# Patient Record
Sex: Female | Born: 1970 | Race: White | Hispanic: No | Marital: Married
Health system: Midwestern US, Academic
[De-identification: ages and names within clinical notes are randomized; demographics above are authoritative.]

---

## 2017-01-30 ENCOUNTER — Encounter: Admit: 2017-01-30 | Discharge: 2017-01-30 | Payer: Commercial Managed Care - HMO

## 2017-01-30 DIAGNOSIS — R0600 Dyspnea, unspecified: ICD-10-CM

## 2017-01-30 DIAGNOSIS — N3010 Interstitial cystitis (chronic) without hematuria: ICD-10-CM

## 2017-01-30 DIAGNOSIS — R51 Headache: ICD-10-CM

## 2017-01-30 DIAGNOSIS — I10 Essential (primary) hypertension: Principal | ICD-10-CM

## 2017-01-30 DIAGNOSIS — K859 Acute pancreatitis without necrosis or infection, unspecified: ICD-10-CM

## 2017-01-30 DIAGNOSIS — J302 Other seasonal allergic rhinitis: ICD-10-CM

## 2017-01-30 DIAGNOSIS — E7800 Pure hypercholesterolemia, unspecified: ICD-10-CM

## 2017-01-30 DIAGNOSIS — Z720 Tobacco use: ICD-10-CM

## 2017-01-30 MED ORDER — CLONAZEPAM 1 MG PO TAB
1 mg | ORAL_TABLET | ORAL | 5 refills | Status: AC | PRN
Start: 2017-01-30 — End: 2017-07-31

## 2017-01-30 MED ORDER — CLONAZEPAM 0.5 MG PO TAB
.75 mg | ORAL_TABLET | Freq: Two times a day (BID) | ORAL | 5 refills | Status: AC
Start: 2017-01-30 — End: 2017-07-31

## 2017-01-30 NOTE — Progress Notes
Date of Service: 01/30/2017    Subjective:             Christina Garrison is a 46 y.o. female who returns for vertigo and headache    Had occipital blocks in February, still has pain in the left GOC still from the injection.    Doing acupuncture every Saturday, this has helped.    Still having episodes of vertigo, triggered by heat    Taking clonazepam 1mg  in the AM, 1 PM, and sometimes 0.5mg  at night.  This is helping.    Hasn't had vestibular therapy    Allergies have also been bad this year for her, working on finding on decongestant therapies    Scheduled for lumbar radiofrequency ablation     History of Present Illness     Review of Systems   Constitutional: Positive for appetite change.   HENT: Positive for congestion, rhinorrhea, sinus pressure and sore throat.    Eyes: Positive for photophobia.   Endocrine: Positive for heat intolerance.   Genitourinary: Positive for flank pain.   Musculoskeletal: Positive for back pain, neck pain and neck stiffness.   Allergic/Immunologic: Positive for environmental allergies.   Neurological: Positive for light-headedness and headaches.   Psychiatric/Behavioral: Positive for agitation. The patient is nervous/anxious.    All other systems reviewed and are negative.        Objective:         ??? Biotin 10,000 mcg cap Take  by mouth twice daily.   ??? brexpiprazole 1 mg tab Take 1 Tab by mouth daily.   ??? buPROPion (WELLBUTRIN) 75 mg tablet Take 75 mg by mouth twice daily.   ??? BYSTOLIC 5 mg tablet TAKE 1 TAB BY MOUTH DAILY.   ??? clonazePAM (KLONOPIN) 0.5 mg tablet Take 1.5 tablets by mouth twice daily.   ??? clonazePAM (KLONOPIN) 1 mg tablet Take 1 tablet by mouth as Needed (For driving).   ??? divalproex (DEPAKOTE SPRINKLE) 125 mg capsule Take 250 mg by mouth twice daily.   ??? ezetimibe (ZETIA) 10 mg tablet TAKE 1 TAB BY MOUTH DAILY.   ??? mirtazapine (REMERON) 15 mg tablet Take 15 mg by mouth at bedtime daily.   ??? MULTIVITAMINS WITH IRON (MULTIVITAMIN WITH IRON PO) Take 1 Tab by mouth twice daily.   ??? omeprazole DR(+) (PRILOSEC) 20 mg capsule Take 20 mg by mouth as Needed.   ??? prochlorperazine maleate (COMPAZINE) 10 mg tablet Take 5-10mg  compazine + benadryl 25mg  + tylenol every 6 hours for acute headache or dizziness   ??? rosuvastatin (CRESTOR) 20 mg tablet TAKE 1 TAB BY MOUTH DAILY.   ??? rosuvastatin (CRESTOR) 40 mg tablet Take 40 mg by mouth daily.   ??? tiZANidine (ZANAFLEX) 2 mg tablet TAKE 2 TABLETS BY MOUTH AT BEDTIME AS NEEDED     Vitals:    01/30/17 1110   BP: 129/85   Pulse: 69   Weight: 106.6 kg (235 lb)   Height: 162.6 cm (64)     Body mass index is 40.34 kg/m???.     Physical Exam    Constitutional: She appears well-developed and well-nourished.   HENT:   Head: Normocephalic and atraumatic.   Eyes: Conjunctivae are normal.   Cardiovascular: Normal rate, regular rhythm and normal heart sounds.    Pulmonary/Chest: Effort normal.   Musculoskeletal:   Cervical paraspinal tenderness, GOC tenderness   Skin: Skin is warm and dry.   Psychiatric: She has a normal mood and affect. Her behavior is normal.  Vitals reviewed.  ???  Neurological:  Mental status: Patient is alert and oriented to time, place, person and situation.  Speech: Fluent, with normal naming, comprehension, articulation and repetition  CN II-XII: Visual fields intact to confrontation, PERRL (4->2), EOMI with multiple trials with some left sided nystagmus that fatigues but symptomatically worsens , facial sensation intact. Symmetrical facial movement. Hearing grossly intact to finger rub. Strong cough, elevates palate, uvula midline. Strong shoulder shrug. Tongue midline.  Motor: (R/L) b/l UE/LE 5/5. Normal tone and bulk. No abnormal movement, fasciculation or pronator drift.  ???  ??? SA  EF  EE  WE  WF  FF  FA  TA  HF  KF  KE  DF  PF  TF  TE    R  5  5  5  5  5  5  5  5  5  5  5  5  5  5  5     L  5  5  5  5  5  5  5  5  5  5  5  5  5  5  5     ???  Sensory: intact light touch.  Without sensory level. Romberg sign absent. Reflexes: (R/L) Bj, Trj, Brj, Knj, Aj. No clonus, hofmann, cross adductor. Babinski negative.  ???  ??? Right Left   Triceps 1 1   Biceps 1 1   Brachioradialis 1 1   Patella 1 1   Ankle 1 1   Plantar flexor flexor   ???  Coordination/ fine movement: normal finger to nose with eyes closed.  Gait: normal. Patient able to walk on heels, toes and in tandem (no significant difficulty with tandem gait)       Assessment and Plan:      Problem   Vertigo    Spells for years of intermittent vertigo, initially positionally provoked, now lasting for only seconds.  Occurs daily currently.  Initially with feeling of L ear fullness and pain.  Has been associated with headaches as well  Does have a h/o falling down stairs hitting her head and neck 20 years ago  MRI wo/w + IAC 10/2012 without significant abnormalities  Neuro-otology workup with likely central cause of dizziness (normal audiogram and mostly normal VEMPs, normal electronystagmography, no canal dehiscence on imaging)  Has undergone PT for vestibular and cervical causes    Current treatment: Clonazepam 1mg  qAM, qPM and 0.5mg  qHS- this has helped, Depakote (for mood), Verapamil (has had some response), Acupuncture (has had some response)    Prior trials: Prozac (SE), Effexor (suicidal thoughts), Topamax (speech difficulties), nortriptyline, Indomethacin (helped headache but GI SE), Magnesium/Riboflavin/coQ10, Propranolol (changed to Bystolic).  Occipital block with poor tolerance due to lasting pain at injection site     Tension Headache    frontal/L eye pressure since her 30s with conjunctival injection/photo/phono.  Able to work through this without any medication.  Sleeps in positions that provoke neck/back pain.  Significant greater occipital tenderness on exam  Improved with treatment of OSA, and after occipital nerve blocks (though poorly tolerated as above)  History of frontal meningocele (resected), head trauma (fell down stairs)    Meds described as above Vertigo  Will continue Clonazepam at current dose, and will refer to vestibular therapy.  Has RFA for low back pain coming up, hopefully this will help her back pain, which will help her sleep, which will help multiple somatic complaints.      RTC in 6 months  Pt seen by and discussed with Dr Genevie Cheshire

## 2017-01-31 ENCOUNTER — Ambulatory Visit: Admit: 2017-01-30 | Discharge: 2017-01-31 | Payer: Commercial Managed Care - HMO

## 2017-01-31 DIAGNOSIS — G44209 Tension-type headache, unspecified, not intractable: Secondary | ICD-10-CM

## 2017-01-31 DIAGNOSIS — R42 Dizziness and giddiness: Principal | ICD-10-CM

## 2017-01-31 NOTE — Assessment & Plan Note
Will continue Clonazepam at current dose, and will refer to vestibular therapy.  Has RFA for low back pain coming up, hopefully this will help her back pain, which will help her sleep, which will help multiple somatic complaints.

## 2017-02-02 ENCOUNTER — Encounter: Admit: 2017-02-02 | Discharge: 2017-02-02 | Payer: Commercial Managed Care - HMO

## 2017-02-05 NOTE — Progress Notes
I have personally interviewed and examined Christina Garrison and reviewed the history, examination, impression, and plan of care as outlined by Dr.Trevor Criselda Peaches, MD. I personally  participated in patient counseling and coordination of care.  I agree with the assessment and plan as documented by Dr. Siri Cole, MD.

## 2017-03-09 ENCOUNTER — Encounter: Admit: 2017-03-09 | Discharge: 2017-03-09 | Payer: Commercial Managed Care - HMO

## 2017-03-09 ENCOUNTER — Ambulatory Visit: Admit: 2017-03-09 | Discharge: 2017-03-10 | Payer: Commercial Managed Care - HMO

## 2017-03-09 DIAGNOSIS — K859 Acute pancreatitis without necrosis or infection, unspecified: ICD-10-CM

## 2017-03-09 DIAGNOSIS — I10 Essential (primary) hypertension: ICD-10-CM

## 2017-03-09 DIAGNOSIS — J302 Other seasonal allergic rhinitis: ICD-10-CM

## 2017-03-09 DIAGNOSIS — N3010 Interstitial cystitis (chronic) without hematuria: ICD-10-CM

## 2017-03-09 DIAGNOSIS — E7800 Pure hypercholesterolemia, unspecified: ICD-10-CM

## 2017-03-09 DIAGNOSIS — R0600 Dyspnea, unspecified: ICD-10-CM

## 2017-03-09 DIAGNOSIS — Z720 Tobacco use: ICD-10-CM

## 2017-03-09 DIAGNOSIS — R51 Headache: ICD-10-CM

## 2017-03-09 DIAGNOSIS — M5137 Other intervertebral disc degeneration, lumbosacral region: Secondary | ICD-10-CM

## 2017-03-09 DIAGNOSIS — M5416 Radiculopathy, lumbar region: Principal | ICD-10-CM

## 2017-03-09 MED ORDER — BUPIVACAINE (PF) 0.5 % (5 MG/ML) IJ SOLN
6 mL | Freq: Once | INTRAMUSCULAR | 0 refills | Status: CP
Start: 2017-03-09 — End: ?
  Administered 2017-03-09: 19:00:00 6 mL via INTRAMUSCULAR

## 2017-03-09 MED ORDER — BUPIVACAINE (PF) 0.25 % (2.5 MG/ML) IJ SOLN
6 mL | Freq: Once | INTRAMUSCULAR | 0 refills | Status: DC
Start: 2017-03-09 — End: 2017-03-09

## 2017-03-09 NOTE — Progress Notes
[facility]  INTERVENTIONAL PAIN PROCEDURE HISTORY AND PHYSICAL    Chief Complaint   Patient presents with   ??? Lower Back - Pain   ??? Procedure     MBB#1       HISTORY OF PRESENT ILLNESS:  LBP, axial. No radicular pain.    Patient denies fevers, chills, infection, bleeding issues, anticoagulants, new muscle weakness, numbness, tingling, bowel/bladder incontinence, or saddle anesthesia.      Past Medical History:   Diagnosis Date   ??? Dyspnea    ??? Generalized headaches    ??? High cholesterol    ??? HTN (hypertension)    ??? Hypertension 11/21/2010   ??? Interstitial cystitis    ??? Pancreatitis 11/21/2010   ??? Seasonal allergic reaction    ??? Tobacco abuse        Past Surgical History:   Procedure Laterality Date   ??? SINUS SURGERY  2004   ??? PR LAPS GSTRC RSTRICTIV PX LONGITUDINAL GASTRECTOMY  03/2014   ??? ELECTROCARDIOGRAM     ??? HX APPENDECTOMY     ??? HX CHOLECYSTECTOMY     ??? HX PARTIAL HYSTERECTOMY         family history includes Allergy-severe in her mother; Asthma in her mother; Cancer (age of onset: 51) in her sister; Coronary Artery Disease in her father and mother; Diabetes in her mother and sister; Heart Attack in her father and mother; Heart Disease (age of onset: 42) in her sister; High Cholesterol in her father, mother, and sister; Hypertension in her brother, father, mother, and sister; Tumor in her sister; Tumor (age of onset: 51) in her sister.    Social History     Social History   ??? Marital status: Married     Spouse name: N/A   ??? Number of children: N/A   ??? Years of education: N/A     Occupational History   ??? Not on file.     Social History Main Topics   ??? Smoking status: Former Smoker     Packs/day: 0.50     Years: 25.00     Types: Cigarettes     Quit date: 05/02/2012   ??? Smokeless tobacco: Never Used      Comment: Cessation Counseling given 11/29/10   ??? Alcohol use No   ??? Drug use: No   ??? Sexual activity: Not Currently     Birth control/ protection: Surgical     Other Topics Concern   ??? Not on file Social History Narrative   ??? No narrative on file       Allergies   Allergen Reactions   ??? Diovan [Valsartan] HIVES   ??? Effexor [Venlafaxine] SEE COMMENTS     Suicidal thoughts   ??? Prozac [Fluoxetine] NAUSEA AND VOMITING and ANXIETY   ??? Topamax [Topiramate] SEE COMMENTS     Feels like she is going to pass out and speech messed up       Vitals:    03/09/17 1301   BP: 135/84   Pulse: 95   Resp: 16   Temp: 36.5 ???C (97.7 ???F)   TempSrc: Oral   SpO2: 97%   Weight: 81.6 kg (180 lb)   Height: 162.6 cm (64)       REVIEW OF SYSTEMS: 10 point ROS obtained and negative       PHYSICAL EXAM:    General: The patient is a well-developed, overweight 46 y.o. female in no acute distress.   HEENT: Head is normocephalic and atraumatic.  Pupils are equal and reactive to light bilaterally.   Cardiac: Based on palpation, pulse appears to be regular rate and rhythm.   Pulmonary: The patient has unlabored respirations and bilateral symmetric chest excursion.   Abdomen: Soft, nontender, and nondistended. There is no rebound or guarding.   Extremities: No clubbing, cyanosis, or edema.     Neurologic:   The patient is alert and oriented times 3.     Musculoskeletal:     L-Spine   There is mod low lumbar paraspinal tenderness. Paraspinal muscle tone is increased.  Facet loading is positive.  ROM with flexion, extension, rotation, and lateral bending is intact.  SLR is negative bilaterally.        IMPRESSION:    1. Spondylosis of lumbosacral region without myelopathy or radiculopathy    2. DDD (degenerative disc disease), lumbosacral    3. Facet arthropathy (HCC)         PLAN: Other Bilateral L3-5 MBB

## 2017-03-09 NOTE — Procedures
Attending Surgeon: Evelina Bucy, MD    Anesthesia: Local    Pre-Procedure Diagnosis:   1. Spondylosis of lumbosacral region without myelopathy or radiculopathy    2. DDD (degenerative disc disease), lumbosacral    3. Facet arthropathy (HCC)        Post-Procedure Diagnosis:   1. Spondylosis of lumbosacral region without myelopathy or radiculopathy    2. DDD (degenerative disc disease), lumbosacral    3. Facet arthropathy (HCC)        MBB/Facet LMBR/SAC  Procedure: medial branch block    Laterality: bilateral  Location: lumbar - L3-4, L4-5 and L5-S1      Consent:   Consent obtained: written  Consent given by: patient  Risks discussed: allergic reaction, bleeding, bruising, infection, never damage, no change or worsening in pain, pneumo thorax, reaction to medication, seizure, swelling and weakness  Alternatives discussed: alternative treatment, delayed treatment and no treatment  Discussed with patient the purpose of the treatment/procedure, other ways of treating my condition, including no treatment/ procedure and the risks and benefits of the alternatives. Patient has decided to proceed with treatment/procedure.        Universal Protocol:  Relevant documents: relevant documents present and verified  Site marked: the operative site was marked  Patient identity confirmed: Patient identify confirmed verbally with patient.      Time out: Immediately prior to procedure a time out was called to verify the correct patient, procedure, equipment, support staff and site/side marked as required      Procedures Details:   Indications: pain   Prep: chlorhexidine  Patient position: prone  Estimated Blood Loss: minimal  Specimens: none  Number of Levels: 3  Guidance: fluoroscopy  Needle and Epidural Catheter: quincke  Needle size: 25 G  Injection procedure: Incremental injection and Negative aspiration for blood  Patient tolerance: Patient tolerated the procedure well with no immediate complications. Pressure was applied, and hemostasis was accomplished.  Outcome: Pain unchanged  Comments: LUMBAR MEDIAL BRANCH BLOCKS UNDER FLUOROSCOPY    PROCEDURE:  1) bilateral L3-L5 medial branch blocks   2) Fluoroscopic needle guidance    REASON FOR PROCEDURE:     Spondylosis without myelopathy or radiculopathy, DDD (lumbosacral), Lumbar arthropathy, Facet arthropathy, and axial back pain    PHYSICIAN: Evelina Bucy, MD    MEDICATIONS INJECTED: Bupivacaine 0.5% (0.7 ml each level)    LOCAL ANESTHETIC INJECTED: 0.5 mL of 1% lidocaine per site    SEDATION MEDICATIONS: None    ESTIMATED BLOOD LOSS: None    SPECIMENS REMOVED: None    COMPLICATIONS: None    TECHNIQUE: Time-out was taken to identify the correct patient, procedure and side prior to starting the procedure. Lying in a prone position, the patient was prepped and draped in the usual sterile fashion using DuraPrep and a fenestrated drape. Each site was identified under fluoroscopy. Local anesthetic was given by raising a wheal and going down to the hub of a 27-gauge 1.25-inch needle. The 25-gauge 3.5-inch Quincke needle was advanced to the anatomic location of each medial branch at the junction of the superior articular process and transverse process utilizing intermittent fluoroscopy. Medication was then injected slowly.    The procedure was completed without complications and was tolerated well. The patient was monitored after the procedure. The patient (or responsible party) was given post-procedure and  discharge instructions to follow at home. The patient was discharged in stable condition.     Note: The patient has been instructed to call to inform us what percentage  of pain relief was obtained after the facet nerve blocks from today. A pain diary was provided. The patient was also instructed to do the activities that would normally worsen the pain.           Estimated blood loss: none or minimal  Specimens: none Patient tolerated the procedure well with no immediate complications. Pressure was applied, and hemostasis was accomplished.

## 2017-03-10 ENCOUNTER — Encounter: Admit: 2017-03-10 | Discharge: 2017-03-10 | Payer: Commercial Managed Care - HMO

## 2017-03-10 DIAGNOSIS — I10 Essential (primary) hypertension: ICD-10-CM

## 2017-03-10 DIAGNOSIS — E7800 Pure hypercholesterolemia, unspecified: ICD-10-CM

## 2017-03-10 DIAGNOSIS — M4690 Unspecified inflammatory spondylopathy, site unspecified: ICD-10-CM

## 2017-03-10 DIAGNOSIS — M47817 Spondylosis without myelopathy or radiculopathy, lumbosacral region: Principal | ICD-10-CM

## 2017-03-10 DIAGNOSIS — Z87891 Personal history of nicotine dependence: ICD-10-CM

## 2017-03-10 DIAGNOSIS — M5137 Other intervertebral disc degeneration, lumbosacral region: ICD-10-CM

## 2017-03-11 NOTE — Telephone Encounter
Patient called to report pain diary status post Facet Joint Injection  MBB L3-5 #1    Prior to procedure 5/10  Location: lower back  Procedure completed approximately 1500 (per patient report)    Pre-procedure pain level returned at next day    Experienced 80% of relief for 4 hours in lower back (location). Pain above injection site did not improve--reviewed use of MBBs. May need to focus on this area of pain after lower back pain.     Also, the patient reports that the numbing medicine doesn't seem to work on her left side which was similar to how she felt when having trigger point injections. The right side was numb both times, but she had trouble with the injection sites on the left side only.   She enjoyed how the "The nurses and doctor were very nice, professional and helpful."

## 2017-03-13 ENCOUNTER — Encounter: Admit: 2017-03-13 | Discharge: 2017-03-13 | Payer: Commercial Managed Care - HMO

## 2017-03-13 NOTE — Telephone Encounter
From: Janann Colonel [[email or web address]]   Sent: Friday, March 13, 2017 10:00 AM  To: Rexene Agent  Subject: [External] Lumbar Shots    Good morning Wells Guiles,    Not to be a nuisance,  but I am a little apprehensive about getting the 2nd round of shots on Monday.  My lower back actually feels more pronounced pressure more consistently whereas it would come and go and just be a dull ache most of the time.    I do not want to get another round of shots and it hurt worse for weeks or longer. (Like my head did)  I'm wondering if these past shots are causing discomfort, won't the 2nd round be the same or what am I to expect out of them?     All I want is some relief for my back.  I do not mind going through the steps to get it,  but I'd prefer to feel comfortable with the procedure and get some kind of relief as well.    Would it be possible to push the shots back to the week after next and let me see if my back starts easing up?  Also, if you have time to call after [phone] to discuss it would be appreciated.   I know its Friday and you may take off early on fridays.   If so, no worries and have a good weekend.      Thank you very much for hearing me out and for ALL your assistance.     Christina Garrison   1971-01-04  [phone] Phone

## 2017-03-16 ENCOUNTER — Encounter: Admit: 2017-03-16 | Discharge: 2017-03-16 | Payer: Commercial Managed Care - HMO

## 2017-03-16 DIAGNOSIS — M5416 Radiculopathy, lumbar region: Principal | ICD-10-CM

## 2017-04-06 ENCOUNTER — Encounter: Admit: 2017-04-06 | Discharge: 2017-04-06 | Payer: Commercial Managed Care - HMO

## 2017-04-06 ENCOUNTER — Ambulatory Visit: Admit: 2017-04-06 | Discharge: 2017-04-06 | Payer: Commercial Managed Care - HMO

## 2017-04-06 DIAGNOSIS — R51 Headache: ICD-10-CM

## 2017-04-06 DIAGNOSIS — Z720 Tobacco use: ICD-10-CM

## 2017-04-06 DIAGNOSIS — N3010 Interstitial cystitis (chronic) without hematuria: ICD-10-CM

## 2017-04-06 DIAGNOSIS — D485 Neoplasm of uncertain behavior of skin: Principal | ICD-10-CM

## 2017-04-06 DIAGNOSIS — E7800 Pure hypercholesterolemia, unspecified: ICD-10-CM

## 2017-04-06 DIAGNOSIS — I10 Essential (primary) hypertension: Principal | ICD-10-CM

## 2017-04-06 DIAGNOSIS — D229 Melanocytic nevi, unspecified: ICD-10-CM

## 2017-04-06 DIAGNOSIS — J302 Other seasonal allergic rhinitis: ICD-10-CM

## 2017-04-06 DIAGNOSIS — R0600 Dyspnea, unspecified: ICD-10-CM

## 2017-04-06 DIAGNOSIS — W891XXA Exposure to tanning bed, initial encounter: ICD-10-CM

## 2017-04-06 DIAGNOSIS — K859 Acute pancreatitis without necrosis or infection, unspecified: ICD-10-CM

## 2017-04-06 NOTE — Progress Notes
ATTESTATION    I personally performed the key portions of the E/M visit, discussed case with resident and concur with resident documentation of history, physical exam, assessment, and treatment plan unless otherwise noted.    I performed the key components of the excisional biopsy procedure including site identification, discussion with patient, excision type, choice, final closure, and post-procedure instructions and was present during the entire procedure.      Staff name:  Mechele Collin, MD Date:  04/06/2017

## 2017-04-06 NOTE — Progress Notes
Pathology specimen was collected and confirmed the requisition and specimen container are correct. Specimen placed in bag with requisition and sent to the pathology department. Arrow Emmerich, LPN

## 2017-04-07 NOTE — Progress Notes
Please send the benign biopsy letter. Thanks.

## 2017-04-07 NOTE — Progress Notes
Please send benign letter. Thank you.

## 2017-04-08 ENCOUNTER — Encounter: Admit: 2017-04-08 | Discharge: 2017-04-08 | Payer: Commercial Managed Care - HMO

## 2017-04-10 ENCOUNTER — Encounter: Admit: 2017-04-10 | Discharge: 2017-04-10 | Payer: Commercial Managed Care - HMO

## 2017-04-10 MED ORDER — MUPIROCIN 2 % TP OINT
Freq: Two times a day (BID) | TOPICAL | 0 refills | 11.00000 days | Status: AC
Start: 2017-04-10 — End: 2018-01-05

## 2017-04-10 NOTE — Progress Notes
Patient reported punch biopsy site on left upper back done on Monday was swollen and a little tender radiating to her shoulder. Offered patient a nurse visit, mupirocin ointment, or Doxycycline tablets for 5 days. Patient opted for the mupirocin ointment. Order for Mupirocin has been sent to patient's preferred pharmacy. Reminded patient that stitches will need to come out anytime from the 16th-20th (10-14 days from the biopsy date). Patient stated she took home a suture removal kit and will have someone take them out for her. Encouraged patient to call the clinic if she has any trouble getting stitches taken out or if she has any other concerns regarding the biopsy site. Celene Kras, LPN

## 2017-04-21 ENCOUNTER — Encounter: Admit: 2017-04-21 | Discharge: 2017-04-21 | Payer: Commercial Managed Care - HMO

## 2017-04-21 MED ORDER — DOXYCYCLINE HYCLATE 100 MG PO TAB
ORAL_TABLET | Freq: Two times a day (BID) | ORAL | 0 refills | 8.00000 days | Status: AC
Start: 2017-04-21 — End: 2017-05-25

## 2017-04-21 NOTE — Progress Notes
Patient in clinic today for suture removal to left upper back. Patient reported tenderness and has been applying mupirocin ointment. No drainage, warmth, or swelling noted. Vaseline and a bandaid applied. Patient tolerated well. She stated she would like to oral antibiotics. Reached out to Dr. Mina Marble regarding oral antibiotic and she prescribed Doxycycline 100mg  BID for 7 days. Order for Doxycycline has been sent to patient's preferred pharmacy and patient has been notified. Celene Kras, LPN

## 2017-05-25 ENCOUNTER — Ambulatory Visit: Admit: 2017-05-25 | Discharge: 2017-05-26 | Payer: Commercial Managed Care - HMO

## 2017-05-25 ENCOUNTER — Encounter: Admit: 2017-05-25 | Discharge: 2017-05-25 | Payer: Commercial Managed Care - HMO

## 2017-05-25 DIAGNOSIS — K859 Acute pancreatitis without necrosis or infection, unspecified: ICD-10-CM

## 2017-05-25 DIAGNOSIS — R51 Headache: ICD-10-CM

## 2017-05-25 DIAGNOSIS — J302 Other seasonal allergic rhinitis: ICD-10-CM

## 2017-05-25 DIAGNOSIS — M47819 Spondylosis without myelopathy or radiculopathy, site unspecified: ICD-10-CM

## 2017-05-25 DIAGNOSIS — M5416 Radiculopathy, lumbar region: Principal | ICD-10-CM

## 2017-05-25 DIAGNOSIS — I10 Essential (primary) hypertension: Secondary | ICD-10-CM

## 2017-05-25 DIAGNOSIS — R0600 Dyspnea, unspecified: ICD-10-CM

## 2017-05-25 DIAGNOSIS — Z720 Tobacco use: ICD-10-CM

## 2017-05-25 DIAGNOSIS — E7800 Pure hypercholesterolemia, unspecified: ICD-10-CM

## 2017-05-25 DIAGNOSIS — N3010 Interstitial cystitis (chronic) without hematuria: ICD-10-CM

## 2017-05-25 MED ORDER — BUPIVACAINE (PF) 0.5 % (5 MG/ML) IJ SOLN
6 mL | Freq: Once | INTRAMUSCULAR | 0 refills | Status: CP
Start: 2017-05-25 — End: ?
  Administered 2017-05-25: 20:00:00 6 mL via INTRAMUSCULAR

## 2017-05-25 NOTE — Progress Notes
[facility]  INTERVENTIONAL PAIN PROCEDURE HISTORY AND PHYSICAL    Chief Complaint   Patient presents with   ??? Lower Back - Pain       HISTORY OF PRESENT ILLNESS:  LBP, axial. No radicular pain.    Patient denies fevers, chills, infection, bleeding issues, anticoagulants, new muscle weakness, numbness, tingling, bowel/bladder incontinence, or saddle anesthesia.      Past Medical History:   Diagnosis Date   ??? Dyspnea    ??? Generalized headaches    ??? High cholesterol    ??? HTN (hypertension)    ??? Hypertension 11/21/2010   ??? Interstitial cystitis    ??? Pancreatitis 11/21/2010   ??? Seasonal allergic reaction    ??? Tobacco abuse        Past Surgical History:   Procedure Laterality Date   ??? SINUS SURGERY  2004   ??? PR LAPS GSTRC RSTRICTIV PX LONGITUDINAL GASTRECTOMY  03/2014   ??? ELECTROCARDIOGRAM     ??? HX APPENDECTOMY     ??? HX CHOLECYSTECTOMY     ??? HX PARTIAL HYSTERECTOMY         family history includes Allergy-severe in her mother; Asthma in her mother; Cancer (age of onset: 3) in her sister; Coronary Artery Disease in her father and mother; Diabetes in her mother and sister; Heart Attack in her father and mother; Heart Disease (age of onset: 6) in her sister; High Cholesterol in her father, mother, and sister; Hypertension in her brother, father, mother, and sister; Tumor in her sister; Tumor (age of onset: 52) in her sister.    Social History     Social History   ??? Marital status: Married     Spouse name: N/A   ??? Number of children: N/A   ??? Years of education: N/A     Occupational History   ??? Not on file.     Social History Main Topics   ??? Smoking status: Former Smoker     Packs/day: 0.50     Years: 25.00     Types: Cigarettes     Quit date: 05/02/2012   ??? Smokeless tobacco: Never Used      Comment: Cessation Counseling given 11/29/10   ??? Alcohol use No   ??? Drug use: No   ??? Sexual activity: Not Currently     Birth control/ protection: Surgical     Other Topics Concern   ??? Not on file     Social History Narrative ??? No narrative on file       Allergies   Allergen Reactions   ??? Strawberry HIVES   ??? Diovan [Valsartan] HIVES   ??? Effexor [Venlafaxine] SEE COMMENTS     Suicidal thoughts   ??? Prozac [Fluoxetine] NAUSEA AND VOMITING and ANXIETY   ??? Topamax [Topiramate] SEE COMMENTS     Feels like she is going to pass out and speech messed up       Vitals:    05/25/17 1402   BP: (!) 146/93   Pulse: 73   Resp: 18   SpO2: 96%   Weight: 81.6 kg (180 lb)   Height: 162.6 cm (64)       REVIEW OF SYSTEMS: 10 point ROS obtained and negative       PHYSICAL EXAM:    General: The patient is a well-developed, overweight 46 y.o. female in no acute distress.   HEENT: Head is normocephalic and atraumatic. Pupils are equal and reactive to light bilaterally.   Cardiac: Based  on palpation, pulse appears to be regular rate and rhythm.   Pulmonary: The patient has unlabored respirations and bilateral symmetric chest excursion.   Abdomen: Soft, nontender, and nondistended. There is no rebound or guarding.   Extremities: No clubbing, cyanosis, or edema.     Neurologic:   The patient is alert and oriented times 3.     Musculoskeletal:     L-Spine   There is mod low lumbar paraspinal tenderness. Paraspinal muscle tone is increased.  Facet loading is positive.  ROM with flexion, extension, rotation, and lateral bending is intact.  SLR is negative bilaterally.        IMPRESSION:    1. Spondylosis of lumbosacral region without myelopathy or radiculopathy    2. DDD (degenerative disc disease), lumbosacral    3. Facet arthropathy         PLAN: Other Bilateral L3-5 MBB

## 2017-05-25 NOTE — Procedures
Attending Surgeon: Evelina Bucy, MD    Anesthesia: Local    Pre-Procedure Diagnosis:   1. Spondylosis of lumbosacral region without myelopathy or radiculopathy    2. DDD (degenerative disc disease), lumbosacral    3. Facet arthropathy        Post-Procedure Diagnosis:   1. Spondylosis of lumbosacral region without myelopathy or radiculopathy    2. DDD (degenerative disc disease), lumbosacral    3. Facet arthropathy        MBB/Facet LMBR/SAC  Procedure: medial branch block    Laterality: bilateral  Location: lumbar - L3-4, L4-5 and L5-S1      Consent:   Consent obtained: written  Consent given by: patient  Risks discussed: allergic reaction, bleeding, bruising, infection, never damage, no change or worsening in pain, pneumo thorax, reaction to medication, seizure, swelling and weakness  Alternatives discussed: alternative treatment, delayed treatment and no treatment  Discussed with patient the purpose of the treatment/procedure, other ways of treating my condition, including no treatment/ procedure and the risks and benefits of the alternatives. Patient has decided to proceed with treatment/procedure.        Universal Protocol:  Relevant documents: relevant documents present and verified  Site marked: the operative site was marked  Patient identity confirmed: Patient identify confirmed verbally with patient.      Time out: Immediately prior to procedure a time out was called to verify the correct patient, procedure, equipment, support staff and site/side marked as required      Procedures Details:   Indications: pain   Prep: chlorhexidine  Patient position: prone  Estimated Blood Loss: minimal  Specimens: none  Number of Levels: 3  Guidance: fluoroscopy  Needle and Epidural Catheter: quincke  Needle size: 25 G  Injection procedure: Incremental injection and Negative aspiration for blood  Patient tolerance: Patient tolerated the procedure well with no immediate complications. Pressure was applied, and hemostasis was accomplished.  Outcome: Pain unchanged  Comments:   LUMBAR MEDIAL BRANCH BLOCKS UNDER FLUOROSCOPY    PROCEDURE:  1) bilateral L3-L5 medial branch blocks   2) Fluoroscopic needle guidance    REASON FOR PROCEDURE:     Spondylosis without myelopathy or radiculopathy, DDD (lumbosacral), Lumbar arthropathy, Facet arthropathy, and axial back pain    PHYSICIAN: Evelina Bucy, MD    MEDICATIONS INJECTED: Bupivacaine 0.5% (0.7 ml each level)    LOCAL ANESTHETIC INJECTED: 0.5 mL of 1% lidocaine per site    SEDATION MEDICATIONS: None    ESTIMATED BLOOD LOSS: None    SPECIMENS REMOVED: None    COMPLICATIONS: None    TECHNIQUE: Time-out was taken to identify the correct patient, procedure and side prior to starting the procedure. Lying in a prone position, the patient was prepped and draped in the usual sterile fashion using DuraPrep and a fenestrated drape. Each site was identified under fluoroscopy. Local anesthetic was given by raising a wheal and going down to the hub of a 27-gauge 1.25-inch needle. The 25-gauge 3.5-inch Quincke needle was advanced to the anatomic location of each medial branch at the junction of the superior articular process and transverse process utilizing intermittent fluoroscopy. Medication was then injected slowly.    The procedure was completed without complications and was tolerated well. The patient was monitored after the procedure. The patient (or responsible party) was given post-procedure and  discharge instructions to follow at home. The patient was discharged in stable condition.     Note: The patient has been instructed to call to inform us what percentage  of pain relief was obtained after the facet nerve blocks from today. A pain diary was provided. The patient was also instructed to do the activities that would normally worsen the pain.           Estimated blood loss: none or minimal  Specimens: none Patient tolerated the procedure well with no immediate complications. Pressure was applied, and hemostasis was accomplished.

## 2017-05-26 ENCOUNTER — Ambulatory Visit: Admit: 2017-05-25 | Discharge: 2017-05-26 | Payer: Commercial Managed Care - HMO

## 2017-05-26 DIAGNOSIS — M47817 Spondylosis without myelopathy or radiculopathy, lumbosacral region: ICD-10-CM

## 2017-05-26 DIAGNOSIS — E7800 Pure hypercholesterolemia, unspecified: ICD-10-CM

## 2017-05-26 DIAGNOSIS — M5137 Other intervertebral disc degeneration, lumbosacral region: Principal | ICD-10-CM

## 2017-05-26 DIAGNOSIS — Z87891 Personal history of nicotine dependence: ICD-10-CM

## 2017-05-26 DIAGNOSIS — I10 Essential (primary) hypertension: ICD-10-CM

## 2017-05-26 DIAGNOSIS — M1288 Other specific arthropathies, not elsewhere classified, other specified site: ICD-10-CM

## 2017-05-27 ENCOUNTER — Encounter: Admit: 2017-05-27 | Discharge: 2017-05-27 | Payer: Commercial Managed Care - HMO

## 2017-06-25 ENCOUNTER — Encounter: Admit: 2017-06-25 | Discharge: 2017-06-25 | Payer: Commercial Managed Care - HMO

## 2017-06-26 MED ORDER — BYSTOLIC 5 MG PO TAB
ORAL_TABLET | Freq: Every day | ORAL | 1 refills | 60.00000 days | Status: AC
Start: 2017-06-26 — End: 2017-12-25

## 2017-06-30 ENCOUNTER — Encounter: Admit: 2017-06-30 | Discharge: 2017-06-30 | Payer: Commercial Managed Care - HMO

## 2017-07-01 NOTE — Telephone Encounter
Patient called to report pain diary status post Facet Joint Injection  MBB L3-5 #2    Prior to procedure           5/10                   Location: lower back, tailbone  Procedure completed approximately 1500 (per patient report)    Pre-procedure pain level returned at next day    Experienced 80% of relief for 1.5-2 hours in lower back (location).

## 2017-07-07 ENCOUNTER — Encounter: Admit: 2017-07-07 | Discharge: 2017-07-07 | Payer: Commercial Managed Care - HMO

## 2017-07-07 DIAGNOSIS — Z1239 Encounter for other screening for malignant neoplasm of breast: Principal | ICD-10-CM

## 2017-07-27 ENCOUNTER — Encounter: Admit: 2017-07-27 | Discharge: 2017-07-27 | Payer: Commercial Managed Care - HMO

## 2017-07-31 ENCOUNTER — Encounter: Admit: 2017-07-31 | Discharge: 2017-07-31 | Payer: Commercial Managed Care - HMO

## 2017-07-31 ENCOUNTER — Ambulatory Visit: Admit: 2017-07-31 | Discharge: 2017-07-31 | Payer: Commercial Managed Care - HMO

## 2017-07-31 DIAGNOSIS — Z720 Tobacco use: ICD-10-CM

## 2017-07-31 DIAGNOSIS — Z87891 Personal history of nicotine dependence: ICD-10-CM

## 2017-07-31 DIAGNOSIS — G4733 Obstructive sleep apnea (adult) (pediatric): ICD-10-CM

## 2017-07-31 DIAGNOSIS — R42 Dizziness and giddiness: ICD-10-CM

## 2017-07-31 DIAGNOSIS — K859 Acute pancreatitis without necrosis or infection, unspecified: ICD-10-CM

## 2017-07-31 DIAGNOSIS — I10 Essential (primary) hypertension: ICD-10-CM

## 2017-07-31 DIAGNOSIS — Z1231 Encounter for screening mammogram for malignant neoplasm of breast: Principal | ICD-10-CM

## 2017-07-31 DIAGNOSIS — E7800 Pure hypercholesterolemia, unspecified: ICD-10-CM

## 2017-07-31 DIAGNOSIS — R0600 Dyspnea, unspecified: ICD-10-CM

## 2017-07-31 DIAGNOSIS — R51 Headache: ICD-10-CM

## 2017-07-31 DIAGNOSIS — J302 Other seasonal allergic rhinitis: ICD-10-CM

## 2017-07-31 DIAGNOSIS — Z8249 Family history of ischemic heart disease and other diseases of the circulatory system: ICD-10-CM

## 2017-07-31 DIAGNOSIS — N3010 Interstitial cystitis (chronic) without hematuria: ICD-10-CM

## 2017-07-31 MED ORDER — CLONAZEPAM 0.5 MG PO TAB
.75 mg | ORAL_TABLET | Freq: Two times a day (BID) | ORAL | 5 refills | Status: AC
Start: 2017-07-31 — End: 2017-11-27

## 2017-07-31 MED ORDER — CLONAZEPAM 1 MG PO TAB
1 mg | ORAL_TABLET | ORAL | 5 refills | Status: AC | PRN
Start: 2017-07-31 — End: 2017-11-27

## 2017-07-31 NOTE — Progress Notes
Date of Service: 07/31/2017    Subjective:             Christina Garrison is a 46 y.o. female who returns for vertigo. Had been doing better since last visit but recently having more symptoms.    Hasn't done vestibular therapy (lost the Rx), did acupuncture for weeks which helped.    Her back is her main issues, discussing an ablation with pain management.  Had injections which numbed the opposite side, had partial temporary relief with the 2nd round.  Now scheduled for ablation christmas eve    Had an episode while driving to [employer] where she felt as if my head was a snow globe that someone had shaken up.  Pulled over for a few minutes, and then felt better.  Was able to drive home.    Still having similar episodes a few times per week, always worse first thing in the morning, can be triggered by emotions, lasts seconds to minutes.  Still can't tolerate CPAP.  Only had 1 episode of vertigo that was brief and resolved spontaneously.    Taking clonazepam 1mg  AM, 1mg  if she is driving (around 3PM). Believes this is still helping.    Falling asleep at 0200, waking at 0500 (sleeps on her stomach).  Has difficulties with racing thoughts, vivid dreams.  She will sit up and do motions with her arms in her sleep, will talk in her sleep. Doesn't think she hallucinates.  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  States that she had an argument with her husband recently, was very upset, felt as if her body hit a brick wall and she couldn't move but her mind was still awake.  Taking the mirtazapine at 0930, this is not helping her get to sleep.  Still watches TV before bed.  Has 3 dogs that sleep in the bed with her and 2 more on the floor.     Unchanged left frontal pain    Has had worse stress lately (difficulty in her marriage, brother recently dx with cancer)         History of Present Illness  Past Medical History:   Diagnosis Date   ??? Dyspnea    ??? Generalized headaches    ??? High cholesterol    ??? HTN (hypertension) ??? Hypertension 11/21/2010   ??? Interstitial cystitis    ??? Pancreatitis 11/21/2010   ??? Seasonal allergic reaction    ??? Tobacco abuse      Past Surgical History:   Procedure Laterality Date   ??? SINUS SURGERY  2004   ??? PR LAPS GSTRC RSTRICTIV PX LONGITUDINAL GASTRECTOMY  03/2014   ??? ELECTROCARDIOGRAM     ??? HX APPENDECTOMY     ??? HX CHOLECYSTECTOMY     ??? HX PARTIAL HYSTERECTOMY       Family History   Problem Relation Age of Onset   ??? Coronary Artery Disease Mother    ??? Allergy-severe Mother    ??? Diabetes Mother    ??? Heart Attack Mother    ??? High Cholesterol Mother    ??? Hypertension Mother    ??? Asthma Mother    ??? Coronary Artery Disease Father    ??? Heart Attack Father    ??? High Cholesterol Father    ??? Hypertension Father    ??? Hypertension Brother    ??? Diabetes Sister    ??? High Cholesterol Sister    ??? Hypertension Sister    ??? Tumor Sister    ???  Cancer Sister 9        Brain, deceased   ??? Tumor Sister 50        2 in Brain   ??? Heart Disease Sister 72     Social History     Social History   ??? Marital status: Married     Spouse name: N/A   ??? Number of children: N/A   ??? Years of education: N/A     Social History Main Topics   ??? Smoking status: Former Smoker     Packs/day: 0.50     Years: 25.00     Types: Cigarettes     Quit date: 05/02/2012   ??? Smokeless tobacco: Never Used      Comment: Cessation Counseling given 11/29/10   ??? Alcohol use No   ??? Drug use: No   ??? Sexual activity: Not Currently     Birth control/ protection: Surgical     Other Topics Concern   ??? Not on file     Social History Narrative   ??? No narrative on file            Review of Systems   HENT: Positive for rhinorrhea, sinus pressure and sore throat.    Eyes: Positive for photophobia.   Endocrine: Positive for cold intolerance and heat intolerance.   Genitourinary: Positive for flank pain.   Musculoskeletal: Positive for neck pain and neck stiffness.   Neurological: Positive for dizziness, light-headedness and headaches. Psychiatric/Behavioral: The patient is nervous/anxious.    All other systems reviewed and are negative.        Objective:         ??? Biotin 10,000 mcg cap Take  by mouth twice daily.   ??? brexpiprazole 1 mg tab Take 1 Tab by mouth daily.   ??? buPROPion (WELLBUTRIN) 75 mg tablet Take 75 mg by mouth twice daily.   ??? BYSTOLIC 5 mg tablet TAKE 1 TAB BY MOUTH DAILY.   ??? clonazePAM (KLONOPIN) 0.5 mg tablet Take 1.5 tablets by mouth twice daily.   ??? clonazePAM (KLONOPIN) 1 mg tablet Take one tablet by mouth as Needed (For driving).   ??? divalproex (DEPAKOTE SPRINKLE) 125 mg capsule Take 250 mg by mouth twice daily.   ??? ezetimibe (ZETIA) 10 mg tablet TAKE 1 TAB BY MOUTH DAILY.   ??? mirtazapine (REMERON) 15 mg tablet Take 15 mg by mouth at bedtime daily.   ??? MULTIVITAMINS WITH IRON (MULTIVITAMIN WITH IRON PO) Take 1 Tab by mouth twice daily.   ??? mupirocin (BACTROBAN) 2 % topical ointment Apply topically to affected area on left upper back twice daily.   ??? omeprazole DR(+) (PRILOSEC) 20 mg capsule Take 20 mg by mouth as Needed.   ??? prochlorperazine maleate (COMPAZINE) 10 mg tablet Take 5-10mg  compazine + benadryl 25mg  + tylenol every 6 hours for acute headache or dizziness   ??? rosuvastatin (CRESTOR) 40 mg tablet Take 40 mg by mouth daily.   ??? tiZANidine (ZANAFLEX) 2 mg tablet TAKE 2 TABLETS BY MOUTH AT BEDTIME AS NEEDED     Vitals:    07/31/17 0851   BP: 127/84   Pulse: 73   Weight: 99.1 kg (218 lb 6.4 oz)   Height: 162.6 cm (64)     Body mass index is 37.49 kg/m???.     Physical Exam  Constitutional: She appears well-developed???and well-nourished.   HENT:   Head: Normocephalic???and atraumatic.   Eyes: Conjunctivae???are normal.   Fundoscopic exam with clear disc margins, no  atrophy, no retinal changes, no vascular abnormalities  Cardiovascular: Normal rate, regular rhythm???and normal heart sounds. ???  Pulmonary/Chest: Effort normal.   Musculoskeletal:   Skin: Skin is warm???and dry. Psychiatric: She appears more depressed and anxious than I have seen her in the past  Vitals???reviewed.  ???  Neurological:  Mental status: Patient is alert and oriented to time, place, person and situation.  Speech:???Fluent, with normal naming, comprehension, articulation and repetition  CN II-XII: Visual fields intact to confrontation, PERRL (5->2), EOMI with multiple trials with some left sided nystagmus that fatigues but symptomatically worsens , facial sensation intact. Symmetrical facial movement. Hearing grossly intact to finger rub. Strong cough, elevates palate, uvula midline. Strong shoulder shrug. Tongue midline.  Motor: (R/L) b/l UE/LE 5/5. Normal tone and bulk. No abnormal movement, fasciculation or pronator drift.  ???  ??? SA  EF  EE  FF  FA  TA  HF  KF  KE  DF  PF  TF  TE    R  5  5  5  5  5  5  5  5  5  5  5  5  5     L  5  5  5  5  5  5  5  5  5  5  5  5  5     ???  Sensory: intact light touch.  Without sensory level. Romberg sign absent.  Reflexes: (R/L) Bj, Trj, Brj, Knj, Aj. No clonus, hofmann, cross adductor. Babinski negative.  ???  ??? Right Left   Triceps 1 1   Biceps 1 1   Brachioradialis 1 1   Patella 0 0   Ankle 1 1   Plantar flexor flexor   ???  Coordination/ fine movement: normal finger to nose with eyes closed.  Gait: normal       Assessment and Plan:      Problem   Vertigo    Spells for years of intermittent dizziness, rarely with vertigo but now described more in terms of altered cognition., initially positionally provoked, now lasting for only seconds.  Occurs daily currently.  Initially with feeling of L ear fullness and pain.  Has been associated with headaches as well  Does have a h/o falling down stairs hitting her head and neck 20 years ago  MRI wo/w + IAC 10/2012 without significant abnormalities  Neuro-otology workup with likely central cause of dizziness (normal audiogram and mostly normal VEMPs, normal electronystagmography, no canal dehiscence on imaging) Has undergone PT for vestibular and cervical causes    Current treatment: Clonazepam 1mg  qAM, qPM and 0.5mg  qHS- this has helped, Depakote (for mood), Verapamil (has had some response), Acupuncture (has had some response)    Prior trials: Prozac (SE), Effexor (suicidal thoughts), Topamax (speech difficulties), nortriptyline, Indomethacin (helped headache but GI SE), Magnesium/Riboflavin/coQ10, Propranolol (changed to Bystolic).  Occipital block with poor tolerance due to lasting pain at injection site          Vertigo  Larena is now describing spells that are closer to altered cognition than vertigo.  This may be related to sleep issues, with a concern for narcolepsy based on history.  She does have poorly treated OSA, as well as multiple other comorbidities that will make an MSLT difficult.  We will defer to Dr Andria Meuse on this.  We also asked her to discuss with her therapists to see if they can provide CBT-I.  Another possibility are focal seizures, though I think this is less  likely.  Will order an EEG to assess, but she is already on Depakote for mood and clonazepam.  We will also give her another referral for vestibular therapy, though I am not sure how much this will help.    RTC in 4 months  Pt was seen by and d/w Dr Louanne Skye

## 2017-07-31 NOTE — Assessment & Plan Note
Christina Garrison is now describing spells that are closer to altered cognition than vertigo.  This may be related to sleep issues, with a concern for narcolepsy based on history.  She does have poorly treated OSA, as well as multiple other comorbidities that will make an MSLT difficult.  We will defer to Dr Gerilyn Nestle on this.  We also asked her to discuss with her therapists to see if they can provide CBT-I.  Another possibility are focal seizures, though I think this is less likely.  Will order an EEG to assess, but she is already on Depakote for mood and clonazepam.  We will also give her another referral for vestibular therapy, though I am not sure how much this will help.

## 2017-07-31 NOTE — Progress Notes
Pt seen, examined and discussed with Dr. Criselda Peaches. I agree with his hx, findings, impressions and plan.

## 2017-08-07 LAB — LIPID PROFILE
Lab: 108
Lab: 108 — ABNORMAL HIGH (ref <100)
Lab: 168
Lab: 22
Lab: 3

## 2017-08-07 LAB — ALT (SGPT): Lab: 26

## 2017-08-07 LAB — BASIC METABOLIC PANEL
Lab: 0.7
Lab: 104
Lab: 12
Lab: 12
Lab: 141
Lab: 29
Lab: 3.8
Lab: 9.3
Lab: 99

## 2017-08-07 LAB — COMPREHENSIVE METABOLIC PANEL: Lab: 12

## 2017-08-11 ENCOUNTER — Ambulatory Visit: Admit: 2017-08-11 | Discharge: 2017-08-11 | Payer: Commercial Managed Care - HMO

## 2017-08-11 ENCOUNTER — Encounter: Admit: 2017-08-11 | Discharge: 2017-08-11 | Payer: Commercial Managed Care - HMO

## 2017-08-11 DIAGNOSIS — Z720 Tobacco use: ICD-10-CM

## 2017-08-11 DIAGNOSIS — R51 Headache: ICD-10-CM

## 2017-08-11 DIAGNOSIS — R42 Dizziness and giddiness: Principal | ICD-10-CM

## 2017-08-11 DIAGNOSIS — F5104 Psychophysiologic insomnia: ICD-10-CM

## 2017-08-11 DIAGNOSIS — G4733 Obstructive sleep apnea (adult) (pediatric): Principal | ICD-10-CM

## 2017-08-11 DIAGNOSIS — R0600 Dyspnea, unspecified: ICD-10-CM

## 2017-08-11 DIAGNOSIS — I10 Essential (primary) hypertension: Principal | ICD-10-CM

## 2017-08-11 DIAGNOSIS — E7800 Pure hypercholesterolemia, unspecified: ICD-10-CM

## 2017-08-11 DIAGNOSIS — K859 Acute pancreatitis without necrosis or infection, unspecified: ICD-10-CM

## 2017-08-11 DIAGNOSIS — N3010 Interstitial cystitis (chronic) without hematuria: ICD-10-CM

## 2017-08-11 DIAGNOSIS — J302 Other seasonal allergic rhinitis: ICD-10-CM

## 2017-08-11 MED ORDER — ESZOPICLONE 3 MG PO TAB
ORAL_TABLET | 2 refills | Status: AC
Start: 2017-08-11 — End: 2017-11-27

## 2017-08-11 NOTE — Progress Notes
Date of Service: 08/11/2017    Subjective:             Christina Garrison is a 46 y.o. female.    History of Present Illness    On oxygen.    New dog, now has 5 dogs.  They get her up at 4 am.  She wakes up when husband gets up at 7 am.  Ablation on Xmas eve.    Trigger injections from Dr. Lesle Chris at [facility], got some relief.  8 pm in bedroom with dogs and watch Netflix.    Clonazepam AM and noon.  NIghtmares at nighttime.  Sleeps on belly or side worsening back painl  Accupuncture on Saturdays helped nightmares.    Forehead headaches.   Ambien in the psat and had sleep walking on this.  ?dose.  It was many years ago.    +headaches.  EEG this afternoon.         Review of Systems   HENT: Positive for postnasal drip and sinus pressure.    Eyes: Positive for photophobia.   Musculoskeletal: Positive for neck pain.   Neurological: Positive for light-headedness and headaches.   Psychiatric/Behavioral: Positive for sleep disturbance. The patient is nervous/anxious.    All other systems reviewed and are negative.        Objective:         ??? Biotin 10,000 mcg cap Take  by mouth twice daily.   ??? brexpiprazole 1 mg tab Take 1 Tab by mouth daily.   ??? buPROPion (WELLBUTRIN) 75 mg tablet Take 75 mg by mouth twice daily.   ??? BYSTOLIC 5 mg tablet TAKE 1 TAB BY MOUTH DAILY.   ??? clonazePAM (KLONOPIN) 0.5 mg tablet Take 1.5 tablets by mouth twice daily.   ??? clonazePAM (KLONOPIN) 1 mg tablet Take one tablet by mouth as Needed (For driving).   ??? divalproex (DEPAKOTE SPRINKLE) 125 mg capsule Take 250 mg by mouth twice daily.   ??? eszopiclone (LUNESTA) 3 mg tablet 1/2-1 po q HS   ??? ezetimibe (ZETIA) 10 mg tablet TAKE 1 TAB BY MOUTH DAILY.   ??? mirtazapine (REMERON) 15 mg tablet Take 15 mg by mouth at bedtime daily.   ??? MULTIVITAMINS WITH IRON (MULTIVITAMIN WITH IRON PO) Take 1 Tab by mouth twice daily.   ??? mupirocin (BACTROBAN) 2 % topical ointment Apply topically to affected area on left upper back twice daily. ??? omeprazole DR(+) (PRILOSEC) 20 mg capsule Take 20 mg by mouth as Needed.   ??? prochlorperazine maleate (COMPAZINE) 10 mg tablet Take 5-10mg  compazine + benadryl 25mg  + tylenol every 6 hours for acute headache or dizziness   ??? rosuvastatin (CRESTOR) 40 mg tablet Take 40 mg by mouth daily.   ??? tiZANidine (ZANAFLEX) 2 mg tablet TAKE 2 TABLETS BY MOUTH AT BEDTIME AS NEEDED     Vitals:    08/11/17 1111   BP: 119/80   Pulse: 68   Weight: 108.9 kg (240 lb)   Height: 162.6 cm (64)     Body mass index is 41.2 kg/m???.     Physical Exam  Nose; patent  OP: Malampati 2  Neck: Neck supple.   Cardiovascular: Normal rate and regular rhythm.    Pulmonary/Chest: Effort normal and breath sounds normal.   Musculoskeletal: No edema.   Neurological: alert.   Skin: Skin is warm and dry.   Psychiatric: Normal mood and affect.         Assessment and Plan:  *The patient's Epworth Sleepiness  Scale Score is 2/24.  STOPBANG Score: 3  If score > 3 there is a high probability that they have OSA.  Depression Screening was performed on Christina Garrison in clinic today. Based on the score of 4, I provided support in the office today via active listening and general counseling.**  Discussed patient's BMI with her.  The body mass index is 41.2 kg/m???. and falls within the category of Obesity 3 (>40); counseled regarding weight loss.    Problem   Obstructive Sleep Apnea Syndrome    PSG on 03/16/15: AHI 8.9 events per hour,The REM AHI was 21.3, supine REM AHI 37.??? The overall supine AHI was 16 and lateral AHI was 0. Oxyhemoglobin saturation nadir of 84% and Time below 88% was 12.8 minutes.   PLMS 0/hr   12/19/2015 oximetry on 2 Liters, <88% 6 minutes.  Increase to 3 liters and recheck.  Much better headaches.     Chronic Insomnia    - trouble with sleep maintenance           Obstructive sleep apnea syndrome  On oxygen with benefit.      Chronic insomnia  She is having multiple sleep issues.  A lot of them are related to marital stress, pain and also now she has 5 dogs.  The dogs are a source of stress between her and her husband.  She is tearful today and says if she could just get some sleep she'd feel better.  She denies suicidal thoughts.  Prescribed Lunesta.  Discussed risks/benefits/SE/alternatives.

## 2017-08-11 NOTE — Telephone Encounter
Pt's medication Lunesta called into [facility] in [place].

## 2017-08-12 ENCOUNTER — Encounter: Admit: 2017-08-12 | Discharge: 2017-08-12 | Payer: Commercial Managed Care - HMO

## 2017-08-12 DIAGNOSIS — R42 Dizziness and giddiness: Principal | ICD-10-CM

## 2017-08-12 NOTE — Progress Notes
Interpretation completed for EEG performed on 08/11/2017, see EEG encounter for report.

## 2017-08-14 ENCOUNTER — Encounter: Admit: 2017-08-14 | Discharge: 2017-08-14 | Payer: Commercial Managed Care - HMO

## 2017-08-17 ENCOUNTER — Encounter: Admit: 2017-08-17 | Discharge: 2017-08-17 | Payer: Commercial Managed Care - HMO

## 2017-08-17 NOTE — Telephone Encounter
Return call to patient with EEG results" Normal" , Patient expressed understanding by repeating information and thanked me .

## 2017-08-20 ENCOUNTER — Encounter: Admit: 2017-08-20 | Discharge: 2017-08-20 | Payer: Commercial Managed Care - HMO

## 2017-08-20 NOTE — Telephone Encounter
Received a message from patient requesting to schedule an appt w/ Dr Jackquline Berlin. I rtc and LVM asking pt to call back, [phone] and let us know what type of appt is needed. Explained we will need to make a NP appt since it's been over 3 years since she has been seen in office.

## 2017-08-24 ENCOUNTER — Ambulatory Visit: Admit: 2017-08-24 | Discharge: 2017-08-25 | Payer: Commercial Managed Care - HMO

## 2017-08-24 ENCOUNTER — Encounter: Admit: 2017-08-24 | Discharge: 2017-08-24 | Payer: Commercial Managed Care - HMO

## 2017-08-24 DIAGNOSIS — J302 Other seasonal allergic rhinitis: ICD-10-CM

## 2017-08-24 DIAGNOSIS — E7800 Pure hypercholesterolemia, unspecified: ICD-10-CM

## 2017-08-24 DIAGNOSIS — R0600 Dyspnea, unspecified: ICD-10-CM

## 2017-08-24 DIAGNOSIS — M5416 Radiculopathy, lumbar region: Secondary | ICD-10-CM

## 2017-08-24 DIAGNOSIS — N3010 Interstitial cystitis (chronic) without hematuria: ICD-10-CM

## 2017-08-24 DIAGNOSIS — R51 Headache: ICD-10-CM

## 2017-08-24 DIAGNOSIS — Z720 Tobacco use: ICD-10-CM

## 2017-08-24 DIAGNOSIS — I10 Essential (primary) hypertension: Secondary | ICD-10-CM

## 2017-08-24 DIAGNOSIS — K859 Acute pancreatitis without necrosis or infection, unspecified: ICD-10-CM

## 2017-08-24 MED ORDER — LIDOCAINE (PF) 20 MG/ML (2 %) IJ SOLN
5 mL | Freq: Once | INTRAMUSCULAR | 0 refills | Status: CP
Start: 2017-08-24 — End: ?
  Administered 2017-08-24: 18:00:00 100 mg via INTRAMUSCULAR

## 2017-08-24 MED ORDER — MIDAZOLAM 1 MG/ML IJ SOLN
1-2 mg | INTRAVENOUS | 0 refills | Status: DC | PRN
Start: 2017-08-24 — End: 2017-08-25
  Administered 2017-08-24: 19:00:00 2 mg via INTRAVENOUS

## 2017-08-24 MED ORDER — FENTANYL CITRATE (PF) 50 MCG/ML IJ SOLN
50-100 ug | INTRAVENOUS | 0 refills | Status: DC | PRN
Start: 2017-08-24 — End: 2017-08-25
  Administered 2017-08-24 (×2): 50 ug via INTRAVENOUS

## 2017-08-24 NOTE — Progress Notes
RN removed 24g IV in right AC.  Tip intact dressing clean.  Patient tolerated well.

## 2017-08-24 NOTE — Procedures
LUMBAR MEDIAL BRANCH RADIOFREQUENCY ABLATION UNDER FLUOROSCOPY    PROCEDURE:  1) bilateral L3-L5 medial branch radiofrequency ablation  2) Fluoroscopic needle guidance    REASON FOR PROCEDURE: Spondylosis without myleopathy or radiculopathy, facet arthropathy, DDD (lumbosacral)    PHYSICIAN: Takeo Harts, MD    MEDICATIONS INJECTED: Before the ablation, 1 mL of 2% lidocaine was injected at each level.     LOCAL ANESTHETIC INJECTED: 1 mL of 1% lidocaine per site    SEDATION MEDICATIONS: Versed and Fentanyl as per nurse charting.     ESTIMATED BLOOD LOSS: None    SPECIMENS REMOVED: None    COMPLICATIONS: None    TECHNIQUE: Time-out was taken to identify the correct patient, procedure and side prior to starting the procedure. Lying in a prone position, the patient was prepped and draped in the usual sterile fashion using DuraPrep and a fenestrated drape. The levels were determined under fluoroscopy. Local anesthetic was given by raising a skin wheal and going down to the hub of a 27-gauge 1.25-inch needle. A 10mm tip radiofrequency needle was introduced to the anatomic location of the medial branch at the junction of the superior articular process and transverse process utilizing intermittent fluoroscopy. After sensory stimulation, motor stimulation up to 2 volts was done to confirm no ablation of the ventral ramus at each level. 1 mL of 2% lidocaine was then injected slowly at each level. After waiting 30-60 seconds, ablation was performed utilizing a radiofrequency generator at 80 degrees C for 90 seconds.     The procedure was completed without complications and was tolerated well. The patient was monitored after the procedure. The patient (or responsible party) was given post-procedure and discharge instructions to follow at home. The patient was discharged in stable condition.     Janyah Singleterry, MD

## 2017-08-24 NOTE — Progress Notes
RADIO  FREQUENCY  ABLATION  PROCEDURE    Ground Location: Right Side  ______________________________________  Lead: 1  Location: Right L3  SENSORY STIMULATION  Positive reaction @ na volts  MOTOR STIMULATION TEST  Negative response @ 2.0 volts  ABLATION PROCEDURE  Time: 90 sec  Temperature 80 C  Impedance: 25 Ohms Ohms  ___________________________________________  Lead: 2  Location:Right L4  SENSORY STIMULATION  Positive reaction @ na volts  MOTOR STIMULATION TEST  Negative response @ 2.0 volts  ABLATION PROCEDURE  Time: 90 sec  Temperature 80 C  Impedance: 205 Ohms  Ohms  ___________________________________________  Lead: 3  Location:Right L5  SENSORY STIMULATION  Positive reaction @ na volts  MOTOR STIMULATION TEST  Negative response @ 2.0 volts  ABLATION PROCEDURE  Time: 90 sec  Temperature 80 C  Impedance: 213 Ohms  Ohms  ___________________________________________  Lead: 4  Location: Left L3  SENSORY STIMULATION  Positive reaction @ na volts  MOTOR STIMULATION TEST  Negative response @ 2.0 volts  ABLATION PROCEDURE  Time: 90 sec  Temperature 80 C  Impedance: 208 Ohms    ______________________________________  Lead: 1  Location: Left l4  SENSORY STIMULATION  Positive reaction @ na volts  MOTOR STIMULATION TEST  Negative response @ 2.0 volts  ABLATION PROCEDURE  Time: 90 sec  Temperature 80 C  Impedance: 208 Ohms   ___________________________________________  Lead: 2  Location:Left L5  SENSORY STIMULATION  Positive reaction @ na volts  MOTOR STIMULATION TEST  Negative response @ 2.0 volts  ABLATION PROCEDURE  Time: 90 sec  Temperature 80 C  Impedance: 285 Ohms  ___________________________________________

## 2017-08-24 NOTE — Progress Notes
[facility]  INTERVENTIONAL PAIN PROCEDURE HISTORY AND PHYSICAL    No chief complaint on file.      HISTORY OF PRESENT ILLNESS:  LBP, axial. No radicular pain. Bilateral L4-S1 MBB x2 with at least 80% improvement each time.     Patient denies fevers, chills, infection, bleeding issues, anticoagulants, new muscle weakness, numbness, tingling, bowel/bladder incontinence, or saddle anesthesia.      Past Medical History:   Diagnosis Date   ??? Dyspnea    ??? Generalized headaches    ??? High cholesterol    ??? HTN (hypertension)    ??? Hypertension 11/21/2010   ??? Interstitial cystitis    ??? Pancreatitis 11/21/2010   ??? Seasonal allergic reaction    ??? Tobacco abuse        Past Surgical History:   Procedure Laterality Date   ??? SINUS SURGERY  2004   ??? PR LAPS GSTRC RSTRICTIV PX LONGITUDINAL GASTRECTOMY  03/2014   ??? ELECTROCARDIOGRAM     ??? HX APPENDECTOMY     ??? HX CHOLECYSTECTOMY     ??? HX PARTIAL HYSTERECTOMY         family history includes Allergy-severe in her mother; Asthma in her mother; Cancer (age of onset: 51) in her sister; Coronary Artery Disease in her father and mother; Diabetes in her mother and sister; Heart Attack in her father and mother; Heart Disease (age of onset: 64) in her sister; High Cholesterol in her father, mother, and sister; Hypertension in her brother, father, mother, and sister; Tumor in her sister; Tumor (age of onset: 76) in her sister.    Social History     Socioeconomic History   ??? Marital status: Married     Spouse name: Not on file   ??? Number of children: Not on file   ??? Years of education: Not on file   ??? Highest education level: Not on file   Social Needs   ??? Financial resource strain: Not on file   ??? Food insecurity - worry: Not on file   ??? Food insecurity - inability: Not on file   ??? Transportation needs - medical: Not on file   ??? Transportation needs - non-medical: Not on file   Occupational History   ??? Not on file   Tobacco Use   ??? Smoking status: Former Smoker     Packs/day: 0.50 Years: 25.00     Pack years: 12.50     Types: Cigarettes     Last attempt to quit: 05/02/2012     Years since quitting: 5.3   ??? Smokeless tobacco: Never Used   ??? Tobacco comment: Cessation Counseling given 11/29/10   Substance and Sexual Activity   ??? Alcohol use: No     Alcohol/week: 0.0 oz   ??? Drug use: No   ??? Sexual activity: Not Currently     Birth control/protection: Surgical   Other Topics Concern   ??? Not on file   Social History Narrative   ??? Not on file       Allergies   Allergen Reactions   ??? Strawberry HIVES   ??? Diovan [Valsartan] HIVES   ??? Effexor [Venlafaxine] SEE COMMENTS     Suicidal thoughts   ??? Prozac [Fluoxetine] NAUSEA AND VOMITING and ANXIETY   ??? Topamax [Topiramate] SEE COMMENTS     Feels like she is going to pass out and speech messed up   ??? Decadron [Dexamethasone] NAUSEA AND VOMITING     Also causes agitation  There were no vitals filed for this visit.    REVIEW OF SYSTEMS: 10 point ROS obtained and negative       PHYSICAL EXAM:    General: The patient is a well-developed, overweight 46 y.o. female in no acute distress.   HEENT: Head is normocephalic and atraumatic. Pupils are equal and reactive to light bilaterally.   Cardiac: Based on palpation, pulse appears to be regular rate and rhythm.   Pulmonary: The patient has unlabored respirations and bilateral symmetric chest excursion.   Abdomen: Soft, nontender, and nondistended. There is no rebound or guarding.   Extremities: No clubbing, cyanosis, or edema.     Neurologic:   The patient is alert and oriented times 3.     Musculoskeletal:     L-Spine   There is mod low lumbar paraspinal tenderness. Paraspinal muscle tone is increased.  Facet loading is positive.  ROM with flexion, extension, rotation, and lateral bending is intact.  SLR is negative bilaterally.        IMPRESSION:    1. Spondylosis of lumbosacral region without myelopathy or radiculopathy    2. DDD (degenerative disc disease), lumbosacral    3. Facet arthropathy PLAN: Other Bilateral L3-5 RFA      General Pre Procedural Sedation ASA Classification    I have discussed risks and alternatives of this type of sedation and procedure with the patient (and other designee if appropriate).    Patient has been NPO for 6 hours as per protocol.     Pregnancy Status: No    Prior Anesthetic Types: Anxiolysis    Airway: Airway assessment performed II (soft palate, uvula, fauces visible)    Head and Neck: No abnormalities noted    Mouth: No abnormalities noted    Medications for Procedural Sedation: Versed and/or Fentanyl    Anesthesia Classification:  ASA III (A patient with a severe systemic disease that limits activity, but is not incapacitating)    Patient remains a candidate for procedure: Yes    The intention for the procedure today is Anxiolysis/Analgesia.

## 2017-08-24 NOTE — Progress Notes
Sedation physician present in room.  Recent vitals and patient condition reviewed between sedating physician and nurse.  Reassessment completed.  Determination made to proceed with planned sedation.

## 2017-08-25 DIAGNOSIS — M5137 Other intervertebral disc degeneration, lumbosacral region: ICD-10-CM

## 2017-08-25 DIAGNOSIS — M47819 Spondylosis without myelopathy or radiculopathy, site unspecified: ICD-10-CM

## 2017-08-25 DIAGNOSIS — Z87891 Personal history of nicotine dependence: ICD-10-CM

## 2017-08-25 DIAGNOSIS — M47817 Spondylosis without myelopathy or radiculopathy, lumbosacral region: Principal | ICD-10-CM

## 2017-08-25 DIAGNOSIS — I10 Essential (primary) hypertension: ICD-10-CM

## 2017-08-25 DIAGNOSIS — E7800 Pure hypercholesterolemia, unspecified: ICD-10-CM

## 2017-08-25 DIAGNOSIS — M5416 Radiculopathy, lumbar region: ICD-10-CM

## 2017-09-02 ENCOUNTER — Encounter: Admit: 2017-09-02 | Discharge: 2017-09-02 | Payer: Commercial Managed Care - HMO

## 2017-09-02 DIAGNOSIS — E7800 Pure hypercholesterolemia, unspecified: ICD-10-CM

## 2017-09-02 DIAGNOSIS — Z720 Tobacco use: ICD-10-CM

## 2017-09-02 DIAGNOSIS — I10 Essential (primary) hypertension: Principal | ICD-10-CM

## 2017-09-02 DIAGNOSIS — R0600 Dyspnea, unspecified: ICD-10-CM

## 2017-09-02 DIAGNOSIS — R51 Headache: ICD-10-CM

## 2017-09-02 DIAGNOSIS — N3010 Interstitial cystitis (chronic) without hematuria: ICD-10-CM

## 2017-09-02 DIAGNOSIS — K859 Acute pancreatitis without necrosis or infection, unspecified: ICD-10-CM

## 2017-09-02 DIAGNOSIS — J302 Other seasonal allergic rhinitis: ICD-10-CM

## 2017-09-02 NOTE — Assessment & Plan Note
On oxygen with benefit.

## 2017-09-10 ENCOUNTER — Encounter: Admit: 2017-09-10 | Discharge: 2017-09-10 | Payer: Commercial Managed Care - HMO

## 2017-09-19 ENCOUNTER — Encounter: Admit: 2017-09-19 | Discharge: 2017-09-19 | Payer: Commercial Managed Care - HMO

## 2017-09-19 DIAGNOSIS — E785 Hyperlipidemia, unspecified: Principal | ICD-10-CM

## 2017-09-22 MED ORDER — ROSUVASTATIN 20 MG PO TAB
ORAL_TABLET | Freq: Every day | ORAL | 0 refills | 90.00000 days | Status: AC
Start: 2017-09-22 — End: 2017-11-27

## 2017-10-19 ENCOUNTER — Encounter: Admit: 2017-10-19 | Discharge: 2017-10-19 | Payer: Commercial Managed Care - HMO

## 2017-11-05 ENCOUNTER — Encounter: Admit: 2017-11-05 | Discharge: 2017-11-05 | Payer: Commercial Managed Care - HMO

## 2017-11-05 DIAGNOSIS — E785 Hyperlipidemia, unspecified: Principal | ICD-10-CM

## 2017-11-27 ENCOUNTER — Ambulatory Visit: Admit: 2017-11-27 | Discharge: 2017-11-28 | Payer: Commercial Managed Care - HMO

## 2017-11-27 ENCOUNTER — Encounter: Admit: 2017-11-27 | Discharge: 2017-11-27 | Payer: Commercial Managed Care - HMO

## 2017-11-27 DIAGNOSIS — K859 Acute pancreatitis without necrosis or infection, unspecified: ICD-10-CM

## 2017-11-27 DIAGNOSIS — Z720 Tobacco use: ICD-10-CM

## 2017-11-27 DIAGNOSIS — R42 Dizziness and giddiness: Secondary | ICD-10-CM

## 2017-11-27 DIAGNOSIS — E7800 Pure hypercholesterolemia, unspecified: ICD-10-CM

## 2017-11-27 DIAGNOSIS — R51 Headache: ICD-10-CM

## 2017-11-27 DIAGNOSIS — N3010 Interstitial cystitis (chronic) without hematuria: ICD-10-CM

## 2017-11-27 DIAGNOSIS — R0600 Dyspnea, unspecified: ICD-10-CM

## 2017-11-27 DIAGNOSIS — F5104 Psychophysiologic insomnia: Principal | ICD-10-CM

## 2017-11-27 DIAGNOSIS — J302 Other seasonal allergic rhinitis: ICD-10-CM

## 2017-11-27 DIAGNOSIS — I10 Essential (primary) hypertension: ICD-10-CM

## 2017-11-27 MED ORDER — CLONAZEPAM 1 MG PO TAB
1 mg | ORAL_TABLET | ORAL | 5 refills | Status: AC | PRN
Start: 2017-11-27 — End: 2018-04-08

## 2017-11-27 MED ORDER — CLONAZEPAM 0.5 MG PO TAB
.75 mg | ORAL_TABLET | Freq: Two times a day (BID) | ORAL | 5 refills | Status: AC
Start: 2017-11-27 — End: 2018-04-08

## 2017-12-25 ENCOUNTER — Encounter: Admit: 2017-12-25 | Discharge: 2017-12-25 | Payer: Commercial Managed Care - HMO

## 2017-12-25 MED ORDER — NEBIVOLOL 5 MG PO TAB
5 mg | ORAL_TABLET | Freq: Every day | ORAL | 0 refills | 60.00000 days | Status: AC
Start: 2017-12-25 — End: 2018-03-22

## 2017-12-25 MED ORDER — BYSTOLIC 5 MG PO TAB
ORAL_TABLET | Freq: Every day | 1 refills
Start: 2017-12-25 — End: ?

## 2017-12-26 ENCOUNTER — Encounter: Admit: 2017-12-26 | Discharge: 2017-12-26 | Payer: Commercial Managed Care - HMO

## 2017-12-28 MED ORDER — ROSUVASTATIN 20 MG PO TAB
ORAL_TABLET | Freq: Every day | ORAL | 0 refills | 90.00000 days | Status: AC
Start: 2017-12-28 — End: 2017-12-30

## 2017-12-30 ENCOUNTER — Encounter: Admit: 2017-12-30 | Discharge: 2017-12-30 | Payer: Commercial Managed Care - HMO

## 2017-12-30 MED ORDER — ROSUVASTATIN 20 MG PO TAB
ORAL_TABLET | Freq: Every day | ORAL | 0 refills | 90.00000 days | Status: AC
Start: 2017-12-30 — End: 2018-01-05

## 2018-01-05 ENCOUNTER — Ambulatory Visit: Admit: 2018-01-05 | Discharge: 2018-01-06 | Payer: Commercial Managed Care - HMO

## 2018-01-05 ENCOUNTER — Encounter: Admit: 2018-01-05 | Discharge: 2018-01-05 | Payer: Commercial Managed Care - HMO

## 2018-01-05 DIAGNOSIS — E7800 Pure hypercholesterolemia, unspecified: ICD-10-CM

## 2018-01-05 DIAGNOSIS — N3010 Interstitial cystitis (chronic) without hematuria: ICD-10-CM

## 2018-01-05 DIAGNOSIS — R51 Headache: ICD-10-CM

## 2018-01-05 DIAGNOSIS — J302 Other seasonal allergic rhinitis: ICD-10-CM

## 2018-01-05 DIAGNOSIS — I10 Essential (primary) hypertension: Principal | ICD-10-CM

## 2018-01-05 DIAGNOSIS — Z720 Tobacco use: ICD-10-CM

## 2018-01-05 DIAGNOSIS — K859 Acute pancreatitis without necrosis or infection, unspecified: ICD-10-CM

## 2018-01-05 DIAGNOSIS — R0600 Dyspnea, unspecified: ICD-10-CM

## 2018-01-09 ENCOUNTER — Encounter: Admit: 2018-01-09 | Discharge: 2018-01-09 | Payer: Commercial Managed Care - HMO

## 2018-01-11 MED ORDER — VERAPAMIL 40 MG PO TAB
40 mg | ORAL_TABLET | Freq: Two times a day (BID) | ORAL | 3 refills | 90.00000 days | Status: AC
Start: 2018-01-11 — End: 2018-02-09

## 2018-02-09 ENCOUNTER — Encounter: Admit: 2018-02-09 | Discharge: 2018-02-09 | Payer: Commercial Managed Care - HMO

## 2018-02-09 ENCOUNTER — Ambulatory Visit: Admit: 2018-02-09 | Discharge: 2018-02-09 | Payer: Commercial Managed Care - HMO

## 2018-02-09 DIAGNOSIS — I10 Essential (primary) hypertension: Principal | ICD-10-CM

## 2018-02-09 DIAGNOSIS — F5104 Psychophysiologic insomnia: ICD-10-CM

## 2018-02-09 DIAGNOSIS — F339 Major depressive disorder, recurrent, unspecified: ICD-10-CM

## 2018-02-09 DIAGNOSIS — R51 Headache: ICD-10-CM

## 2018-02-09 DIAGNOSIS — E7800 Pure hypercholesterolemia, unspecified: ICD-10-CM

## 2018-02-09 DIAGNOSIS — Z720 Tobacco use: ICD-10-CM

## 2018-02-09 DIAGNOSIS — N3010 Interstitial cystitis (chronic) without hematuria: ICD-10-CM

## 2018-02-09 DIAGNOSIS — F329 Major depressive disorder, single episode, unspecified: ICD-10-CM

## 2018-02-09 DIAGNOSIS — R0600 Dyspnea, unspecified: ICD-10-CM

## 2018-02-09 DIAGNOSIS — G4733 Obstructive sleep apnea (adult) (pediatric): Principal | ICD-10-CM

## 2018-02-09 DIAGNOSIS — J302 Other seasonal allergic rhinitis: ICD-10-CM

## 2018-02-09 DIAGNOSIS — K859 Acute pancreatitis without necrosis or infection, unspecified: ICD-10-CM

## 2018-02-09 MED ORDER — HYDROXYZINE HCL 10 MG PO TAB
10 mg | ORAL_TABLET | Freq: Three times a day (TID) | ORAL | 3 refills | 30.00000 days | Status: AC | PRN
Start: 2018-02-09 — End: 2018-06-04

## 2018-02-12 ENCOUNTER — Encounter: Admit: 2018-02-12 | Discharge: 2018-02-12 | Payer: Commercial Managed Care - HMO

## 2018-02-12 DIAGNOSIS — N3010 Interstitial cystitis (chronic) without hematuria: ICD-10-CM

## 2018-02-12 DIAGNOSIS — R51 Headache: ICD-10-CM

## 2018-02-12 DIAGNOSIS — Z720 Tobacco use: ICD-10-CM

## 2018-02-12 DIAGNOSIS — E7800 Pure hypercholesterolemia, unspecified: ICD-10-CM

## 2018-02-12 DIAGNOSIS — R0600 Dyspnea, unspecified: ICD-10-CM

## 2018-02-12 DIAGNOSIS — I10 Essential (primary) hypertension: Secondary | ICD-10-CM

## 2018-02-12 DIAGNOSIS — K859 Acute pancreatitis without necrosis or infection, unspecified: ICD-10-CM

## 2018-02-12 DIAGNOSIS — J302 Other seasonal allergic rhinitis: ICD-10-CM

## 2018-03-18 DIAGNOSIS — F5101 Primary insomnia: Secondary | ICD-10-CM

## 2018-03-19 ENCOUNTER — Ambulatory Visit: Admit: 2018-03-18 | Discharge: 2018-03-19 | Payer: Commercial Managed Care - HMO

## 2018-03-19 DIAGNOSIS — F329 Major depressive disorder, single episode, unspecified: ICD-10-CM

## 2018-03-19 DIAGNOSIS — F5104 Psychophysiologic insomnia: Principal | ICD-10-CM

## 2018-03-20 ENCOUNTER — Encounter: Admit: 2018-03-20 | Discharge: 2018-03-20 | Payer: Commercial Managed Care - HMO

## 2018-03-22 MED ORDER — BYSTOLIC 5 MG PO TAB
ORAL_TABLET | Freq: Every day | ORAL | 3 refills | 60.00000 days | Status: AC
Start: 2018-03-22 — End: 2018-12-16

## 2018-03-31 ENCOUNTER — Encounter: Admit: 2018-03-31 | Discharge: 2018-03-31 | Payer: Commercial Managed Care - HMO

## 2018-03-31 DIAGNOSIS — G4733 Obstructive sleep apnea (adult) (pediatric): Principal | ICD-10-CM

## 2018-04-08 ENCOUNTER — Ambulatory Visit: Admit: 2018-04-08 | Discharge: 2018-04-09 | Payer: Commercial Managed Care - HMO

## 2018-04-08 ENCOUNTER — Encounter: Admit: 2018-04-08 | Discharge: 2018-04-08 | Payer: Commercial Managed Care - HMO

## 2018-04-08 DIAGNOSIS — I10 Essential (primary) hypertension: Principal | ICD-10-CM

## 2018-04-08 DIAGNOSIS — R51 Headache: ICD-10-CM

## 2018-04-08 DIAGNOSIS — K859 Acute pancreatitis without necrosis or infection, unspecified: ICD-10-CM

## 2018-04-08 DIAGNOSIS — H8193 Unspecified disorder of vestibular function, bilateral: ICD-10-CM

## 2018-04-08 DIAGNOSIS — J302 Other seasonal allergic rhinitis: ICD-10-CM

## 2018-04-08 DIAGNOSIS — E7800 Pure hypercholesterolemia, unspecified: ICD-10-CM

## 2018-04-08 DIAGNOSIS — N3010 Interstitial cystitis (chronic) without hematuria: ICD-10-CM

## 2018-04-08 DIAGNOSIS — Z720 Tobacco use: ICD-10-CM

## 2018-04-08 DIAGNOSIS — R42 Dizziness and giddiness: Principal | ICD-10-CM

## 2018-04-08 DIAGNOSIS — R0600 Dyspnea, unspecified: ICD-10-CM

## 2018-04-08 DIAGNOSIS — G44209 Tension-type headache, unspecified, not intractable: ICD-10-CM

## 2018-04-08 MED ORDER — CLONAZEPAM 0.5 MG PO TAB
.75 mg | ORAL_TABLET | Freq: Two times a day (BID) | ORAL | 5 refills | Status: AC
Start: 2018-04-08 — End: 2018-10-08

## 2018-04-08 MED ORDER — SCOPOLAMINE BASE 1 MG OVER 3 DAYS TD PT3D
1 | MEDICATED_PATCH | TRANSDERMAL | 3 refills | Status: AC
Start: 2018-04-08 — End: 2018-07-19

## 2018-04-08 MED ORDER — CLONAZEPAM 1 MG PO TAB
1 mg | ORAL_TABLET | ORAL | 5 refills | Status: AC | PRN
Start: 2018-04-08 — End: 2018-10-08

## 2018-05-31 ENCOUNTER — Encounter: Admit: 2018-05-31 | Discharge: 2018-05-31 | Payer: Commercial Managed Care - HMO

## 2018-06-04 ENCOUNTER — Encounter: Admit: 2018-06-04 | Discharge: 2018-06-04 | Payer: Commercial Managed Care - HMO

## 2018-06-04 MED ORDER — HYDROXYZINE HCL 10 MG PO TAB
ORAL_TABLET | Freq: Three times a day (TID) | ORAL | 3 refills | 30.00000 days | Status: AC | PRN
Start: 2018-06-04 — End: 2018-08-27

## 2018-07-17 ENCOUNTER — Encounter: Admit: 2018-07-17 | Discharge: 2018-07-17 | Payer: Commercial Managed Care - HMO

## 2018-07-19 ENCOUNTER — Encounter: Admit: 2018-07-19 | Discharge: 2018-07-19 | Payer: Commercial Managed Care - HMO

## 2018-07-19 MED ORDER — SCOPOLAMINE BASE 1 MG OVER 3 DAYS TD PT3D
1 | MEDICATED_PATCH | TRANSDERMAL | 3 refills | Status: AC
Start: 2018-07-19 — End: 2018-10-07

## 2018-08-02 ENCOUNTER — Encounter: Admit: 2018-08-02 | Discharge: 2018-08-02 | Payer: Commercial Managed Care - HMO

## 2018-08-02 DIAGNOSIS — Z1231 Encounter for screening mammogram for malignant neoplasm of breast: Principal | ICD-10-CM

## 2018-08-04 ENCOUNTER — Ambulatory Visit: Admit: 2018-08-04 | Discharge: 2018-08-04 | Payer: Commercial Managed Care - HMO

## 2018-08-04 ENCOUNTER — Encounter: Admit: 2018-08-04 | Discharge: 2018-08-04 | Payer: Commercial Managed Care - HMO

## 2018-08-04 ENCOUNTER — Ambulatory Visit: Admit: 2018-08-04 | Discharge: 2018-08-05 | Payer: Commercial Managed Care - HMO

## 2018-08-04 DIAGNOSIS — N3010 Interstitial cystitis (chronic) without hematuria: ICD-10-CM

## 2018-08-04 DIAGNOSIS — Z1231 Encounter for screening mammogram for malignant neoplasm of breast: Principal | ICD-10-CM

## 2018-08-04 DIAGNOSIS — Z720 Tobacco use: ICD-10-CM

## 2018-08-04 DIAGNOSIS — R079 Chest pain, unspecified: ICD-10-CM

## 2018-08-04 DIAGNOSIS — R1013 Epigastric pain: ICD-10-CM

## 2018-08-04 DIAGNOSIS — J302 Other seasonal allergic rhinitis: ICD-10-CM

## 2018-08-04 DIAGNOSIS — R110 Nausea: ICD-10-CM

## 2018-08-04 DIAGNOSIS — Z9889 Other specified postprocedural states: ICD-10-CM

## 2018-08-04 DIAGNOSIS — E7800 Pure hypercholesterolemia, unspecified: ICD-10-CM

## 2018-08-04 DIAGNOSIS — M1990 Unspecified osteoarthritis, unspecified site: ICD-10-CM

## 2018-08-04 DIAGNOSIS — R51 Headache: ICD-10-CM

## 2018-08-04 DIAGNOSIS — R0600 Dyspnea, unspecified: ICD-10-CM

## 2018-08-04 DIAGNOSIS — K859 Acute pancreatitis without necrosis or infection, unspecified: ICD-10-CM

## 2018-08-04 DIAGNOSIS — I10 Essential (primary) hypertension: ICD-10-CM

## 2018-08-04 MED ORDER — PANTOPRAZOLE 40 MG PO TBEC
40 mg | ORAL_TABLET | Freq: Two times a day (BID) | ORAL | 5 refills | 90.00000 days | Status: AC
Start: 2018-08-04 — End: 2018-10-08

## 2018-08-04 MED ORDER — PEG-ELECTROLYTE SOLN 420 GRAM PO SOLR
0 refills | Status: AC
Start: 2018-08-04 — End: 2018-09-17

## 2018-08-04 MED ORDER — SODIUM CHLORIDE 0.9 % IV SOLP
INTRAVENOUS | 0 refills | Status: CN
Start: 2018-08-04 — End: ?

## 2018-08-05 DIAGNOSIS — Z800 Family history of malignant neoplasm of digestive organs: Secondary | ICD-10-CM

## 2018-08-05 DIAGNOSIS — R110 Nausea: Secondary | ICD-10-CM

## 2018-08-05 LAB — COMPREHENSIVE METABOLIC PANEL
Lab: 11 (ref 3–12)
Lab: 141 MMOL/L (ref 137–147)
Lab: 26 MMOL/L (ref 21–30)
Lab: 34 U/L (ref 7–56)
Lab: 4.5 MMOL/L (ref 3.5–5.1)
Lab: >60 mL/min (ref >60)
Lab: >60 mL/min (ref >60)

## 2018-08-05 LAB — CBC
Lab: 10 K/UL (ref 4.5–11.0)
Lab: 101 FL — ABNORMAL HIGH (ref 80–100)
Lab: 13 % (ref 11–15)
Lab: 14 g/dL (ref 12.0–15.0)
Lab: 33 g/dL (ref 32.0–36.0)
Lab: 336 K/UL (ref 150–400)
Lab: 34 pg — ABNORMAL HIGH (ref 26–34)
Lab: 4.3 M/UL (ref 4.0–5.0)
Lab: 43 % (ref 36–45)
Lab: 8.6 FL (ref 7–11)

## 2018-08-05 LAB — TSH WITH FREE T4 REFLEX: Lab: 1.7 uU/mL (ref 0.35–5.00)

## 2018-08-06 ENCOUNTER — Encounter: Admit: 2018-08-06 | Discharge: 2018-08-06 | Payer: Commercial Managed Care - HMO

## 2018-08-06 DIAGNOSIS — R110 Nausea: ICD-10-CM

## 2018-08-06 DIAGNOSIS — R1013 Epigastric pain: ICD-10-CM

## 2018-08-06 DIAGNOSIS — R079 Chest pain, unspecified: ICD-10-CM

## 2018-08-06 DIAGNOSIS — Z9889 Other specified postprocedural states: Principal | ICD-10-CM

## 2018-08-09 ENCOUNTER — Encounter: Admit: 2018-08-09 | Discharge: 2018-08-09 | Payer: Commercial Managed Care - HMO

## 2018-08-12 ENCOUNTER — Encounter: Admit: 2018-08-12 | Discharge: 2018-08-12 | Payer: Commercial Managed Care - HMO

## 2018-08-18 ENCOUNTER — Encounter: Admit: 2018-08-18 | Discharge: 2018-08-18 | Payer: Commercial Managed Care - HMO

## 2018-08-18 DIAGNOSIS — E7800 Pure hypercholesterolemia, unspecified: ICD-10-CM

## 2018-08-18 DIAGNOSIS — M1990 Unspecified osteoarthritis, unspecified site: ICD-10-CM

## 2018-08-18 DIAGNOSIS — F329 Major depressive disorder, single episode, unspecified: ICD-10-CM

## 2018-08-18 DIAGNOSIS — N3010 Interstitial cystitis (chronic) without hematuria: ICD-10-CM

## 2018-08-18 DIAGNOSIS — I10 Essential (primary) hypertension: Principal | ICD-10-CM

## 2018-08-18 DIAGNOSIS — R0600 Dyspnea, unspecified: ICD-10-CM

## 2018-08-18 DIAGNOSIS — Z720 Tobacco use: ICD-10-CM

## 2018-08-18 DIAGNOSIS — J302 Other seasonal allergic rhinitis: ICD-10-CM

## 2018-08-18 DIAGNOSIS — K859 Acute pancreatitis without necrosis or infection, unspecified: ICD-10-CM

## 2018-08-18 DIAGNOSIS — K219 Gastro-esophageal reflux disease without esophagitis: ICD-10-CM

## 2018-08-18 DIAGNOSIS — G4733 Obstructive sleep apnea (adult) (pediatric): ICD-10-CM

## 2018-08-18 DIAGNOSIS — R51 Headache: ICD-10-CM

## 2018-08-18 DIAGNOSIS — M549 Dorsalgia, unspecified: ICD-10-CM

## 2018-08-23 ENCOUNTER — Encounter: Admit: 2018-08-23 | Discharge: 2018-08-24 | Payer: Commercial Managed Care - HMO

## 2018-08-23 ENCOUNTER — Encounter: Admit: 2018-08-23 | Discharge: 2018-08-23 | Payer: Commercial Managed Care - HMO

## 2018-08-23 ENCOUNTER — Ambulatory Visit: Admit: 2018-08-23 | Discharge: 2018-08-23 | Payer: Commercial Managed Care - HMO

## 2018-08-23 DIAGNOSIS — J302 Other seasonal allergic rhinitis: ICD-10-CM

## 2018-08-23 DIAGNOSIS — N3010 Interstitial cystitis (chronic) without hematuria: ICD-10-CM

## 2018-08-23 DIAGNOSIS — R51 Headache: ICD-10-CM

## 2018-08-23 DIAGNOSIS — M1990 Unspecified osteoarthritis, unspecified site: ICD-10-CM

## 2018-08-23 DIAGNOSIS — Z9889 Other specified postprocedural states: Principal | ICD-10-CM

## 2018-08-23 DIAGNOSIS — Z800 Family history of malignant neoplasm of digestive organs: ICD-10-CM

## 2018-08-23 DIAGNOSIS — G4733 Obstructive sleep apnea (adult) (pediatric): ICD-10-CM

## 2018-08-23 DIAGNOSIS — F329 Major depressive disorder, single episode, unspecified: ICD-10-CM

## 2018-08-23 DIAGNOSIS — R079 Chest pain, unspecified: ICD-10-CM

## 2018-08-23 DIAGNOSIS — M549 Dorsalgia, unspecified: ICD-10-CM

## 2018-08-23 DIAGNOSIS — R0600 Dyspnea, unspecified: ICD-10-CM

## 2018-08-23 DIAGNOSIS — R110 Nausea: ICD-10-CM

## 2018-08-23 DIAGNOSIS — I10 Essential (primary) hypertension: Principal | ICD-10-CM

## 2018-08-23 DIAGNOSIS — R1013 Epigastric pain: ICD-10-CM

## 2018-08-23 DIAGNOSIS — E7800 Pure hypercholesterolemia, unspecified: ICD-10-CM

## 2018-08-23 DIAGNOSIS — K859 Acute pancreatitis without necrosis or infection, unspecified: ICD-10-CM

## 2018-08-23 DIAGNOSIS — Z720 Tobacco use: ICD-10-CM

## 2018-08-23 DIAGNOSIS — K219 Gastro-esophageal reflux disease without esophagitis: ICD-10-CM

## 2018-08-23 MED ORDER — SIMETHICONE 40 MG/0.6 ML PO DRPS
0 refills | Status: DC
Start: 2018-08-23 — End: 2018-08-28

## 2018-08-23 MED ORDER — PROPOFOL 10 MG/ML IV EMUL 50 ML (INFUSION)(AM)(OR)
INTRAVENOUS | 0 refills | Status: DC
Start: 2018-08-23 — End: 2018-08-23

## 2018-08-23 MED ORDER — LIDOCAINE (PF) 100 MG/5 ML (2 %) IV SYRG
0 refills | Status: DC
Start: 2018-08-23 — End: 2018-08-23

## 2018-08-23 MED ORDER — ONDANSETRON HCL (PF) 4 MG/2 ML IJ SOLN
4 mg | Freq: Once | INTRAVENOUS | 0 refills | Status: DC | PRN
Start: 2018-08-23 — End: 2018-08-28

## 2018-08-23 MED ORDER — LACTATED RINGERS IV SOLP
INTRAVENOUS | 0 refills | Status: DC
Start: 2018-08-23 — End: 2018-08-28

## 2018-08-23 MED ORDER — FENTANYL CITRATE (PF) 50 MCG/ML IJ SOLN
50 ug | INTRAVENOUS | 0 refills | Status: DC | PRN
Start: 2018-08-23 — End: 2018-08-28

## 2018-08-23 MED ORDER — LIDOCAINE (PF) 10 MG/ML (1 %) IJ SOLN
.1-2 mL | INTRAMUSCULAR | 0 refills | Status: DC | PRN
Start: 2018-08-23 — End: 2018-08-28

## 2018-08-23 MED ORDER — PANTOPRAZOLE 40 MG PO TBEC
40 mg | ORAL_TABLET | Freq: Every day | ORAL | 0 refills | 90.00000 days | Status: AC
Start: 2018-08-23 — End: 2018-09-13

## 2018-08-24 ENCOUNTER — Encounter: Admit: 2018-08-24 | Discharge: 2018-08-24 | Payer: Commercial Managed Care - HMO

## 2018-08-24 DIAGNOSIS — N3010 Interstitial cystitis (chronic) without hematuria: ICD-10-CM

## 2018-08-24 DIAGNOSIS — I10 Essential (primary) hypertension: Secondary | ICD-10-CM

## 2018-08-24 DIAGNOSIS — R0600 Dyspnea, unspecified: ICD-10-CM

## 2018-08-24 DIAGNOSIS — Z720 Tobacco use: ICD-10-CM

## 2018-08-24 DIAGNOSIS — M1990 Unspecified osteoarthritis, unspecified site: ICD-10-CM

## 2018-08-24 DIAGNOSIS — E7800 Pure hypercholesterolemia, unspecified: ICD-10-CM

## 2018-08-24 DIAGNOSIS — K219 Gastro-esophageal reflux disease without esophagitis: ICD-10-CM

## 2018-08-24 DIAGNOSIS — K859 Acute pancreatitis without necrosis or infection, unspecified: ICD-10-CM

## 2018-08-24 DIAGNOSIS — F329 Major depressive disorder, single episode, unspecified: ICD-10-CM

## 2018-08-24 DIAGNOSIS — J302 Other seasonal allergic rhinitis: ICD-10-CM

## 2018-08-24 DIAGNOSIS — R51 Headache: ICD-10-CM

## 2018-08-24 DIAGNOSIS — M549 Dorsalgia, unspecified: ICD-10-CM

## 2018-08-24 DIAGNOSIS — G4733 Obstructive sleep apnea (adult) (pediatric): ICD-10-CM

## 2018-08-26 ENCOUNTER — Encounter: Admit: 2018-08-26 | Discharge: 2018-08-26 | Payer: Commercial Managed Care - HMO

## 2018-08-26 DIAGNOSIS — R69 Illness, unspecified: Principal | ICD-10-CM

## 2018-08-27 ENCOUNTER — Encounter: Admit: 2018-08-27 | Discharge: 2018-08-27 | Payer: Commercial Managed Care - HMO

## 2018-08-27 DIAGNOSIS — R1013 Epigastric pain: ICD-10-CM

## 2018-08-27 DIAGNOSIS — Z9889 Other specified postprocedural states: Principal | ICD-10-CM

## 2018-08-27 DIAGNOSIS — R110 Nausea: ICD-10-CM

## 2018-08-27 DIAGNOSIS — R079 Chest pain, unspecified: ICD-10-CM

## 2018-08-27 MED ORDER — HYDROXYZINE HCL 10 MG PO TAB
ORAL_TABLET | Freq: Every day | ORAL | 0 refills | 30.00000 days | Status: AC
Start: 2018-08-27 — End: 2018-11-18

## 2018-08-27 MED ORDER — HYDROXYZINE HCL 10 MG PO TAB
ORAL_TABLET | Freq: Every day | ORAL | 3 refills | 30.00000 days | Status: AC
Start: 2018-08-27 — End: 2018-08-27

## 2018-08-28 ENCOUNTER — Encounter: Admit: 2018-08-28 | Discharge: 2018-08-28 | Payer: Commercial Managed Care - HMO

## 2018-08-31 ENCOUNTER — Ambulatory Visit: Admit: 2018-08-31 | Discharge: 2018-08-31 | Payer: Commercial Managed Care - HMO

## 2018-08-31 DIAGNOSIS — R079 Chest pain, unspecified: Principal | ICD-10-CM

## 2018-08-31 DIAGNOSIS — R1013 Epigastric pain: ICD-10-CM

## 2018-08-31 DIAGNOSIS — Z9889 Other specified postprocedural states: ICD-10-CM

## 2018-08-31 DIAGNOSIS — R110 Nausea: ICD-10-CM

## 2018-08-31 MED ORDER — SODIUM CHLORIDE 0.9 % IJ SOLN
50 mL | Freq: Once | INTRAVENOUS | 0 refills | Status: CP
Start: 2018-08-31 — End: ?
  Administered 2018-08-31: 20:00:00 50 mL via INTRAVENOUS

## 2018-08-31 MED ORDER — IOHEXOL 350 MG IODINE/ML IV SOLN
100 mL | Freq: Once | INTRAVENOUS | 0 refills | Status: CP
Start: 2018-08-31 — End: ?
  Administered 2018-08-31: 20:00:00 100 mL via INTRAVENOUS

## 2018-09-03 ENCOUNTER — Encounter: Admit: 2018-09-03 | Discharge: 2018-09-03 | Payer: Commercial Managed Care - HMO

## 2018-09-03 DIAGNOSIS — K439 Ventral hernia without obstruction or gangrene: Secondary | ICD-10-CM

## 2018-09-03 DIAGNOSIS — R1013 Epigastric pain: Secondary | ICD-10-CM

## 2018-09-03 DIAGNOSIS — Z9889 Other specified postprocedural states: Secondary | ICD-10-CM

## 2018-09-09 ENCOUNTER — Encounter: Admit: 2018-09-09 | Discharge: 2018-09-09 | Payer: Commercial Managed Care - HMO

## 2018-09-10 ENCOUNTER — Ambulatory Visit: Admit: 2018-09-10 | Discharge: 2018-09-11 | Payer: Commercial Managed Care - HMO

## 2018-09-10 ENCOUNTER — Encounter: Admit: 2018-09-10 | Discharge: 2018-09-10 | Payer: Commercial Managed Care - HMO

## 2018-09-10 DIAGNOSIS — K859 Acute pancreatitis without necrosis or infection, unspecified: Secondary | ICD-10-CM

## 2018-09-10 DIAGNOSIS — K219 Gastro-esophageal reflux disease without esophagitis: Secondary | ICD-10-CM

## 2018-09-10 DIAGNOSIS — F329 Major depressive disorder, single episode, unspecified: Secondary | ICD-10-CM

## 2018-09-10 DIAGNOSIS — R0600 Dyspnea, unspecified: Secondary | ICD-10-CM

## 2018-09-10 DIAGNOSIS — I10 Essential (primary) hypertension: Secondary | ICD-10-CM

## 2018-09-10 DIAGNOSIS — M549 Dorsalgia, unspecified: Secondary | ICD-10-CM

## 2018-09-10 DIAGNOSIS — E7800 Pure hypercholesterolemia, unspecified: Secondary | ICD-10-CM

## 2018-09-10 DIAGNOSIS — Z720 Tobacco use: Secondary | ICD-10-CM

## 2018-09-10 DIAGNOSIS — N3010 Interstitial cystitis (chronic) without hematuria: Secondary | ICD-10-CM

## 2018-09-10 DIAGNOSIS — J302 Other seasonal allergic rhinitis: Secondary | ICD-10-CM

## 2018-09-10 DIAGNOSIS — R51 Headache: Secondary | ICD-10-CM

## 2018-09-10 DIAGNOSIS — G4733 Obstructive sleep apnea (adult) (pediatric): Secondary | ICD-10-CM

## 2018-09-10 DIAGNOSIS — M1990 Unspecified osteoarthritis, unspecified site: Secondary | ICD-10-CM

## 2018-09-11 DIAGNOSIS — Z9889 Other specified postprocedural states: Secondary | ICD-10-CM

## 2018-09-11 DIAGNOSIS — R1013 Epigastric pain: Secondary | ICD-10-CM

## 2018-09-11 DIAGNOSIS — K439 Ventral hernia without obstruction or gangrene: Secondary | ICD-10-CM

## 2018-09-13 ENCOUNTER — Encounter: Admit: 2018-09-13 | Discharge: 2018-09-13 | Payer: Commercial Managed Care - HMO

## 2018-09-13 DIAGNOSIS — K439 Ventral hernia without obstruction or gangrene: ICD-10-CM

## 2018-09-13 MED ORDER — CEFAZOLIN INJ 1GM IVP
2 g | Freq: Once | INTRAVENOUS | 0 refills | Status: CN
Start: 2018-09-13 — End: ?

## 2018-09-13 MED ORDER — PANTOPRAZOLE 40 MG PO TBEC
40 mg | ORAL_TABLET | Freq: Every day | ORAL | 0 refills | 90.00000 days | Status: AC
Start: 2018-09-13 — End: 2018-10-11

## 2018-09-15 ENCOUNTER — Encounter: Admit: 2018-09-15 | Discharge: 2018-09-15 | Payer: Commercial Managed Care - HMO

## 2018-09-15 DIAGNOSIS — Z720 Tobacco use: Secondary | ICD-10-CM

## 2018-09-15 DIAGNOSIS — K219 Gastro-esophageal reflux disease without esophagitis: Secondary | ICD-10-CM

## 2018-09-15 DIAGNOSIS — R0600 Dyspnea, unspecified: Secondary | ICD-10-CM

## 2018-09-15 DIAGNOSIS — M549 Dorsalgia, unspecified: Secondary | ICD-10-CM

## 2018-09-15 DIAGNOSIS — E7800 Pure hypercholesterolemia, unspecified: Secondary | ICD-10-CM

## 2018-09-15 DIAGNOSIS — K859 Acute pancreatitis without necrosis or infection, unspecified: Secondary | ICD-10-CM

## 2018-09-15 DIAGNOSIS — R51 Headache: Secondary | ICD-10-CM

## 2018-09-15 DIAGNOSIS — M1990 Unspecified osteoarthritis, unspecified site: Secondary | ICD-10-CM

## 2018-09-15 DIAGNOSIS — J302 Other seasonal allergic rhinitis: Secondary | ICD-10-CM

## 2018-09-15 DIAGNOSIS — G4733 Obstructive sleep apnea (adult) (pediatric): Secondary | ICD-10-CM

## 2018-09-15 DIAGNOSIS — F329 Major depressive disorder, single episode, unspecified: Secondary | ICD-10-CM

## 2018-09-15 DIAGNOSIS — N3010 Interstitial cystitis (chronic) without hematuria: Secondary | ICD-10-CM

## 2018-09-15 DIAGNOSIS — I10 Essential (primary) hypertension: Secondary | ICD-10-CM

## 2018-09-17 ENCOUNTER — Encounter: Admit: 2018-09-17 | Discharge: 2018-09-17 | Payer: Commercial Managed Care - HMO

## 2018-09-17 ENCOUNTER — Ambulatory Visit: Admit: 2018-09-17 | Discharge: 2018-09-17 | Payer: Commercial Managed Care - HMO

## 2018-09-17 ENCOUNTER — Ambulatory Visit: Admit: 2018-09-17 | Discharge: 2018-09-18 | Payer: Commercial Managed Care - HMO

## 2018-09-17 DIAGNOSIS — K219 Gastro-esophageal reflux disease without esophagitis: Secondary | ICD-10-CM

## 2018-09-17 DIAGNOSIS — F99 Mental disorder, not otherwise specified: Secondary | ICD-10-CM

## 2018-09-17 DIAGNOSIS — G43909 Migraine, unspecified, not intractable, without status migrainosus: Secondary | ICD-10-CM

## 2018-09-17 DIAGNOSIS — M1990 Unspecified osteoarthritis, unspecified site: Secondary | ICD-10-CM

## 2018-09-17 DIAGNOSIS — G4733 Obstructive sleep apnea (adult) (pediatric): Secondary | ICD-10-CM

## 2018-09-17 DIAGNOSIS — Z720 Tobacco use: Secondary | ICD-10-CM

## 2018-09-17 DIAGNOSIS — T753XXA Motion sickness, initial encounter: Secondary | ICD-10-CM

## 2018-09-17 DIAGNOSIS — R0600 Dyspnea, unspecified: Secondary | ICD-10-CM

## 2018-09-17 DIAGNOSIS — K859 Acute pancreatitis without necrosis or infection, unspecified: Secondary | ICD-10-CM

## 2018-09-17 DIAGNOSIS — M549 Dorsalgia, unspecified: Secondary | ICD-10-CM

## 2018-09-17 DIAGNOSIS — J302 Other seasonal allergic rhinitis: Secondary | ICD-10-CM

## 2018-09-17 DIAGNOSIS — N3010 Interstitial cystitis (chronic) without hematuria: Secondary | ICD-10-CM

## 2018-09-17 DIAGNOSIS — E7800 Pure hypercholesterolemia, unspecified: Secondary | ICD-10-CM

## 2018-09-17 DIAGNOSIS — R51 Headache: Secondary | ICD-10-CM

## 2018-09-17 DIAGNOSIS — I10 Essential (primary) hypertension: Secondary | ICD-10-CM

## 2018-09-17 DIAGNOSIS — R42 Dizziness and giddiness: Secondary | ICD-10-CM

## 2018-09-18 DIAGNOSIS — Z01810 Encounter for preprocedural cardiovascular examination: Secondary | ICD-10-CM

## 2018-09-22 ENCOUNTER — Encounter: Admit: 2018-09-22 | Discharge: 2018-09-22 | Payer: Commercial Managed Care - HMO

## 2018-09-22 DIAGNOSIS — Z720 Tobacco use: Secondary | ICD-10-CM

## 2018-09-22 DIAGNOSIS — M549 Dorsalgia, unspecified: Secondary | ICD-10-CM

## 2018-09-22 DIAGNOSIS — R0600 Dyspnea, unspecified: Secondary | ICD-10-CM

## 2018-09-22 DIAGNOSIS — E7800 Pure hypercholesterolemia, unspecified: Secondary | ICD-10-CM

## 2018-09-22 DIAGNOSIS — K219 Gastro-esophageal reflux disease without esophagitis: Secondary | ICD-10-CM

## 2018-09-22 DIAGNOSIS — R51 Headache: Secondary | ICD-10-CM

## 2018-09-22 DIAGNOSIS — I10 Essential (primary) hypertension: Secondary | ICD-10-CM

## 2018-09-22 DIAGNOSIS — K859 Acute pancreatitis without necrosis or infection, unspecified: Secondary | ICD-10-CM

## 2018-09-22 DIAGNOSIS — J302 Other seasonal allergic rhinitis: Secondary | ICD-10-CM

## 2018-09-22 DIAGNOSIS — R42 Dizziness and giddiness: Secondary | ICD-10-CM

## 2018-09-22 DIAGNOSIS — N3010 Interstitial cystitis (chronic) without hematuria: Secondary | ICD-10-CM

## 2018-09-22 DIAGNOSIS — G4733 Obstructive sleep apnea (adult) (pediatric): Secondary | ICD-10-CM

## 2018-09-22 DIAGNOSIS — F99 Mental disorder, not otherwise specified: Secondary | ICD-10-CM

## 2018-09-22 DIAGNOSIS — G43909 Migraine, unspecified, not intractable, without status migrainosus: Secondary | ICD-10-CM

## 2018-09-22 DIAGNOSIS — T753XXA Motion sickness, initial encounter: Secondary | ICD-10-CM

## 2018-09-22 DIAGNOSIS — M1990 Unspecified osteoarthritis, unspecified site: Secondary | ICD-10-CM

## 2018-09-23 ENCOUNTER — Ambulatory Visit: Admit: 2018-09-23 | Discharge: 2018-09-23 | Payer: Commercial Managed Care - HMO

## 2018-09-23 ENCOUNTER — Encounter: Admit: 2018-09-23 | Discharge: 2018-09-23 | Payer: Commercial Managed Care - HMO

## 2018-09-23 DIAGNOSIS — R0600 Dyspnea, unspecified: Secondary | ICD-10-CM

## 2018-09-23 DIAGNOSIS — Z888 Allergy status to other drugs, medicaments and biological substances status: Secondary | ICD-10-CM

## 2018-09-23 DIAGNOSIS — N3010 Interstitial cystitis (chronic) without hematuria: Secondary | ICD-10-CM

## 2018-09-23 DIAGNOSIS — M549 Dorsalgia, unspecified: Secondary | ICD-10-CM

## 2018-09-23 DIAGNOSIS — K219 Gastro-esophageal reflux disease without esophagitis: Secondary | ICD-10-CM

## 2018-09-23 DIAGNOSIS — G43909 Migraine, unspecified, not intractable, without status migrainosus: Secondary | ICD-10-CM

## 2018-09-23 DIAGNOSIS — F99 Mental disorder, not otherwise specified: Secondary | ICD-10-CM

## 2018-09-23 DIAGNOSIS — G4733 Obstructive sleep apnea (adult) (pediatric): Secondary | ICD-10-CM

## 2018-09-23 DIAGNOSIS — M1990 Unspecified osteoarthritis, unspecified site: Secondary | ICD-10-CM

## 2018-09-23 DIAGNOSIS — E7800 Pure hypercholesterolemia, unspecified: Secondary | ICD-10-CM

## 2018-09-23 DIAGNOSIS — J302 Other seasonal allergic rhinitis: Secondary | ICD-10-CM

## 2018-09-23 DIAGNOSIS — K429 Umbilical hernia without obstruction or gangrene: Secondary | ICD-10-CM

## 2018-09-23 DIAGNOSIS — R51 Headache: Secondary | ICD-10-CM

## 2018-09-23 DIAGNOSIS — R42 Dizziness and giddiness: Secondary | ICD-10-CM

## 2018-09-23 DIAGNOSIS — K859 Acute pancreatitis without necrosis or infection, unspecified: Secondary | ICD-10-CM

## 2018-09-23 DIAGNOSIS — I10 Essential (primary) hypertension: Secondary | ICD-10-CM

## 2018-09-23 DIAGNOSIS — T753XXA Motion sickness, initial encounter: Secondary | ICD-10-CM

## 2018-09-23 DIAGNOSIS — Z720 Tobacco use: Secondary | ICD-10-CM

## 2018-09-23 MED ORDER — PROPOFOL INJ 10 MG/ML IV VIAL
0 refills | Status: DC
Start: 2018-09-23 — End: 2018-09-23
  Administered 2018-09-23: 15:00:00 200 mg via INTRAVENOUS

## 2018-09-23 MED ORDER — ACETAMINOPHEN 325 MG PO TAB
650 mg | Freq: Once | ORAL | 0 refills | Status: CP
Start: 2018-09-23 — End: ?
  Administered 2018-09-23: 15:00:00 650 mg via ORAL

## 2018-09-23 MED ORDER — ONDANSETRON HCL (PF) 4 MG/2 ML IJ SOLN
INTRAVENOUS | 0 refills | Status: DC
Start: 2018-09-23 — End: 2018-09-23
  Administered 2018-09-23: 16:00:00 4 mg via INTRAVENOUS

## 2018-09-23 MED ORDER — OXYCODONE 5 MG PO TAB
5-10 mg | Freq: Once | ORAL | 0 refills | Status: CP | PRN
Start: 2018-09-23 — End: ?
  Administered 2018-09-23: 16:00:00 10 mg via ORAL

## 2018-09-23 MED ORDER — FENTANYL CITRATE (PF) 50 MCG/ML IJ SOLN
50 ug | INTRAVENOUS | 0 refills | Status: DC | PRN
Start: 2018-09-23 — End: 2018-09-23

## 2018-09-23 MED ORDER — FENTANYL CITRATE (PF) 50 MCG/ML IJ SOLN
0 refills | Status: DC
Start: 2018-09-23 — End: 2018-09-23
  Administered 2018-09-23: 15:00:00 50 ug via INTRAVENOUS

## 2018-09-23 MED ORDER — LIDOCAINE HCL 10 MG/ML (1 %) IJ SOLN
0 refills | Status: DC
Start: 2018-09-23 — End: 2018-09-23
  Administered 2018-09-23: 16:00:00 5 mL via INTRAMUSCULAR

## 2018-09-23 MED ORDER — OXYCODONE-ACETAMINOPHEN 5-325 MG PO TAB
1-2 | ORAL_TABLET | ORAL | 0 refills | 2.00000 days | Status: AC | PRN
Start: 2018-09-23 — End: 2019-02-10

## 2018-09-23 MED ORDER — FENTANYL CITRATE (PF) 50 MCG/ML IJ SOLN
25 ug | INTRAVENOUS | 0 refills | Status: DC | PRN
Start: 2018-09-23 — End: 2018-09-23
  Administered 2018-09-23 (×2): 25 ug via INTRAVENOUS

## 2018-09-23 MED ORDER — HALOPERIDOL LACTATE 5 MG/ML IJ SOLN
1 mg | Freq: Once | INTRAVENOUS | 0 refills | Status: CP | PRN
Start: 2018-09-23 — End: ?
  Administered 2018-09-23: 17:00:00 1 mg via INTRAVENOUS

## 2018-09-23 MED ORDER — DOCUSATE SODIUM 100 MG PO CAP
100 mg | ORAL_CAPSULE | Freq: Two times a day (BID) | ORAL | 0 refills | Status: AC
Start: 2018-09-23 — End: 2019-02-10

## 2018-09-23 MED ORDER — FENTANYL CITRATE (PF) 50 MCG/ML IJ SOLN
25-50 ug | INTRAVENOUS | 0 refills | Status: DC | PRN
Start: 2018-09-23 — End: 2018-09-23

## 2018-09-23 MED ORDER — LACTATED RINGERS IV SOLP
1000 mL | INTRAVENOUS | 0 refills | Status: DC
Start: 2018-09-23 — End: 2018-09-23

## 2018-09-23 MED ORDER — CEFAZOLIN INJ 1GM IVP
2 g | Freq: Once | INTRAVENOUS | 0 refills | Status: CP
Start: 2018-09-23 — End: ?
  Administered 2018-09-23: 15:00:00 2 g via INTRAVENOUS

## 2018-09-23 MED ORDER — SUGAMMADEX 100 MG/ML IV SOLN
INTRAVENOUS | 0 refills | Status: DC
Start: 2018-09-23 — End: 2018-09-23
  Administered 2018-09-23: 16:00:00 250 mg via INTRAVENOUS

## 2018-09-23 MED ORDER — LIDOCAINE (PF) 200 MG/10 ML (2 %) IJ SYRG
0 refills | Status: DC
Start: 2018-09-23 — End: 2018-09-23
  Administered 2018-09-23: 15:00:00 120 mg via INTRAVENOUS

## 2018-09-23 MED ORDER — BUPIVACAINE 0.25 % (2.5 MG/ML) IJ SOLN
0 refills | Status: DC
Start: 2018-09-23 — End: 2018-09-23
  Administered 2018-09-23: 16:00:00 5 mL via INTRAMUSCULAR

## 2018-09-23 MED ORDER — DEXTRAN 70-HYPROMELLOSE (PF) 0.1-0.3 % OP DPET
0 refills | Status: DC
Start: 2018-09-23 — End: 2018-09-23
  Administered 2018-09-23: 15:00:00 1 [drp] via OPHTHALMIC

## 2018-09-23 MED ORDER — ROCURONIUM 10 MG/ML IV SOLN
INTRAVENOUS | 0 refills | Status: DC
Start: 2018-09-23 — End: 2018-09-23
  Administered 2018-09-23: 15:00:00 40 mg via INTRAVENOUS

## 2018-09-23 MED ORDER — LIDOCAINE (PF) 10 MG/ML (1 %) IJ SOLN
.1-2 mL | INTRAMUSCULAR | 0 refills | Status: DC | PRN
Start: 2018-09-23 — End: 2018-09-23

## 2018-09-23 MED ADMIN — LACTATED RINGERS IV SOLP [4318]: 1000 mL | INTRAVENOUS | @ 15:00:00 | Stop: 2018-09-23 | NDC 00338011704

## 2018-09-25 ENCOUNTER — Encounter: Admit: 2018-09-25 | Discharge: 2018-09-25 | Payer: Commercial Managed Care - HMO

## 2018-09-25 DIAGNOSIS — K219 Gastro-esophageal reflux disease without esophagitis: Secondary | ICD-10-CM

## 2018-09-25 DIAGNOSIS — Z720 Tobacco use: Secondary | ICD-10-CM

## 2018-09-25 DIAGNOSIS — K859 Acute pancreatitis without necrosis or infection, unspecified: Secondary | ICD-10-CM

## 2018-09-25 DIAGNOSIS — J302 Other seasonal allergic rhinitis: Secondary | ICD-10-CM

## 2018-09-25 DIAGNOSIS — E7800 Pure hypercholesterolemia, unspecified: Secondary | ICD-10-CM

## 2018-09-25 DIAGNOSIS — G4733 Obstructive sleep apnea (adult) (pediatric): Secondary | ICD-10-CM

## 2018-09-25 DIAGNOSIS — F99 Mental disorder, not otherwise specified: Secondary | ICD-10-CM

## 2018-09-25 DIAGNOSIS — R51 Headache: Secondary | ICD-10-CM

## 2018-09-25 DIAGNOSIS — R0600 Dyspnea, unspecified: Secondary | ICD-10-CM

## 2018-09-25 DIAGNOSIS — G43909 Migraine, unspecified, not intractable, without status migrainosus: Secondary | ICD-10-CM

## 2018-09-25 DIAGNOSIS — M549 Dorsalgia, unspecified: Secondary | ICD-10-CM

## 2018-09-25 DIAGNOSIS — I10 Essential (primary) hypertension: Principal | ICD-10-CM

## 2018-09-25 DIAGNOSIS — N3010 Interstitial cystitis (chronic) without hematuria: Secondary | ICD-10-CM

## 2018-09-25 DIAGNOSIS — M1990 Unspecified osteoarthritis, unspecified site: Secondary | ICD-10-CM

## 2018-09-25 DIAGNOSIS — T753XXA Motion sickness, initial encounter: Secondary | ICD-10-CM

## 2018-09-25 DIAGNOSIS — R42 Dizziness and giddiness: Secondary | ICD-10-CM

## 2018-09-29 ENCOUNTER — Encounter: Admit: 2018-09-29 | Discharge: 2018-09-29 | Payer: Commercial Managed Care - HMO

## 2018-09-29 ENCOUNTER — Ambulatory Visit: Admit: 2018-09-29 | Discharge: 2018-09-30 | Payer: Commercial Managed Care - HMO

## 2018-09-29 DIAGNOSIS — N3010 Interstitial cystitis (chronic) without hematuria: Secondary | ICD-10-CM

## 2018-09-29 DIAGNOSIS — J302 Other seasonal allergic rhinitis: Secondary | ICD-10-CM

## 2018-09-29 DIAGNOSIS — Z8371 Family history of colonic polyps: Secondary | ICD-10-CM

## 2018-09-29 DIAGNOSIS — T753XXA Motion sickness, initial encounter: Secondary | ICD-10-CM

## 2018-09-29 DIAGNOSIS — R42 Dizziness and giddiness: Secondary | ICD-10-CM

## 2018-09-29 DIAGNOSIS — R0789 Other chest pain: Secondary | ICD-10-CM

## 2018-09-29 DIAGNOSIS — M549 Dorsalgia, unspecified: Secondary | ICD-10-CM

## 2018-09-29 DIAGNOSIS — G43909 Migraine, unspecified, not intractable, without status migrainosus: Secondary | ICD-10-CM

## 2018-09-29 DIAGNOSIS — R0600 Dyspnea, unspecified: Secondary | ICD-10-CM

## 2018-09-29 DIAGNOSIS — G4733 Obstructive sleep apnea (adult) (pediatric): Secondary | ICD-10-CM

## 2018-09-29 DIAGNOSIS — M1990 Unspecified osteoarthritis, unspecified site: Secondary | ICD-10-CM

## 2018-09-29 DIAGNOSIS — E7800 Pure hypercholesterolemia, unspecified: Secondary | ICD-10-CM

## 2018-09-29 DIAGNOSIS — I10 Essential (primary) hypertension: Principal | ICD-10-CM

## 2018-09-29 DIAGNOSIS — K5909 Other constipation: Secondary | ICD-10-CM

## 2018-09-29 DIAGNOSIS — R51 Headache: Secondary | ICD-10-CM

## 2018-09-29 DIAGNOSIS — K859 Acute pancreatitis without necrosis or infection, unspecified: Secondary | ICD-10-CM

## 2018-09-29 DIAGNOSIS — R079 Chest pain, unspecified: Secondary | ICD-10-CM

## 2018-09-29 DIAGNOSIS — Z720 Tobacco use: Secondary | ICD-10-CM

## 2018-09-29 DIAGNOSIS — K219 Gastro-esophageal reflux disease without esophagitis: Secondary | ICD-10-CM

## 2018-09-29 DIAGNOSIS — F99 Mental disorder, not otherwise specified: Secondary | ICD-10-CM

## 2018-09-29 DIAGNOSIS — K439 Ventral hernia without obstruction or gangrene: Secondary | ICD-10-CM

## 2018-09-29 MED ORDER — SODIUM CHLORIDE 0.9 % IV SOLP
INTRAVENOUS | 0 refills | Status: CN
Start: 2018-09-29 — End: ?

## 2018-09-30 ENCOUNTER — Encounter: Admit: 2018-09-30 | Discharge: 2018-09-30 | Payer: Commercial Managed Care - HMO

## 2018-09-30 DIAGNOSIS — M549 Dorsalgia, unspecified: Secondary | ICD-10-CM

## 2018-09-30 DIAGNOSIS — G43909 Migraine, unspecified, not intractable, without status migrainosus: Secondary | ICD-10-CM

## 2018-09-30 DIAGNOSIS — R51 Headache: Secondary | ICD-10-CM

## 2018-09-30 DIAGNOSIS — I10 Essential (primary) hypertension: Secondary | ICD-10-CM

## 2018-09-30 DIAGNOSIS — T753XXA Motion sickness, initial encounter: Secondary | ICD-10-CM

## 2018-09-30 DIAGNOSIS — Z720 Tobacco use: Secondary | ICD-10-CM

## 2018-09-30 DIAGNOSIS — K219 Gastro-esophageal reflux disease without esophagitis: Secondary | ICD-10-CM

## 2018-09-30 DIAGNOSIS — J302 Other seasonal allergic rhinitis: Secondary | ICD-10-CM

## 2018-09-30 DIAGNOSIS — M1990 Unspecified osteoarthritis, unspecified site: Secondary | ICD-10-CM

## 2018-09-30 DIAGNOSIS — R0600 Dyspnea, unspecified: Secondary | ICD-10-CM

## 2018-09-30 DIAGNOSIS — K859 Acute pancreatitis without necrosis or infection, unspecified: Secondary | ICD-10-CM

## 2018-09-30 DIAGNOSIS — R0789 Other chest pain: ICD-10-CM

## 2018-09-30 DIAGNOSIS — G4733 Obstructive sleep apnea (adult) (pediatric): Secondary | ICD-10-CM

## 2018-09-30 DIAGNOSIS — F99 Mental disorder, not otherwise specified: Secondary | ICD-10-CM

## 2018-09-30 DIAGNOSIS — E7800 Pure hypercholesterolemia, unspecified: Secondary | ICD-10-CM

## 2018-09-30 DIAGNOSIS — N3010 Interstitial cystitis (chronic) without hematuria: Secondary | ICD-10-CM

## 2018-09-30 DIAGNOSIS — R42 Dizziness and giddiness: Secondary | ICD-10-CM

## 2018-09-30 MED ORDER — ROSUVASTATIN 20 MG PO TAB
ORAL_TABLET | Freq: Every day | ORAL | 3 refills | 90.00000 days | Status: AC
Start: 2018-09-30 — End: 2019-08-18

## 2018-10-07 ENCOUNTER — Encounter: Admit: 2018-10-07 | Discharge: 2018-10-07 | Payer: Commercial Managed Care - HMO

## 2018-10-07 ENCOUNTER — Ambulatory Visit: Admit: 2018-10-07 | Discharge: 2018-10-07 | Payer: Commercial Managed Care - HMO

## 2018-10-07 DIAGNOSIS — M1990 Unspecified osteoarthritis, unspecified site: ICD-10-CM

## 2018-10-07 DIAGNOSIS — R079 Chest pain, unspecified: Principal | ICD-10-CM

## 2018-10-07 DIAGNOSIS — N3010 Interstitial cystitis (chronic) without hematuria: ICD-10-CM

## 2018-10-07 DIAGNOSIS — K859 Acute pancreatitis without necrosis or infection, unspecified: ICD-10-CM

## 2018-10-07 DIAGNOSIS — R42 Dizziness and giddiness: ICD-10-CM

## 2018-10-07 DIAGNOSIS — M549 Dorsalgia, unspecified: ICD-10-CM

## 2018-10-07 DIAGNOSIS — R51 Headache: ICD-10-CM

## 2018-10-07 DIAGNOSIS — Z9889 Other specified postprocedural states: ICD-10-CM

## 2018-10-07 DIAGNOSIS — T753XXA Motion sickness, initial encounter: ICD-10-CM

## 2018-10-07 DIAGNOSIS — G4733 Obstructive sleep apnea (adult) (pediatric): ICD-10-CM

## 2018-10-07 DIAGNOSIS — Z720 Tobacco use: ICD-10-CM

## 2018-10-07 DIAGNOSIS — R1013 Epigastric pain: ICD-10-CM

## 2018-10-07 DIAGNOSIS — K219 Gastro-esophageal reflux disease without esophagitis: ICD-10-CM

## 2018-10-07 DIAGNOSIS — G44039 Episodic paroxysmal hemicrania, not intractable: ICD-10-CM

## 2018-10-07 DIAGNOSIS — R110 Nausea: ICD-10-CM

## 2018-10-07 DIAGNOSIS — F99 Mental disorder, not otherwise specified: ICD-10-CM

## 2018-10-07 DIAGNOSIS — E7800 Pure hypercholesterolemia, unspecified: ICD-10-CM

## 2018-10-07 DIAGNOSIS — G43909 Migraine, unspecified, not intractable, without status migrainosus: ICD-10-CM

## 2018-10-07 DIAGNOSIS — J302 Other seasonal allergic rhinitis: ICD-10-CM

## 2018-10-07 DIAGNOSIS — R0600 Dyspnea, unspecified: ICD-10-CM

## 2018-10-07 DIAGNOSIS — I10 Essential (primary) hypertension: Secondary | ICD-10-CM

## 2018-10-07 DIAGNOSIS — G44209 Tension-type headache, unspecified, not intractable: ICD-10-CM

## 2018-10-07 MED ORDER — SCOPOLAMINE BASE 1 MG OVER 3 DAYS TD PT3D
1 | MEDICATED_PATCH | TRANSDERMAL | 3 refills | Status: AC
Start: 2018-10-07 — End: ?

## 2018-10-07 MED ORDER — SODIUM CHLORIDE 0.9 % IJ SOLN
50 mL | Freq: Once | INTRAVENOUS | 0 refills | Status: CP
Start: 2018-10-07 — End: ?
  Administered 2018-10-08: 50 mL via INTRAVENOUS

## 2018-10-07 MED ORDER — TOPIRAMATE 25 MG PO TAB
25 mg | ORAL_TABLET | Freq: Two times a day (BID) | ORAL | 5 refills | Status: AC
Start: 2018-10-07 — End: 2019-02-10

## 2018-10-07 MED ORDER — IOHEXOL 350 MG IODINE/ML IV SOLN
80 mL | Freq: Once | INTRAVENOUS | 0 refills | Status: CP
Start: 2018-10-07 — End: ?
  Administered 2018-10-08: 80 mL via INTRAVENOUS

## 2018-10-08 ENCOUNTER — Encounter: Admit: 2018-10-08 | Discharge: 2018-10-08 | Payer: Commercial Managed Care - HMO

## 2018-10-08 ENCOUNTER — Ambulatory Visit: Admit: 2018-10-08 | Discharge: 2018-10-09 | Payer: Commercial Managed Care - HMO

## 2018-10-08 DIAGNOSIS — G4733 Obstructive sleep apnea (adult) (pediatric): ICD-10-CM

## 2018-10-08 DIAGNOSIS — J302 Other seasonal allergic rhinitis: ICD-10-CM

## 2018-10-08 DIAGNOSIS — M549 Dorsalgia, unspecified: ICD-10-CM

## 2018-10-08 DIAGNOSIS — R51 Headache: ICD-10-CM

## 2018-10-08 DIAGNOSIS — F99 Mental disorder, not otherwise specified: ICD-10-CM

## 2018-10-08 DIAGNOSIS — R0600 Dyspnea, unspecified: ICD-10-CM

## 2018-10-08 DIAGNOSIS — K219 Gastro-esophageal reflux disease without esophagitis: ICD-10-CM

## 2018-10-08 DIAGNOSIS — E7800 Pure hypercholesterolemia, unspecified: ICD-10-CM

## 2018-10-08 DIAGNOSIS — Z9889 Other specified postprocedural states: Principal | ICD-10-CM

## 2018-10-08 DIAGNOSIS — R42 Dizziness and giddiness: ICD-10-CM

## 2018-10-08 DIAGNOSIS — Z720 Tobacco use: ICD-10-CM

## 2018-10-08 DIAGNOSIS — M1990 Unspecified osteoarthritis, unspecified site: ICD-10-CM

## 2018-10-08 DIAGNOSIS — I10 Essential (primary) hypertension: ICD-10-CM

## 2018-10-08 DIAGNOSIS — G43909 Migraine, unspecified, not intractable, without status migrainosus: ICD-10-CM

## 2018-10-08 DIAGNOSIS — N3010 Interstitial cystitis (chronic) without hematuria: ICD-10-CM

## 2018-10-08 DIAGNOSIS — K859 Acute pancreatitis without necrosis or infection, unspecified: ICD-10-CM

## 2018-10-08 DIAGNOSIS — T753XXA Motion sickness, initial encounter: ICD-10-CM

## 2018-10-08 MED ORDER — CLONAZEPAM 1 MG PO TAB
1 mg | ORAL_TABLET | ORAL | 5 refills | Status: AC | PRN
Start: 2018-10-08 — End: 2019-05-27

## 2018-10-08 MED ORDER — CLONAZEPAM 0.5 MG PO TAB
.75 mg | ORAL_TABLET | Freq: Two times a day (BID) | ORAL | 5 refills | Status: AC
Start: 2018-10-08 — End: 2019-05-27

## 2018-10-08 MED ORDER — PANTOPRAZOLE 40 MG PO TBEC
ORAL_TABLET | Freq: Every day | 0 refills
Start: 2018-10-08 — End: ?

## 2018-10-08 MED ORDER — PANTOPRAZOLE 40 MG PO TBEC
40 mg | ORAL_TABLET | Freq: Two times a day (BID) | ORAL | 5 refills | 90.00000 days | Status: AC
Start: 2018-10-08 — End: 2018-11-01

## 2018-10-11 ENCOUNTER — Encounter: Admit: 2018-10-11 | Discharge: 2018-10-11 | Payer: Commercial Managed Care - HMO

## 2018-10-11 MED ORDER — PANTOPRAZOLE 40 MG PO TBEC
40 mg | ORAL_TABLET | Freq: Every day | ORAL | 0 refills | 90.00000 days | Status: AC
Start: 2018-10-11 — End: 2018-11-01

## 2018-10-12 ENCOUNTER — Encounter: Admit: 2018-10-12 | Discharge: 2018-10-12 | Payer: Commercial Managed Care - HMO

## 2018-10-13 ENCOUNTER — Encounter: Admit: 2018-10-13 | Discharge: 2018-10-13 | Payer: Commercial Managed Care - HMO

## 2018-10-13 DIAGNOSIS — K859 Acute pancreatitis without necrosis or infection, unspecified: ICD-10-CM

## 2018-10-13 DIAGNOSIS — N3010 Interstitial cystitis (chronic) without hematuria: ICD-10-CM

## 2018-10-13 DIAGNOSIS — E7800 Pure hypercholesterolemia, unspecified: ICD-10-CM

## 2018-10-13 DIAGNOSIS — I10 Essential (primary) hypertension: Principal | ICD-10-CM

## 2018-10-13 DIAGNOSIS — G43909 Migraine, unspecified, not intractable, without status migrainosus: ICD-10-CM

## 2018-10-13 DIAGNOSIS — T753XXA Motion sickness, initial encounter: ICD-10-CM

## 2018-10-13 DIAGNOSIS — M549 Dorsalgia, unspecified: ICD-10-CM

## 2018-10-13 DIAGNOSIS — Z720 Tobacco use: ICD-10-CM

## 2018-10-13 DIAGNOSIS — K219 Gastro-esophageal reflux disease without esophagitis: ICD-10-CM

## 2018-10-13 DIAGNOSIS — R51 Headache: ICD-10-CM

## 2018-10-13 DIAGNOSIS — M1990 Unspecified osteoarthritis, unspecified site: ICD-10-CM

## 2018-10-13 DIAGNOSIS — G4733 Obstructive sleep apnea (adult) (pediatric): ICD-10-CM

## 2018-10-13 DIAGNOSIS — R42 Dizziness and giddiness: ICD-10-CM

## 2018-10-13 DIAGNOSIS — R0600 Dyspnea, unspecified: ICD-10-CM

## 2018-10-13 DIAGNOSIS — F99 Mental disorder, not otherwise specified: ICD-10-CM

## 2018-10-13 DIAGNOSIS — J302 Other seasonal allergic rhinitis: ICD-10-CM

## 2018-10-21 NOTE — Telephone Encounter
PA was Submitted through [email or web address]  Key Code:[id number]. Can take 24-72 hours for determination. If you have any questions you can contact [phone].  Marcine Matar, MA

## 2018-10-29 ENCOUNTER — Encounter: Admit: 2018-10-29 | Discharge: 2018-10-29 | Payer: Commercial Managed Care - HMO

## 2018-11-01 ENCOUNTER — Encounter: Admit: 2018-11-01 | Discharge: 2018-11-01 | Payer: Commercial Managed Care - HMO

## 2018-11-01 MED ORDER — PANTOPRAZOLE 40 MG PO TBEC
40 mg | ORAL_TABLET | Freq: Every day | ORAL | 11 refills | 90.00000 days | Status: AC
Start: 2018-11-01 — End: 2019-07-19

## 2018-11-05 ENCOUNTER — Ambulatory Visit: Admit: 2018-11-05 | Discharge: 2018-11-06 | Payer: Commercial Managed Care - HMO

## 2018-11-05 ENCOUNTER — Encounter: Admit: 2018-11-05 | Discharge: 2018-11-05 | Payer: Commercial Managed Care - HMO

## 2018-11-05 DIAGNOSIS — E7800 Pure hypercholesterolemia, unspecified: ICD-10-CM

## 2018-11-05 DIAGNOSIS — M549 Dorsalgia, unspecified: ICD-10-CM

## 2018-11-05 DIAGNOSIS — J302 Other seasonal allergic rhinitis: ICD-10-CM

## 2018-11-05 DIAGNOSIS — R0600 Dyspnea, unspecified: ICD-10-CM

## 2018-11-05 DIAGNOSIS — G43909 Migraine, unspecified, not intractable, without status migrainosus: ICD-10-CM

## 2018-11-05 DIAGNOSIS — T753XXA Motion sickness, initial encounter: ICD-10-CM

## 2018-11-05 DIAGNOSIS — F99 Mental disorder, not otherwise specified: ICD-10-CM

## 2018-11-05 DIAGNOSIS — M1990 Unspecified osteoarthritis, unspecified site: ICD-10-CM

## 2018-11-05 DIAGNOSIS — R42 Dizziness and giddiness: ICD-10-CM

## 2018-11-05 DIAGNOSIS — N3010 Interstitial cystitis (chronic) without hematuria: ICD-10-CM

## 2018-11-05 DIAGNOSIS — I10 Essential (primary) hypertension: ICD-10-CM

## 2018-11-05 DIAGNOSIS — K859 Acute pancreatitis without necrosis or infection, unspecified: ICD-10-CM

## 2018-11-05 DIAGNOSIS — K219 Gastro-esophageal reflux disease without esophagitis: ICD-10-CM

## 2018-11-05 DIAGNOSIS — Z720 Tobacco use: ICD-10-CM

## 2018-11-05 DIAGNOSIS — G4733 Obstructive sleep apnea (adult) (pediatric): ICD-10-CM

## 2018-11-05 DIAGNOSIS — R51 Headache: ICD-10-CM

## 2018-11-05 DIAGNOSIS — Z9889 Other specified postprocedural states: Principal | ICD-10-CM

## 2018-11-05 NOTE — Progress Notes
Subjective:      Christina Garrison returns to clinic today for her final post op visit from Umbilical hernia repair on 1/23.  Patient has no new complaints. She does endorse some mild tenderness still. She denies any fevers, chills, constipation, diarrhea, nausea, or vomiting.       Objective:      There were no vitals filed for this visit.     General:  alert, well-nourished and cooperative   Abdomen: Soft, non-tender, non-distended   Incision:   healing well, no drainage, no erythema, no hernia, no dehiscence, incision well approximated       Assessment:      Joliana E Hamor is a 48 year old F here for post op visit from Umbilical hernia repair on 1/23. She is recovering well and healing as expected.      Plan:      1. Continue any current medications.  2. Wound care discussed.  3. No further restrictions  4. RTC PRN

## 2018-11-08 ENCOUNTER — Encounter: Admit: 2018-11-08 | Discharge: 2018-11-08 | Payer: Commercial Managed Care - HMO

## 2018-11-08 NOTE — Telephone Encounter
Pre procedure call made to pt regarding Esophageal Manometry and 24hr pH test off PPI on 11/10/18. Pt had not stopped her PPI meds. GI Clinic nurse confirmed that pt should be off PPI meds x 2 weeks. Informed pt we need to reschedule the procedure. Schedulers will be calling her to reschedule anytime past 03/23. Pt will continue to remain off PPI meds staring 11/08/18.

## 2018-11-18 ENCOUNTER — Encounter: Admit: 2018-11-18 | Discharge: 2018-11-18 | Payer: Commercial Managed Care - HMO

## 2018-11-18 MED ORDER — HYDROXYZINE HCL 10 MG PO TAB
ORAL_TABLET | Freq: Every day | ORAL | 0 refills | 30.00000 days | Status: AC | PRN
Start: 2018-11-18 — End: 2019-02-10

## 2018-11-18 NOTE — Telephone Encounter
Refill request for Atarax. Per protocol refill approved and sent to [facility]

## 2018-11-25 ENCOUNTER — Encounter: Admit: 2018-11-25 | Discharge: 2018-11-25 | Payer: Commercial Managed Care - HMO

## 2018-11-25 DIAGNOSIS — M549 Dorsalgia, unspecified: ICD-10-CM

## 2018-11-25 DIAGNOSIS — K859 Acute pancreatitis without necrosis or infection, unspecified: ICD-10-CM

## 2018-11-25 DIAGNOSIS — R51 Headache: ICD-10-CM

## 2018-11-25 DIAGNOSIS — F99 Mental disorder, not otherwise specified: ICD-10-CM

## 2018-11-25 DIAGNOSIS — K219 Gastro-esophageal reflux disease without esophagitis: ICD-10-CM

## 2018-11-25 DIAGNOSIS — I10 Essential (primary) hypertension: Principal | ICD-10-CM

## 2018-11-25 DIAGNOSIS — G43909 Migraine, unspecified, not intractable, without status migrainosus: ICD-10-CM

## 2018-11-25 DIAGNOSIS — G4733 Obstructive sleep apnea (adult) (pediatric): ICD-10-CM

## 2018-11-25 DIAGNOSIS — N3010 Interstitial cystitis (chronic) without hematuria: ICD-10-CM

## 2018-11-25 DIAGNOSIS — R0600 Dyspnea, unspecified: ICD-10-CM

## 2018-11-25 DIAGNOSIS — M1990 Unspecified osteoarthritis, unspecified site: ICD-10-CM

## 2018-11-25 DIAGNOSIS — Z720 Tobacco use: ICD-10-CM

## 2018-11-25 DIAGNOSIS — J302 Other seasonal allergic rhinitis: ICD-10-CM

## 2018-11-25 DIAGNOSIS — E7800 Pure hypercholesterolemia, unspecified: ICD-10-CM

## 2018-11-25 DIAGNOSIS — T753XXA Motion sickness, initial encounter: ICD-10-CM

## 2018-11-25 DIAGNOSIS — R42 Dizziness and giddiness: ICD-10-CM

## 2018-12-13 ENCOUNTER — Encounter: Admit: 2018-12-13 | Discharge: 2018-12-13 | Payer: Commercial Managed Care - HMO

## 2018-12-16 ENCOUNTER — Encounter: Admit: 2018-12-16 | Discharge: 2018-12-16 | Payer: Commercial Managed Care - HMO

## 2018-12-16 ENCOUNTER — Ambulatory Visit: Admit: 2018-12-16 | Discharge: 2018-12-17 | Payer: Commercial Managed Care - HMO

## 2018-12-16 DIAGNOSIS — T753XXA Motion sickness, initial encounter: ICD-10-CM

## 2018-12-16 DIAGNOSIS — R0600 Dyspnea, unspecified: ICD-10-CM

## 2018-12-16 DIAGNOSIS — K219 Gastro-esophageal reflux disease without esophagitis: ICD-10-CM

## 2018-12-16 DIAGNOSIS — G4733 Obstructive sleep apnea (adult) (pediatric): ICD-10-CM

## 2018-12-16 DIAGNOSIS — M549 Dorsalgia, unspecified: ICD-10-CM

## 2018-12-16 DIAGNOSIS — N3010 Interstitial cystitis (chronic) without hematuria: ICD-10-CM

## 2018-12-16 DIAGNOSIS — K859 Acute pancreatitis without necrosis or infection, unspecified: ICD-10-CM

## 2018-12-16 DIAGNOSIS — R42 Dizziness and giddiness: ICD-10-CM

## 2018-12-16 DIAGNOSIS — I10 Essential (primary) hypertension: ICD-10-CM

## 2018-12-16 DIAGNOSIS — E7800 Pure hypercholesterolemia, unspecified: ICD-10-CM

## 2018-12-16 DIAGNOSIS — G43909 Migraine, unspecified, not intractable, without status migrainosus: ICD-10-CM

## 2018-12-16 DIAGNOSIS — R51 Headache: ICD-10-CM

## 2018-12-16 DIAGNOSIS — J302 Other seasonal allergic rhinitis: ICD-10-CM

## 2018-12-16 DIAGNOSIS — F99 Mental disorder, not otherwise specified: ICD-10-CM

## 2018-12-16 DIAGNOSIS — M1990 Unspecified osteoarthritis, unspecified site: ICD-10-CM

## 2018-12-16 DIAGNOSIS — Z720 Tobacco use: ICD-10-CM

## 2018-12-16 MED ORDER — VERAPAMIL 40 MG PO TAB
1 | ORAL_TABLET | Freq: Two times a day (BID) | ORAL | 3 refills | 90.00000 days | Status: DC
Start: 2018-12-16 — End: 2019-05-30

## 2018-12-16 MED ORDER — NEBIVOLOL 5 MG PO TAB
5 mg | ORAL_TABLET | Freq: Every day | ORAL | 3 refills | 60.00000 days | Status: DC
Start: 2018-12-16 — End: 2020-02-02

## 2019-01-26 ENCOUNTER — Encounter: Admit: 2019-01-26 | Discharge: 2019-01-26 | Payer: Commercial Managed Care - HMO

## 2019-01-27 ENCOUNTER — Encounter: Admit: 2019-01-27 | Discharge: 2019-01-27 | Payer: Commercial Managed Care - HMO

## 2019-01-27 ENCOUNTER — Ambulatory Visit: Admit: 2019-01-27 | Discharge: 2019-01-27 | Payer: Commercial Managed Care - HMO

## 2019-01-27 DIAGNOSIS — Z9884 Bariatric surgery status: Secondary | ICD-10-CM

## 2019-01-27 DIAGNOSIS — M1990 Unspecified osteoarthritis, unspecified site: ICD-10-CM

## 2019-01-27 DIAGNOSIS — N3010 Interstitial cystitis (chronic) without hematuria: ICD-10-CM

## 2019-01-27 DIAGNOSIS — R079 Chest pain, unspecified: Secondary | ICD-10-CM

## 2019-01-27 DIAGNOSIS — K219 Gastro-esophageal reflux disease without esophagitis: ICD-10-CM

## 2019-01-27 DIAGNOSIS — R51 Headache: ICD-10-CM

## 2019-01-27 DIAGNOSIS — G4733 Obstructive sleep apnea (adult) (pediatric): ICD-10-CM

## 2019-01-27 DIAGNOSIS — R1013 Epigastric pain: Secondary | ICD-10-CM

## 2019-01-27 DIAGNOSIS — K859 Acute pancreatitis without necrosis or infection, unspecified: ICD-10-CM

## 2019-01-27 DIAGNOSIS — F99 Mental disorder, not otherwise specified: ICD-10-CM

## 2019-01-27 DIAGNOSIS — Z720 Tobacco use: ICD-10-CM

## 2019-01-27 DIAGNOSIS — I10 Essential (primary) hypertension: Secondary | ICD-10-CM

## 2019-01-27 DIAGNOSIS — T753XXA Motion sickness, initial encounter: ICD-10-CM

## 2019-01-27 DIAGNOSIS — R42 Dizziness and giddiness: ICD-10-CM

## 2019-01-27 DIAGNOSIS — R0600 Dyspnea, unspecified: ICD-10-CM

## 2019-01-27 DIAGNOSIS — J302 Other seasonal allergic rhinitis: ICD-10-CM

## 2019-01-27 DIAGNOSIS — E7800 Pure hypercholesterolemia, unspecified: ICD-10-CM

## 2019-01-27 DIAGNOSIS — G43909 Migraine, unspecified, not intractable, without status migrainosus: ICD-10-CM

## 2019-01-27 DIAGNOSIS — M549 Dorsalgia, unspecified: ICD-10-CM

## 2019-01-27 MED ORDER — LIDOCAINE HCL 2 % MM JELL
0 refills | Status: DC
Start: 2019-01-27 — End: 2019-01-27
  Administered 2019-01-27: 16:00:00 3 mL via TOPICAL

## 2019-01-31 ENCOUNTER — Encounter: Admit: 2019-01-31 | Discharge: 2019-01-31

## 2019-01-31 DIAGNOSIS — R51 Headache: Secondary | ICD-10-CM

## 2019-01-31 DIAGNOSIS — F99 Mental disorder, not otherwise specified: Secondary | ICD-10-CM

## 2019-01-31 DIAGNOSIS — Z720 Tobacco use: Secondary | ICD-10-CM

## 2019-01-31 DIAGNOSIS — J302 Other seasonal allergic rhinitis: Secondary | ICD-10-CM

## 2019-01-31 DIAGNOSIS — G43909 Migraine, unspecified, not intractable, without status migrainosus: Secondary | ICD-10-CM

## 2019-01-31 DIAGNOSIS — M1990 Unspecified osteoarthritis, unspecified site: Secondary | ICD-10-CM

## 2019-01-31 DIAGNOSIS — N3010 Interstitial cystitis (chronic) without hematuria: Secondary | ICD-10-CM

## 2019-01-31 DIAGNOSIS — R0600 Dyspnea, unspecified: Secondary | ICD-10-CM

## 2019-01-31 DIAGNOSIS — M549 Dorsalgia, unspecified: Secondary | ICD-10-CM

## 2019-01-31 DIAGNOSIS — I10 Essential (primary) hypertension: Principal | ICD-10-CM

## 2019-01-31 DIAGNOSIS — R42 Dizziness and giddiness: Secondary | ICD-10-CM

## 2019-01-31 DIAGNOSIS — K859 Acute pancreatitis without necrosis or infection, unspecified: Secondary | ICD-10-CM

## 2019-01-31 DIAGNOSIS — G4733 Obstructive sleep apnea (adult) (pediatric): Secondary | ICD-10-CM

## 2019-01-31 DIAGNOSIS — K219 Gastro-esophageal reflux disease without esophagitis: Secondary | ICD-10-CM

## 2019-01-31 DIAGNOSIS — T753XXA Motion sickness, initial encounter: Secondary | ICD-10-CM

## 2019-01-31 DIAGNOSIS — E7800 Pure hypercholesterolemia, unspecified: Secondary | ICD-10-CM

## 2019-02-08 ENCOUNTER — Encounter: Admit: 2019-02-08 | Discharge: 2019-02-08

## 2019-02-08 NOTE — Telephone Encounter
-----   Message from Varney Baas, APRN-NP sent at 02/08/2019  8:42 AM CDT -----  Regarding: Call pt and set up [place] or in person OV  As requested  by Dr. Candi Leash, please call pt and have pt follow up with me via Telehealth or in person to discuss results below and the plan moving forward.     Thanks!  ----- Message -----  From: Threasa Alpha, MD  Sent: 02/07/2019   4:38 PM CDT  To: Varney Baas, APRN-NP    Lora,    HRM is normal  PH impedance shows no acid reflux (off PPI)    Please follow up in clinic. She doesn't have GERD. Probably has non cardiac chest pain. Should try Impiramine or Nortriptyline

## 2019-02-08 NOTE — Telephone Encounter
Pt called and scheduled for a OV on 6/11. No further questions at this time.

## 2019-02-09 ENCOUNTER — Encounter: Admit: 2019-02-09 | Discharge: 2019-02-09

## 2019-02-09 DIAGNOSIS — F99 Mental disorder, not otherwise specified: Secondary | ICD-10-CM

## 2019-02-09 DIAGNOSIS — R0600 Dyspnea, unspecified: Secondary | ICD-10-CM

## 2019-02-09 DIAGNOSIS — I10 Essential (primary) hypertension: Principal | ICD-10-CM

## 2019-02-09 DIAGNOSIS — M549 Dorsalgia, unspecified: Secondary | ICD-10-CM

## 2019-02-09 DIAGNOSIS — K859 Acute pancreatitis without necrosis or infection, unspecified: Secondary | ICD-10-CM

## 2019-02-09 DIAGNOSIS — R42 Dizziness and giddiness: Secondary | ICD-10-CM

## 2019-02-09 DIAGNOSIS — T753XXA Motion sickness, initial encounter: Secondary | ICD-10-CM

## 2019-02-09 DIAGNOSIS — Z720 Tobacco use: Secondary | ICD-10-CM

## 2019-02-09 DIAGNOSIS — E7800 Pure hypercholesterolemia, unspecified: Secondary | ICD-10-CM

## 2019-02-09 DIAGNOSIS — K219 Gastro-esophageal reflux disease without esophagitis: Secondary | ICD-10-CM

## 2019-02-09 DIAGNOSIS — R51 Headache: Secondary | ICD-10-CM

## 2019-02-09 DIAGNOSIS — M1990 Unspecified osteoarthritis, unspecified site: Secondary | ICD-10-CM

## 2019-02-09 DIAGNOSIS — J302 Other seasonal allergic rhinitis: Secondary | ICD-10-CM

## 2019-02-09 DIAGNOSIS — G43909 Migraine, unspecified, not intractable, without status migrainosus: Secondary | ICD-10-CM

## 2019-02-09 DIAGNOSIS — N3010 Interstitial cystitis (chronic) without hematuria: Secondary | ICD-10-CM

## 2019-02-09 DIAGNOSIS — G4733 Obstructive sleep apnea (adult) (pediatric): Secondary | ICD-10-CM

## 2019-02-10 ENCOUNTER — Encounter: Admit: 2019-02-10 | Discharge: 2019-02-10

## 2019-02-10 ENCOUNTER — Ambulatory Visit: Admit: 2019-02-10 | Discharge: 2019-02-11

## 2019-02-10 DIAGNOSIS — J302 Other seasonal allergic rhinitis: Secondary | ICD-10-CM

## 2019-02-10 DIAGNOSIS — Z720 Tobacco use: Secondary | ICD-10-CM

## 2019-02-10 DIAGNOSIS — G4733 Obstructive sleep apnea (adult) (pediatric): Secondary | ICD-10-CM

## 2019-02-10 DIAGNOSIS — M549 Dorsalgia, unspecified: Secondary | ICD-10-CM

## 2019-02-10 DIAGNOSIS — M1990 Unspecified osteoarthritis, unspecified site: Secondary | ICD-10-CM

## 2019-02-10 DIAGNOSIS — T753XXA Motion sickness, initial encounter: Secondary | ICD-10-CM

## 2019-02-10 DIAGNOSIS — R42 Dizziness and giddiness: Secondary | ICD-10-CM

## 2019-02-10 DIAGNOSIS — R0789 Other chest pain: Secondary | ICD-10-CM

## 2019-02-10 DIAGNOSIS — K219 Gastro-esophageal reflux disease without esophagitis: Secondary | ICD-10-CM

## 2019-02-10 DIAGNOSIS — I10 Essential (primary) hypertension: Principal | ICD-10-CM

## 2019-02-10 DIAGNOSIS — R51 Headache: Secondary | ICD-10-CM

## 2019-02-10 DIAGNOSIS — R9389 Abnormal findings on diagnostic imaging of other specified body structures: Secondary | ICD-10-CM

## 2019-02-10 DIAGNOSIS — R0600 Dyspnea, unspecified: Secondary | ICD-10-CM

## 2019-02-10 DIAGNOSIS — F5101 Primary insomnia: Secondary | ICD-10-CM

## 2019-02-10 DIAGNOSIS — G43909 Migraine, unspecified, not intractable, without status migrainosus: Secondary | ICD-10-CM

## 2019-02-10 DIAGNOSIS — K859 Acute pancreatitis without necrosis or infection, unspecified: Secondary | ICD-10-CM

## 2019-02-10 DIAGNOSIS — E7800 Pure hypercholesterolemia, unspecified: Secondary | ICD-10-CM

## 2019-02-10 DIAGNOSIS — F99 Mental disorder, not otherwise specified: Secondary | ICD-10-CM

## 2019-02-10 DIAGNOSIS — K5909 Other constipation: Secondary | ICD-10-CM

## 2019-02-10 DIAGNOSIS — N3010 Interstitial cystitis (chronic) without hematuria: Secondary | ICD-10-CM

## 2019-02-10 DIAGNOSIS — E669 Obesity, unspecified: Secondary | ICD-10-CM

## 2019-02-10 DIAGNOSIS — Z8371 Family history of colonic polyps: Secondary | ICD-10-CM

## 2019-02-10 MED ORDER — HYDROXYZINE HCL 10 MG PO TAB
ORAL_TABLET | Freq: Every evening | ORAL | 0 refills | 30.00000 days | Status: DC | PRN
Start: 2019-02-10 — End: 2019-05-05

## 2019-02-10 NOTE — Telephone Encounter
Refill request for Atarax. Per protocol refill approved and sent to [facility]

## 2019-02-10 NOTE — Progress Notes
Date of Service: 02/10/2019    Subjective:             Christina Garrison is a 48 y.o. female.    History of Present Illness    In person visit    Using oxygen 3 liters at nighttime.  She has gained weight, maybe due to Depakote which is being weaned down and being replaced with lamictal.  Depakote improved her anger but not her overall mood.  She is supposed to start Lamictal tonight.    Clonazepam is taken 9 am and 3 pm for anxiety.      She takes Atarax 10 mg at 9 pm.  Then goes into bed and watches Netflix until about 1 am, turns off most lights (she likes to have a little bit of light) and goes to sleep.  She wakes up feeling unrested around 8:30 am.  She has her dogs in her bed.    She admits to a lot of continued stress at home.         Review of Systems   Constitutional: Positive for fatigue.   Eyes: Positive for photophobia.   Neurological: Positive for dizziness and headaches.   Psychiatric/Behavioral: The patient is nervous/anxious.    All other systems reviewed and are negative.        Objective:         ??? biotin 5,000 mcg TbDi Dissolve 2 tablets by mouth twice daily.   ??? buPROPion (WELLBUTRIN) 75 mg tablet Take 75 mg by mouth daily before lunch.   ??? clonazePAM (KLONOPIN) 0.5 mg tablet Take 1.5 tablets by mouth twice daily.   ??? clonazePAM (KLONOPIN) 1 mg tablet Take one tablet by mouth as Needed (For driving).   ??? cyanocobalamin (vitamin B-12) 2,500 mcg tab Take 1 tablet by mouth daily with breakfast.   ??? diphenhydrAMINE (BENADRYL ALLERGY) 12.5 mg/5 mL oral solution Take 12.5 mg by mouth every 6 hours as needed.   ??? divalproex (DEPAKOTE EC) 500 mg DR tablet Take 500 mg by mouth twice daily. Take with food.   ??? hydrOXYzine (ATARAX) 10 mg tablet TAKE 1-3 TABLETS BY MOUTH AT BEDTIME AS NEEDED FOR SLEEP   ??? lamoTRIgine (LAMICTAL) 25 mg tablet   See Instructions, 60 Tab, take 1 pill every other night for 2 weeks, then 1 pill nightly for 2 weeks, then 2 pills nightly for 2 weeks, 0 Number of Refills, 0, Instructions Replace Required Details Route to [facility]acy Electronically[phone],...   ??? multivit, Ca, min-FA-soy isofl 400-60 mcg-mg tab Take 1 tablet by mouth twice daily.   ??? nebivoloL (BYSTOLIC) 5 mg tablet Take one tablet by mouth daily.   ??? pantoprazole DR (PROTONIX) 40 mg tablet Take one tablet by mouth daily.   ??? rosuvastatin (CRESTOR) 20 mg tablet TAKE 1 TAB BY MOUTH DAILY.   ??? scopolamine (TRANSDERM-SCOP) 1.5 mg 3 day patch Apply one patch to top of skin as directed every 72 hours.   ??? Verapamil 40 mg tab Take one tablet by mouth twice daily.   ??? vitamins, multi w/minerals 9 mg iron-400 mcg tab Take 1 tablet by mouth daily with breakfast.     Vitals:    02/10/19 1017   BP: 131/79   Pulse: 70   Weight: 107 kg (236 lb)   Height: 165.1 cm (65)   PainSc: Zero     Body mass index is 39.27 kg/m???.     Physical Exam  Alert.  Fluent.  No acute distress.  Walking/gait normal.         Assessment and Plan:  *The patient's Epworth Sleepiness Scale Score is 3/24.  STOPBANG Score: 3  If score > 3 there is a high probability that they have OSA.  Depression Screening was performed on Christina Garrison in clinic today. Based on the score of 4, I provided support in the office today via active listening and general counseling.  Discussed patient's BMI with her.  The body mass index is 39.27 kg/m???. and falls within the category of Obesity 2 (35 to <40); counseled regarding weight loss.    Problem   Primary Insomnia   Obstructive Sleep Apnea Syndrome    PSG on 03/16/15: AHI 8.9 events per hour,The REM AHI was 21.3, supine REM AHI 37.??? The overall supine AHI was 16 and lateral AHI was 0. Oxyhemoglobin saturation nadir of 84% and Time below 88% was 12.8 minutes.   PLMS 0/hr   12/19/2015 oximetry on 2 Liters, <88% 6 minutes.  Increase to 3 liters and recheck.  Much better headaches.     Obesity (Bmi 30-39.9)    Body mass index is 30.71 kg/(m^2).          Primary insomnia Sleep hygeine not the best.  Had a visit with Dr. Lorna Dibble last year but patient appears to have not followed up.  She watches Netflix in bed at nighttime, and this is her escape from problems.  She lives in the house with her husband but they do not have the best interactions right now.      We discussed that chronic pain both physical and psychological can result in feeling unrested.  However, she has also gained weight which could worsen her underlying OSA and she is intolerant of CPAP.      She is taking hydroxyzine 10 mg HS and I recommended she could increase by 10 mg every 3-5 nights and to touch base in a week or two if it is not helping or if she gets SE like AM grogginess.  Discussed SE could worsen with higher doses.    We discussed the interaction pain and stress have on sleep and that her sleep might not improve until either those stresses improve or can be processed.    Obstructive sleep apnea syndrome  She is on 3 liters of supplemental oxygen at night which improves the quality of her sleep.  She has tried and failed CPAP.  I would recommend an overnight oximetry on 3 liters to ensure her oxygen needs haven't changed.      Obesity (BMI 30-39.9)  Discussed patient's BMI with her.  The body mass index is 39.27 kg/m???. and falls within the category of Obesity 2 (35 to <40); BMI plan started.  She is weaning off of Depakote onto lamotrigine and she d/c mirtazapine.

## 2019-02-10 NOTE — Progress Notes
Date of Service: 02/10/2019    Subjective:             Christina Garrison is a 48 y.o. female.    History of Present Illness    Christina Garrison is a very pleasant 48 year old Caucasian female who was evaluated by Dr. Keenan Bachelor on 08/04/2018 for abdominal pain.  History of gastric sleeve surgery in 2015.  After surgery, she lost about 150 pounds, regained 50, but was able to maintain 100 pounds below baseline weight.  She reported epigastric and retrosternal chest pain for 6 months, stabbing/radiating into the back, and intermittent.  Could last several hours or up to a day.  More recently could last up to 2 to 3 days.  Symptoms seemed worse especially with eating meats, spicy foods, and cheese.  Some improvement with Tums or Pepcid.  Prior lab work was unremarkable including normal amylase and lipase.  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  History of cholecystectomy.  Taking on mirtazapine, Wellbutrin, Klonopin, and Depakote.  In addition to the pain she reported issues with constipation described as 3-4 bowel movements a week.  Abdominal pain did not improve after bowel movements.  No red flag symptoms including dysphagia, hematemesis, or melena.  Lab work on 08/04/2018 revealed normal CBC, CMP, and TSH.  EGD on 08/23/2018 was unremarkable except for postop changes consistent with gastric sleeve surgery.  Biopsies were negative for eosinophilic esophagitis and H. pylori.  Colonoscopy on 08/23/2018 revealed fair prep and abdominal counterpressure had to be applied due to looping.  Small and flat lesions could have been missed.  There was some scarring mention in the distal rectum suggestive of prior hemorrhoid surgery.  Exam otherwise unremarkable.  Repeat colonoscopy recommended in 5 years. CT chest was not performed as patient did not believe it was approved by her insurance.  Since then, CT chest has been approved, not yet scheduled.  CT A/P with IV contrast on 08/31/2018 was unremarkable for any acute findings.  It did mention a moderate sized fat-containing supra umbilical hernia.  She was referred to Dr. Alley/surgery for evaluation and underwent open hernia repair with mesh on 09/23/2018. I saw her for a follow up OV 09/29/2018 when she continued to struggle with retrosternal burning type pain which present when waking up daily and could increase with ingestion of solids and liquids as well as spicy foods. No typical heartburn symptoms. Only a few hours of relief after pantoprazole increased to bid. No dysphagia. I recommended 24 hour pH with impedence OFF PPI and esophageal manometry. CT chest was also recommended.     I am seeing her today for a follow-up visit.  We discussed test results in detail.  CT chest on 10/07/2018 did not show any sinister findings to explain her atypical chest pain.  However, there was mention of 2 tiny nodules in the left lower lobe which need a one-year follow-up.  24-hour pH with impedance study completed on 01/27/2019 off PPI therapy revealed no increased acid reflux off PPI therapy.  It did show some evidence for nonacid reflux although there was no correlation with her symptoms.  Esophageal manometry on 01/27/2019 was normal.  Dr. Keenan Bachelor did review the results.  He recommended a trial of imipramine or nortriptyline for atypical chest pain.  Currently, she is back on pantoprazole 40 mg once a day and would like to consider switching to Pepcid as this seems to help with symptoms a bit better.  No dysphagia symptoms.  No issues with nausea, vomiting, anorexia, or weight loss.  No epigastric pain or other abdominal pain.  Currently, not needing MiraLAX and is having regular bowel movements without constipation/hard stools or diarrhea.  No rectal bleeding or melena.  She is on multiple medications through a psychiatrist in [place] who she sees for depression.  She is also on Depakote per neurology and plans to switch to Lamictal.  ???  Social history: Married with no children.  Quit smoking cigarettes 8 years ago, switched to vaping.  No alcohol.  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  ???  Family history: Significant cancer in her family history.  Sister had a brain tumor.  Another sister had lung cancer and colon polyps.  Brother with liver cancer.  Another brother with lung cancer.  Other had breast cancer.  Father had prostate cancer.               Review of Systems   Constitutional: Negative for appetite change, chills, diaphoresis, fatigue, fever and unexpected weight change.   HENT: Positive for sinus pressure. Negative for mouth sores, sore throat, trouble swallowing and voice change.    Eyes: Positive for itching (allergies ). Negative for pain and visual disturbance.   Respiratory: Negative for choking and chest tightness.    Cardiovascular: Positive for chest pain.   Gastrointestinal: Negative for abdominal distention, abdominal pain, anal bleeding, blood in stool, constipation, diarrhea, nausea, rectal pain and vomiting.   Genitourinary: Negative for flank pain and pelvic pain.   Musculoskeletal: Negative for arthralgias and back pain.   Skin: Negative for rash.   Neurological: Positive for headaches. Negative for light-headedness.   Hematological: Negative for adenopathy.   Psychiatric/Behavioral: The patient is nervous/anxious.    All other systems reviewed and are negative.        Objective:         ??? biotin 5,000 mcg TbDi Dissolve 2 tablets by mouth twice daily.   ??? buPROPion (WELLBUTRIN) 75 mg tablet Take 75 mg by mouth daily before lunch.   ??? clonazePAM (KLONOPIN) 0.5 mg tablet Take 1.5 tablets by mouth twice daily.   ??? clonazePAM (KLONOPIN) 1 mg tablet Take one tablet by mouth as Needed (For driving).   ??? cyanocobalamin (vitamin B-12) 2,500 mcg tab Take 1 tablet by mouth daily with breakfast.   ??? diphenhydrAMINE (BENADRYL ALLERGY) 12.5 mg/5 mL oral solution Take 12.5 mg by mouth every 6 hours as needed.   ??? divalproex (DEPAKOTE EC) 500 mg DR tablet Take 500 mg by mouth twice daily. Take with food.   ??? hydrOXYzine (ATARAX) 10 mg tablet TAKE 1-3 TABLETS BY MOUTH AT BEDTIME AS NEEDED FOR SLEEP   ??? lamoTRIgine (LAMICTAL) 25 mg tablet   See Instructions, 60 Tab, take 1 pill every other night for 2 weeks, then 1 pill nightly for 2 weeks, then 2 pills nightly for 2 weeks, 0 Number of Refills, 0, Instructions Replace Required Details Route to Pharmacy Electronically, [phone],...   ??? mirtazapine (REMERON) 15 mg tablet Take 15 mg by mouth at bedtime daily.   ??? multivit, Ca, min-FA-soy isofl 400-60 mcg-mg tab Take 1 tablet by mouth twice daily.   ??? nebivoloL (BYSTOLIC) 5 mg tablet Take one tablet by mouth daily.   ??? pantoprazole DR (PROTONIX) 40 mg tablet Take one tablet by mouth daily.   ??? rosuvastatin (CRESTOR) 20 mg tablet TAKE 1 TAB BY MOUTH DAILY.   ??? scopolamine (TRANSDERM-SCOP) 1.5 mg 3 day patch Apply one patch to top  of skin as directed every 72 hours.   ??? topiramate (TOPAMAX) 25 mg tablet Take one tablet by mouth every 12 hours.   ??? Verapamil 40 mg tab Take one tablet by mouth twice daily.   ??? vitamins, multi w/minerals 9 mg iron-400 mcg tab Take 1 tablet by mouth daily with breakfast.     Vitals:    02/10/19 1503   BP: 131/75   BP Source: Arm, Left Upper   Patient Position: Sitting   Pulse: 74   Temp: 36.7 ???C (98.1 ???F)   TempSrc: Oral   Weight: 106.7 kg (235 lb 4.8 oz)   Height: 165.1 cm (65)   PainSc: Five     Body mass index is 39.16 kg/m???.     CT Chest    Clinical Indication: Chest pain, unspecified type. History of gastric   sleeve, nausea.    Technique: Multiple contiguous axial CT images were obtained through the   chest with IV contrast. ???Post processing coronal and sagittal   reconstruction images were made from the axial images.    Comparison: Partial comparison is made to images of the lower chest and upper abdomen obtained during CT abdomen dated October 07, 2018. No   comparison chest CT is available.    Findings:        Axilla, Mediastinum and Hila: No lymphadenopathy. There are no mediastinal   fluid collections.    Heart and Great Vessels: The heart size is normal. There is no pericardial   effusion.    Lungs and Pleura: Mild scarring and atelectasis is seen in the lower   lungs. There is a tiny nodule in the left lower lobe on series 5, image 34   which is stable since a CT abdomen of May 27, 2007 compatible with a   scar or granuloma. There are 2 tiny nodules along the major fissure   measuring up to 0.3 cm as seen on series 5, image 24 and 25. There is mild   emphysema. ??? No pleural effusions.    Chest Wall and Osseous Structures: No destructive osseous lesions.     Visualized Upper Abdomen: Clips are seen in the gallbladder fossa. Suture   material is seen about the stomach.    IMPRESSION      1. ???No mediastinal mass, lymphadenopathy or fluid collection. Changes of   gastric sleeve are noted.    2. ???Mild emphysema.    3. ???There are 2 tiny nodules along the left major fissure which likely   represent granulomas or intrapulmonary lymph nodes. If there are   significant risk factors for pulmonary malignancy, a follow-up chest CT in   12 months could be obtained to assess for stability.          Physical Exam  Vitals signs reviewed.   Constitutional:       General: She is not in acute distress.     Appearance: She is well-developed. She is not diaphoretic.   HENT:      Head: Normocephalic and atraumatic.   Eyes:      General: No scleral icterus.        Right eye: No discharge.         Left eye: No discharge.      Conjunctiva/sclera: Conjunctivae normal.      Pupils: Pupils are equal, round, and reactive to light.   Neck:      Musculoskeletal: Normal range of motion and neck supple.      Thyroid: No  thyromegaly.   Cardiovascular:      Rate and Rhythm: Normal rate and regular rhythm. Heart sounds: Normal heart sounds.   Pulmonary:      Effort: Pulmonary effort is normal. No respiratory distress.   Abdominal:      General: There is no distension.      Palpations: Abdomen is soft. There is no mass.      Tenderness: There is no abdominal tenderness. There is no guarding or rebound.   Musculoskeletal: Normal range of motion.   Lymphadenopathy:      Cervical: No cervical adenopathy.   Skin:     General: Skin is warm and dry.      Findings: No rash.   Neurological:      Mental Status: She is alert and oriented to person, place, and time.      Coordination: Coordination normal.   Psychiatric:         Behavior: Behavior normal.         Thought Content: Thought content normal.         Judgment: Judgment normal.              Assessment and Plan:    1.  Chest pain, atypical.  Patient reports over 72-month history of retrosternal chest pain described as burning which can occur when waking up but also can increase with intake of solids and liquids.  Also reports sore throat.  Certain triggers include spicy foods.  No improvement with Tums and Pepcid in the past as needed. Only a few hours of relief with pantoprazole 40 mg qd (bid PPI not approved by her insurance). No dysphagia. History of minimal typical GERD related symptoms although she does report occasional regurgitation.    Extensive GI work-up has been completed and reveals atypical chest pain likely functional in nature. EGD 08/23/2018 with normal-appearing esophagus.  Esophageal biopsies normal, no evidence of eosinophilic esophagitis. CT chest on 10/07/2018 did not show any sinister findings to explain her atypical chest pain.  However, there was mention of 2 tiny nodules in the left lower lobe which need a one-year follow-up.  24-hour pH with impedance study completed on 01/27/2019 off PPI therapy revealed no increased acid reflux off PPI therapy.  It did show some evidence for nonacid reflux although there was no correlation with her symptoms. Esophageal manometry on 01/27/2019 was normal.      2.  Constipation, currently stable. No taking Miralax regularly.   ???  3.  Supraumbilical hernia, per CT A/P. Status post open repair with mesh on 09/23/2018 with Dr. Joan Flores.   ???  4.  Family history of multiple colon polyps, sister, has reportedly had resection due to polyps.  Patient had colonoscopy 08/24/2018, fair prep, no obvious polyps but flat or small polyps could have been missed.    5. Two tiny LLL pulmonary nodules, found incidentally on CT chest 10/07/2018. Will need 1 year follow up.    ???  Plan:   ???  1.   For her atypical chest pain, Dr. Keenan Bachelor recommended a trial of imipramine or nortriptyline.  However, after consulting with my clinic pharmacist, there can be significant interactions with either medication with her current medications, especially bupropion.  Therefore, I will not prescribe any medications for now.  I will do additional investigation to determine if either imipramine or nortriptyline can be considered.  In the meantime, I encouraged her to discuss imipramine or nortriptyline use with her psychiatrist to see if he has any insight/recommendations.  She will call me in 1 week to further discuss.  2.  She would like to get off pantoprazole.  She will taper off pantoprazole and transition to Pepcid 1-2 times a day.  She is to call if any issues.  3.  Encouraged use of MiraLAX daily if needed for constipation.  4.  Repeat colonoscopy in 5 years, 08/2023.  5.  She requested referral to get established with a PCP here at [facility].  Referral for [facility] placed today.  She does need a repeat CT scan of the chest in 10/2019.  I encouraged her to bring this to the attention of her PCP when she gets established.  She agreed.  6.  For now, she will follow-up in the GI clinic as needed.  She was encouraged to call at anytime with any questions or concerns.    Patient was advised of the plan and voiced agreement and understanding. Total face to face time spent with patient: 30 minutes with >50% of that time spent counseling the patient on medications, prior study results related to symptoms, differential diagnosis, and options regarding the plan of care.      Thank you for allowing me to see your patient in the office today. Please feel free to contact me if you have any questions or concerns.      This note was in part completed with Dragon, a voice to text dictation system. Some errors may have occured and persist despite my best efforts to edit this document to eliminate dictation/translation???related errors.??? If you have questions/concerns, please contact me for clarification

## 2019-02-11 ENCOUNTER — Encounter: Admit: 2019-02-11 | Discharge: 2019-02-11

## 2019-02-11 DIAGNOSIS — R0902 Hypoxemia: Secondary | ICD-10-CM

## 2019-02-11 DIAGNOSIS — G4733 Obstructive sleep apnea (adult) (pediatric): Secondary | ICD-10-CM

## 2019-02-11 DIAGNOSIS — Z7689 Persons encountering health services in other specified circumstances: Principal | ICD-10-CM

## 2019-02-11 DIAGNOSIS — I10 Essential (primary) hypertension: Secondary | ICD-10-CM

## 2019-02-11 NOTE — Assessment & Plan Note
She is on 3 liters of supplemental oxygen at night which improves the quality of her sleep.  She has tried and failed CPAP.  I would recommend an overnight oximetry on 3 liters to ensure her oxygen needs haven't changed.

## 2019-02-11 NOTE — Assessment & Plan Note
Discussed patient's BMI with her.  The body mass index is 39.27 kg/m. and falls within the category of Obesity 2 (35 to <40); BMI plan started.  She is weaning off of Depakote onto lamotrigine and she d/c mirtazapine.

## 2019-04-04 ENCOUNTER — Encounter: Admit: 2019-04-04 | Discharge: 2019-04-04

## 2019-04-04 DIAGNOSIS — R51 Headache: Secondary | ICD-10-CM

## 2019-04-04 DIAGNOSIS — G44209 Tension-type headache, unspecified, not intractable: Secondary | ICD-10-CM

## 2019-04-04 DIAGNOSIS — R42 Dizziness and giddiness: Secondary | ICD-10-CM

## 2019-04-04 DIAGNOSIS — H8193 Unspecified disorder of vestibular function, bilateral: Secondary | ICD-10-CM

## 2019-04-04 NOTE — Telephone Encounter
This sounds like occipital neuralgia. I know she has had occipital nerve blocks in the past but we should she if she can tolerate another trial of this because this would be very helpful to know if her pain responds to this.  Other considerations include: Referral to PT for myofascial release, CGRP-antagonist, botox, increasing verapamil. Please ask if she is willing to try occipital nerve block and we will go from there. Please also ask if she would be willing to undergo vestibular therapy for her dizziness. Thanks.     Samantha Crimes, MD  Neurology Resident, PGY4  Co-Chief Resident

## 2019-04-04 NOTE — Progress Notes
Patient called with intractable pain radiating from the back of the head forward with associated dizziness. Discussed occipital nerve blocks with patient will to try again for pain control. Will also refer to vestibular therapy. Patient was agreeable.

## 2019-04-04 NOTE — Telephone Encounter
Patient is a former patient of Dr. Manchanda???s and she is calling with complaints of an increase in dizziness and headaches.  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  She states that when she had that she did not have the increased dizziness or migraine type headaches.  She states she feels like they are coming from her neck, states they feel like they start in the neck and then radiates all the way to the front of her head.  She now has these daily and is not really getting any relief.  She is having sensitivity to light, aura???s and nausea.  Some times if she gets to fast she will have increased dizziness but she spoke with her Cardiologists and they don???t feel it is her medication as her blood pressure is under good control averaging 110/120 / 70???s.  She is currently on Verapamil 40 mg BID and then Bystolic 5 mg daily.  She was on Depakote but since has been weaned off and now she is on Lamictal 100 mg BID.  Dr. Tonia Ghent tried Topamax but she had reactions to that making her feel like she was going to pass out.  She also takes 50 mg of Atarax at night which doesn???t really help her sleep.   She has a history of gastric bypass surgery and does report taking bariatric vitamins three times a day.      Regarding the dizziness she was prescribed Scopolamine patches but she felt like they were not really helping much anymore and she felt like that they were drying her out.  She hasn???t been taking anything to treat the headaches as she states she is on so much medication now so she has been trying to ride them out.  She  goes to bed with ice packs every night but her Fit Bit is showing she is only getting 3-5 hours a night of sleep.  Last night she finally took Tylenol and this helped her enough to be able to get a few hours of sleep but then after about 3 hours her headache came back to migraine level.  She did see her Chiropractor in June but she states her Chiropractor doesn???t do any neck manipulation just does some massage therapy on it to try and work it back into place.  She has an appointment the end of Sept to see Dr. Chrisandra Netters and re-establish care.  She is wanting to know if there is anything that someone can recommend to help with daily headaches.      E-mail sent to Dr. Geanie Cooley and Dr. Threasa Heads for further advisement.

## 2019-04-04 NOTE — Telephone Encounter
Spoke with patient and she is willing to try a Occipital Nerve Block again.  She is also willing to do Vestibular therapy but would like to do this in [place] at the hospital there as it is closer to home.  Message sent back to Dr. Hoyle Barr for further direction in plan of care.

## 2019-04-04 NOTE — Telephone Encounter
Referral placed for Vestibular therapy and faxed to [facility] as requested by patient.  Orders placed for Occipital Nerve Block per Dr. Hoyle Barr.        Sounds good, referral for nerve blocks was placed. Thanks.     Samantha Crimes, MD  Neurology Resident, PGY4  Co-Chief Resident

## 2019-04-04 NOTE — Telephone Encounter
Patient called to report she has been dizzy in June and has had a headache through July.  States her BP runs 111-120/70's p 55-100.  States she has an appointment with her neurologist soon.  States she has ear pain and uses ice packs on her head for pain.  Advised patient her BP and pulse are within normal limits and should not be causing these symptoms.  States she also drinks about 60 ounces of water daily.  Advised patient to seek care from her PCP or neurologist.  Patient verbalized understanding.

## 2019-05-04 ENCOUNTER — Encounter: Admit: 2019-05-04 | Discharge: 2019-05-04

## 2019-05-05 ENCOUNTER — Encounter: Admit: 2019-05-05 | Discharge: 2019-05-05

## 2019-05-05 MED ORDER — HYDROXYZINE HCL 10 MG PO TAB
ORAL_TABLET | Freq: Every evening | ORAL | 1 refills | 30.00000 days | Status: DC | PRN
Start: 2019-05-05 — End: 2019-11-24

## 2019-05-05 NOTE — Telephone Encounter
Refill request Atarax. Per protocol refill approved [facility] sent to [facility]

## 2019-05-19 ENCOUNTER — Encounter: Admit: 2019-05-19 | Discharge: 2019-05-19 | Payer: Commercial Managed Care - HMO

## 2019-05-19 NOTE — Telephone Encounter
Phone call from patient advising [facility] will be faxing some documents needed completed for patient oxygen. Also patient states [facility] never received order for overnight oximetry ordered at June visit. Advised patient order has been faxed twice, 02/11/2019, 03/15/2019. Faxed again to [facility] and confirmation of receipt confirmed.

## 2019-05-20 ENCOUNTER — Encounter: Admit: 2019-05-20 | Discharge: 2019-05-20 | Payer: Commercial Managed Care - HMO

## 2019-05-20 ENCOUNTER — Ambulatory Visit: Admit: 2019-05-20 | Discharge: 2019-05-21 | Payer: Commercial Managed Care - HMO

## 2019-05-20 NOTE — Progress Notes
[facility]      Subjective     Follow-Up Visit    HPI: Christina Garrison is a 48 y.o. female who  has a past medical history of Arthritis, Back pain, Dyspnea, Generalized headaches, GERD (gastroesophageal reflux disease), High cholesterol, HTN (hypertension), Hypertension (11/21/2010), Interstitial cystitis, Migraines, Motion sickness, OSA (obstructive sleep apnea), Pancreatitis (11/21/2010), Psychiatric illness, Seasonal allergic reaction, Tobacco abuse, and Vertigo. who presents for evaluation.     She has multiple complaints including migraines and vertigo in the last several months.     She has also been seeing a chiropractor who has been manipulating her neck and as she describes it wallops and pounds on the neck. She has had to sleep with ice packs as a result of the manipulations.     She has had HA's on/off for a while, but noticed a worsening in mid-May.     She describes pain in the posterior occiput/upper neck that radiates up and over the scalp.  She describes it as a helmet HA.     +hair hurts and scalp is very sensitive.     The pain is described as dull, achy, sharp.   It is rated about 7/10.     There is no clear alleviating or exacerbating factor.   However, anxiety does seem to worsen it at times.     No interval inciting injury/event that she can identify. It may have started after the chiropractor she thinks.     She feels her neck is painful and tight.   It extends into the shoulders and upper back.  There is no radicular UE pain.          PRIOR MEDICATIONS:   Effective  Acetaminophen    Ineffective  Gabapentin  Cymbalta    Unable to tolerate  NSAIDs (gastric sleeve)    Never  Lyrica  Ami/Nortriptyline  Tizanidine    PRIOR INTERVENTIONS:  No spine surgery  Effective  Chiropractic    Ineffective  Exercise        Christina Garrison denies any recent fevers, chills, infection, antibiotics, bowel or bladder incontinence, saddle anesthesia, bleeding issues, or recent anticoagulant.     ROS: All 14 systems reviewed and found to be negative except as above and as follows. +cold/heat intolerance, HA, anxiety, sinus pressure, congestion, fatigue, neck stiffness, seasonal allergies.     Past Medical History:  Medical History:   Diagnosis Date   ? Arthritis    ? Back pain    ? Dyspnea     per pt-post op 2015.  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 Generalized headaches    ? GERD (gastroesophageal reflux disease)    ? High cholesterol    ? HTN (hypertension)    ? Hypertension 11/21/2010   ? Interstitial cystitis    ? Migraines    ? Motion sickness    ? OSA (obstructive sleep apnea)     uses NC w CA   ? Pancreatitis 11/21/2010   ? Psychiatric illness     Bipolar; Anxiety; Depression   ? Seasonal allergic reaction    ? Tobacco abuse    ? Vertigo        Family History:  Family History   Problem Relation Age of Onset   ? Coronary Artery Disease Mother    ? Allergy-severe Mother    ? Diabetes Mother    ?  Heart Attack Mother    ? High Cholesterol Mother    ? Hypertension Mother    ? Asthma Mother    ? Coronary Artery Disease Father    ? Heart Attack Father    ? High Cholesterol Father    ? Hypertension Father    ? Hypertension Brother    ? Diabetes Sister    ? High Cholesterol Sister    ? Hypertension Sister    ? Diabetes Brother    ? Tumor Sister    ? Cancer Sister 4        Brain, deceased   ? Tumor Sister 1        2 in Brain   ? Heart Disease Sister 32       Social History:  Lives in [place], [place] (1.25 hours away).   Social History     Socioeconomic History   ? Marital status: Married     Spouse name: Not on file   ? Number of children: Not on file   ? Years of education: Not on file   ? Highest education level: Not on file   Occupational History   ? Not on file   Tobacco Use   ? Smoking status: Former Smoker     Packs/day: 1.50     Years: 30.00     Pack years: 45.00     Types: Cigarettes     Last attempt to quit: 05/02/2012 Years since quitting: 7.0   ? Smokeless tobacco: Never Used   ? Tobacco comment: Currently Vapes   Substance and Sexual Activity   ? Alcohol use: No     Alcohol/week: 0.0 standard drinks   ? Drug use: No   ? Sexual activity: Not Currently     Birth control/protection: Surgical   Other Topics Concern   ? Not on file   Social History Narrative   ? Not on file       Allergies:  Allergies   Allergen Reactions   ? Effexor [Venlafaxine] SEE COMMENTS     Suicidal thoughts   ? Strawberry ANAPHYLAXIS and HIVES   ? Diovan [Valsartan] HIVES   ? Prozac [Fluoxetine] NAUSEA AND VOMITING and ANXIETY   ? Topamax [Topiramate] SEE COMMENTS     Feels like she is going to pass out and speech messed up   ? Decadron [Dexamethasone] NAUSEA AND VOMITING and SEE COMMENTS     Also causes agitation    ? Gabapentin SEE COMMENTS     She can't remember what the reaction was but doesn't want to take it again.       Medications:  Current Outpatient Medications on File Prior to Visit   Medication Sig Dispense Refill   ? biotin 5,000 mcg TbDi Dissolve 2 tablets by mouth twice daily.     ? buPROPion (WELLBUTRIN) 75 mg tablet Take 75 mg by mouth daily before lunch.     ? clonazePAM (KLONOPIN) 0.5 mg tablet Take 1.5 tablets by mouth twice daily. 90 tablet 5   ? clonazePAM (KLONOPIN) 1 mg tablet Take one tablet by mouth as Needed (For driving). 30 tablet 5   ? cyanocobalamin (vitamin B-12) 2,500 mcg tab Take 1 tablet by mouth daily with breakfast.     ? diphenhydrAMINE (BENADRYL ALLERGY) 12.5 mg/5 mL oral solution Take 12.5 mg by mouth every 6 hours as needed.     ? hydrOXYzine (ATARAX) 10 mg tablet TAKE 1-3 TABLETS BY MOUTH AT BEDTIME AS NEEDED FOR SLEEP 270  tablet 1   ? lamoTRIgine (LAMICTAL) 25 mg tablet   See Instructions, 60 Tab, take 1 pill every other night for 2 weeks, then 1 pill nightly for 2 weeks, then 2 pills nightly for 2 weeks, 0 Number of Refills, 0, Instructions Replace Required Details Route to [facility]acy Electronically, [facility] [phone],...     ? multivit, Ca, min-FA-soy isofl 400-60 mcg-mg tab Take 1 tablet by mouth twice daily.     ? nebivoloL (BYSTOLIC) 5 mg tablet Take one tablet by mouth daily. 90 tablet 3   ? pantoprazole DR (PROTONIX) 40 mg tablet Take one tablet by mouth daily. 30 tablet 11   ? rosuvastatin (CRESTOR) 20 mg tablet TAKE 1 TAB BY MOUTH DAILY. 90 tablet 3   ? scopolamine (TRANSDERM-SCOP) 1.5 mg 3 day patch Apply one patch to top of skin as directed every 72 hours. 1 patch 3   ? Verapamil 40 mg tab Take one tablet by mouth twice daily. 90 tablet 3   ? vitamins, multi w/minerals 9 mg iron-400 mcg tab Take 1 tablet by mouth daily with breakfast.       No current facility-administered medications on file prior to visit.        Physical examination:   BP (!) 143/94 (BP Source: Arm, Left Upper, Patient Position: Sitting)  - Pulse 74  - SpO2 97%   Pain Score: Seven    General: The patient is a well-developed, overweight 48 y.o. female in no acute distress.   HEENT: Head is normocephalic and atraumatic. Pupils are equal and reactive to light bilaterally. +posterior occiput tenderness with radiating pain anteriorly over the scalp. Increased sensitivity over the scalp. No pain with palpation over the supraorbital notches.   Cardiac: Based on palpation, pulse appears to be regular rate and rhythm.   Pulmonary: The patient has unlabored respirations and bilateral symmetric chest excursion.   Abdomen: Soft, nontender, and nondistended. There is no rebound or guarding.   Extremities: No clubbing, cyanosis, or edema.     Neurologic:   The patient is alert and oriented times 3.     Musculoskeletal:     +Increased tone and TTP in the bilateral upper trapezius, levator scapulae, and bilateral thoracic paraspinals.     C-Spine   There is mod cervical paraspinal tenderness. Paraspinal muscle tone is increased.  ROM with flexion, extension, rotation, and lateral bending is intact but stiff. Strength is equal and adequate bilaterally in the flexors and extensors of the bilateral upper extremities.   Hoffman's is negative bilaterally.      L SPINE AP & LATERAL    CLINICAL HISTORY: Female, 48 years old. low back pain.     COMPARISON: CT abdomen/pelvis on May 27, 2007    TECHNIQUE: ?L SPINE AP & LATERAL    FINDINGS:    There are 5 nonrib-bearing lumbar-type vertebral bodies. There is normal   alignment and curvature of the lumbar spine. Mild osteophyte formation at   L4-L5 and L5-S1 with minimal disc space narrowing at L4-L5. The SI joints   are open and well corticated. Cholecystectomy clips are noted.    IMPRESSION    1. No acute osseous abnormality of the lumbar spine.  2. Mild degenerative change of the lower lumbar spine.    Last Cr and LFT's:  Creatinine   Date Value Ref Range Status   08/04/2018 0.68 0.4 - 1.00 MG/DL Final     AST (SGOT)   Date Value Ref Range Status   08/04/2018 26  7 - 40 U/L Final     ALT (SGPT)   Date Value Ref Range Status   08/04/2018 34 7 - 56 U/L Final     Alk Phosphatase   Date Value Ref Range Status   08/04/2018 107 25 - 110 U/L Final     Total Bilirubin   Date Value Ref Range Status   08/04/2018 0.4 0.3 - 1.2 MG/DL Final          Assessment:    Christina Garrison is a 48 y.o. female who  has a past medical history of Arthritis, Back pain, Dyspnea, Generalized headaches, GERD (gastroesophageal reflux disease), High cholesterol, HTN (hypertension), Hypertension (11/21/2010), Interstitial cystitis, Migraines, Motion sickness, OSA (obstructive sleep apnea), Pancreatitis (11/21/2010), Psychiatric illness, Seasonal allergic reaction, Tobacco abuse, and Vertigo. who presents for evaluation of multifocal pain.     The pain complaints are most likely due to:    1. Bilateral occipital neuralgia     2. Myalgia, other site     3. Myofascial pain     4. Muscle spasm         Patient has had an adequate trial of > 3 months of rest, exercise, multimodal treatment, and the passage of time without improvement of symptoms. The pain has significant impact on the daily quality of life.     Plan:  1. Plan for Bilateral greater/lesser occipital nerve block and TPI neck/upper back in Clinic.  2. Follow up as needed.     Risks/benefits of all pharmacologic and interventional treatments discussed and questions answered.

## 2019-05-25 ENCOUNTER — Encounter: Admit: 2019-05-25 | Discharge: 2019-05-25 | Payer: Commercial Managed Care - HMO

## 2019-05-27 ENCOUNTER — Encounter: Admit: 2019-05-27 | Discharge: 2019-05-27 | Payer: Commercial Managed Care - HMO

## 2019-05-27 ENCOUNTER — Ambulatory Visit: Admit: 2019-05-27 | Discharge: 2019-05-27 | Payer: Commercial Managed Care - HMO

## 2019-05-27 MED ORDER — CLONAZEPAM 1 MG PO TAB
1 mg | ORAL_TABLET | ORAL | 5 refills | Status: DC | PRN
Start: 2019-05-27 — End: 2019-12-02

## 2019-05-27 MED ORDER — PROCHLORPERAZINE MALEATE 10 MG PO TAB
10 mg | ORAL_TABLET | ORAL | 3 refills | Status: DC | PRN
Start: 2019-05-27 — End: 2019-07-25

## 2019-05-27 MED ORDER — CLONAZEPAM 0.5 MG PO TAB
.75 mg | ORAL_TABLET | Freq: Two times a day (BID) | ORAL | 5 refills | Status: DC
Start: 2019-05-27 — End: 2019-12-02

## 2019-05-27 NOTE — Assessment & Plan Note
Referral for vestibular rehab

## 2019-05-27 NOTE — Assessment & Plan Note
Will obtain MRI C spine with and without contrast to rule out cervical stenosis/disc protrusion - physical exam reassuring  Will obtain CTA head and neck to rule out vertebral artery dissection from neck manipulation

## 2019-05-27 NOTE — Progress Notes
I have personally interviewed and examined Christina Garrison and reviewed the history, examination, impression, and plan of care as outlined by Dr.Shweta Goswami, MD. I personally  participated in patient counseling and coordination of care.  I agree with the assessment and plan as documented by Dr. Shweta Goswami, MD.

## 2019-05-27 NOTE — Progress Notes
Date of Service: 05/27/2019    Subjective:             Christina Garrison is a 48 y.o. female with history of central dizziness, chronic headaches, anxiety, depression who presents for follow up    History of Present Illness    Patient states that her June and July were awful. She was being switched from depakote to lamictal for her anger and found herself to have a headache every day for the month of June and had up to 8 spells a day of dizziness.     Her headache semiology has changed over the years. 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 She had gastric sleeve surgery and following, she was unable to take indomethacin. Her headaches she describes have been usually bifrontal, however, since May, 2020, she has described having headaches that start bi-occipitally and move forward. They last hours to days, range in the 6-7/10 range in pain, and is associated with photophobia, phonophobia and nausea.     She states she eventually was put back on depakote, which had helped reduce the headache and dizziness frequency and intensity. Her psychiatrist also wants to switch her from wellbutrin to cymbalta but she wants to make sure its okay with neurology before she starts this transition.     In the interim before her appointment today, Dr. Geanie Cooley placed an order for occipital nerve block and vestibular rehab. She met with chronic anesthesia and plans to get bilateral occipital nerve block next Friday. She has not started vestibular rehab as she wanted to talk to a provider first before proceeding.     She also describes since Sunday, she's been having bilateral arm numbness/pain that starts in her shoulders and moves down. She's been clenching her fists when she sleeps (this is a chronic thing), and she also feels her arms are heavy. She describes the right arm being worse than the left.     She has been seeing a chiropractor who has been mashing on her neck and she wonders if that may be contributing to her current symptoms.         Review of Systems   HENT: Positive for congestion, sinus pressure and sore throat.    Eyes: Positive for photophobia and itching.   Endocrine: Positive for cold intolerance and heat intolerance.   Musculoskeletal: Positive for back pain, neck pain and neck stiffness.   Allergic/Immunologic: Positive for environmental allergies and food allergies.   Neurological: Positive for dizziness, light-headedness, numbness and headaches.   Psychiatric/Behavioral: Positive for decreased concentration. The patient is nervous/anxious.    All other systems reviewed and are negative.        Objective:         ? biotin 5,000 mcg TbDi Dissolve 2 tablets by mouth twice daily.   ? buPROPion (WELLBUTRIN) 75 mg tablet Take 75 mg by mouth daily before lunch.   ? clonazePAM (KLONOPIN) 0.5 mg tablet Take 1.5 tablets by mouth twice daily.   ? clonazePAM (KLONOPIN) 1 mg tablet Take one tablet by mouth as Needed (For driving).   ? cyanocobalamin (vitamin B-12) 2,500 mcg tab Take 1 tablet by mouth daily with breakfast.   ? diphenhydrAMINE (BENADRYL ALLERGY) 12.5 mg/5 mL oral solution Take 12.5 mg by mouth every 6 hours as needed.   ? divalproex ER (DEPAKOTE ER) 250 mg ER tablet Take 250 mg by mouth daily. Take with food.   ? hydrOXYzine (ATARAX) 10 mg  tablet TAKE 1-3 TABLETS BY MOUTH AT BEDTIME AS NEEDED FOR SLEEP   ? lamoTRIgine (LAMICTAL) 25 mg tablet   See Instructions, 60 Tab, take 1 pill every other night for 2 weeks, then 1 pill nightly for 2 weeks, then 2 pills nightly for 2 weeks, 0 Number of Refills, 0, Instructions Replace Required Details [phone],...   ? multivit, Ca, min-FA-soy isofl 400-60 mcg-mg tab Take 1 tablet by mouth twice daily.   ? nebivoloL (BYSTOLIC) 5 mg tablet Take one tablet by mouth daily.   ? pantoprazole DR (PROTONIX) 40 mg tablet Take one tablet by mouth daily. ? prochlorperazine maleate (COMPAZINE) 10 mg tablet Take one tablet by mouth every 6 hours as needed for Nausea. Take with tylenol and benadryl for acute migraine   ? rosuvastatin (CRESTOR) 20 mg tablet TAKE 1 TAB BY MOUTH DAILY.   ? scopolamine (TRANSDERM-SCOP) 1.5 mg 3 day patch Apply one patch to top of skin as directed every 72 hours.   ? Verapamil 40 mg tab Take one tablet by mouth twice daily.   ? vitamins, multi w/minerals 9 mg iron-400 mcg tab Take 1 tablet by mouth daily with breakfast.     Vitals:    05/27/19 1046   BP: (!) (P) 146/98   BP Source: (P) Arm, Left Upper   Patient Position: (P) Sitting   Pulse: (P) 68   Weight: 101.6 kg (224 lb)   Height: 165.1 cm (65)   PainSc: Five     Body mass index is 37.28 kg/m?Marland Kitchen     General physical exam:    HEENT: normocephalic, eyes open with no discharge, nares patent, oropharynx is clear with no lesions, palate intact  Carotids: No bruits  CV: regular rate and rhythm, no murmur, distal pulses palpable  Chest: normal configuration, lungs are clear bilaterally  Ab: soft, non-tender, no masses, no organomegaly  Skin: no rashes or lesions    Neuro exam:   Mental status: Awake, alert, and oriented to person, place, time and location/situation  Memory: recent and remote memory intact  Attention: good span, follows conversation  Fund of knowledge: appropriate   Level of consciousness: alert    Speech:    Normal Abnormal   Fluency x    Comprehension x    Articulation x    Repetition     Naming         Cranial Nerves:    Normal Abnormal   II Pupils equal (2mm), round, reactive. Fundus unremarkable    III, IV, VI EOMI, no nystagmus    V Intact to LT in V1-3    VII Face symmetrical, eye closure symmetrical    VIII Hearing intact to finger rub    IX, X Uvula midline and palate elevation symmetrical    XI Shrug equal     XII Tongue midline        Muscle/motor:   Tone: nml  Bulk: nml     Right  Left   Right  Left    Shoulder abductors:  5  5 Hip flexors:  5  5 Elbow flexors:  5  5 Hip abductors:       Elbow extensors:  5  5 Hip extensors:      Wrist extensors:  5  5 Hip adductors:      Wrist flexors:  5  5  Knee flexors:  5  5   Finger flexors:    Knee extensors:  5  5  Finger extensors:    Ankle plantar flexors:      Finger abductors:    Ankle dorsiflexors:      Thumb abductors:    Ankle inversion:         Ankle eversion:          Toe extensors:          Toe flexors:       No abnormal movements, rigidity or spasticity    Reflexes:    Right Left   Triceps     Biceps 2 2   Brachioradialis 2 2   Patella 2 2   Ankle     Plantar     Other reflexes:  Jaw: absent  Hoffman: absent    Sensation:    Normal RUE LUE RLE LLE   Light Touch x       Pin Prick        Temperature x       Vibration        Proprioception        Sensory level: absent    Coordination:    Normal Abnormal Right Abnormal Left   Finger to Nose x     Rapid alternating       Heel to [employer] tap      Foot tap      Other        Gait and Station:  Regular gait: normal base and arm swing       Assessment and Plan:    Problem   Bilateral Arm Weakness    Describes neck manipulation at chiropractor starting in May, bilateral arm weakness/numbness/pain that began one week ago, and ongoing symptoms of bi-occipital, posterior neck pain moving anteriorly since June 2020     Bilateral Occipital Neuralgia    Describes bilateral neck/occipital region pain that moves anteriorly. Tenderness to palpation on bilateral trap>occipital grooves.     Currently being seen by chronic anesthesiology for nerve blocks      Vestibulopathy    Longstanding issues with dizziness. Described as a jerking sensation lasting seconds.     MRI brain with and without contrast negative    Evaluation by neuro-otologist did not find any pathological findings    Continues to endorse some intermittent dizziness      Chronic Insomnia    - trouble with sleep maintenance   - Components of psychophysiologic insomnia and anxiety - Has not used clonazepam for this, had leg cramps with lunesta  - poor sleep hygiene     Follows with Dr. Andria Meuse, currently non compliant with CPAP but does use supplemental oxygen overnight and being worked up for adequate use      Tension Headache    frontal/L eye pressure since her 30s with conjunctival injection/photo/phono.  Able to work through this without any medication.  Sleeps in positions that provoke neck/back pain.  Significant greater occipital tenderness on exam  Improved with treatment of OSA, and after occipital nerve blocks (though poorly tolerated as above)  History of frontal meningocele (resected), head trauma (fell down stairs)    Currently taking a swig of liquid tylenol x2 a week to help treat          Vestibulopathy  Referral for vestibular rehab     Bilateral arm weakness  Will obtain MRI C spine with and without contrast to rule out cervical stenosis/disc protrusion - physical exam reassuring  Will obtain CTA head and neck to rule out  vertebral artery dissection from neck manipulation     Bilateral occipital neuralgia  Continue with nerve block assessment with chronic anesthesiology    Tension headache  Continue taking depakote, will switch from wellbutrin to cymbalta   Migraine cocktail for acute headache: tylenol, benadryl, compazine     Chronic insomnia  Continue assessment and treatment for OSA and chronic insomnia  Klonopin 2 mg prn       Follow up in 6 months    Patient seen and discussed with Dr. Berline Lopes, MD  PGY-4/Neurology  [phone]

## 2019-05-27 NOTE — Assessment & Plan Note
Continue assessment and treatment for OSA and chronic insomnia  Klonopin 2 mg prn

## 2019-05-27 NOTE — Assessment & Plan Note
Continue with nerve block assessment with chronic anesthesiology

## 2019-05-27 NOTE — Assessment & Plan Note
Continue taking depakote, will switch from wellbutrin to cymbalta   Migraine cocktail for acute headache: tylenol, benadryl, compazine

## 2019-05-28 ENCOUNTER — Encounter: Admit: 2019-05-28 | Discharge: 2019-05-28 | Payer: Commercial Managed Care - HMO

## 2019-05-30 MED ORDER — VERAPAMIL 40 MG PO TAB
ORAL_TABLET | Freq: Two times a day (BID) | ORAL | 2 refills | 90.00000 days | Status: DC
Start: 2019-05-30 — End: 2019-06-21

## 2019-05-31 ENCOUNTER — Encounter

## 2019-05-31 DIAGNOSIS — J449 Chronic obstructive pulmonary disease, unspecified: Secondary | ICD-10-CM

## 2019-06-02 ENCOUNTER — Encounter: Admit: 2019-06-02 | Discharge: 2019-06-02 | Payer: Commercial Managed Care - HMO

## 2019-06-03 ENCOUNTER — Ambulatory Visit: Admit: 2019-06-03 | Discharge: 2019-06-03 | Payer: Commercial Managed Care - HMO

## 2019-06-03 ENCOUNTER — Encounter: Admit: 2019-06-03 | Discharge: 2019-06-03 | Payer: Commercial Managed Care - HMO

## 2019-06-03 DIAGNOSIS — M5481 Occipital neuralgia: Secondary | ICD-10-CM

## 2019-06-03 DIAGNOSIS — G4733 Obstructive sleep apnea (adult) (pediatric): Secondary | ICD-10-CM

## 2019-06-03 DIAGNOSIS — N3010 Interstitial cystitis (chronic) without hematuria: Secondary | ICD-10-CM

## 2019-06-03 DIAGNOSIS — R519 Generalized headaches: Secondary | ICD-10-CM

## 2019-06-03 DIAGNOSIS — F99 Mental disorder, not otherwise specified: Secondary | ICD-10-CM

## 2019-06-03 DIAGNOSIS — R42 Dizziness and giddiness: Secondary | ICD-10-CM

## 2019-06-03 DIAGNOSIS — T753XXA Motion sickness, initial encounter: Secondary | ICD-10-CM

## 2019-06-03 DIAGNOSIS — K219 Gastro-esophageal reflux disease without esophagitis: Secondary | ICD-10-CM

## 2019-06-03 DIAGNOSIS — M1990 Unspecified osteoarthritis, unspecified site: Secondary | ICD-10-CM

## 2019-06-03 DIAGNOSIS — G43909 Migraine, unspecified, not intractable, without status migrainosus: Secondary | ICD-10-CM

## 2019-06-03 DIAGNOSIS — M549 Dorsalgia, unspecified: Secondary | ICD-10-CM

## 2019-06-03 DIAGNOSIS — M7918 Myalgia, other site: Secondary | ICD-10-CM

## 2019-06-03 DIAGNOSIS — Z720 Tobacco use: Secondary | ICD-10-CM

## 2019-06-03 DIAGNOSIS — K859 Acute pancreatitis without necrosis or infection, unspecified: Secondary | ICD-10-CM

## 2019-06-03 DIAGNOSIS — I10 Essential (primary) hypertension: Secondary | ICD-10-CM

## 2019-06-03 DIAGNOSIS — J302 Other seasonal allergic rhinitis: Secondary | ICD-10-CM

## 2019-06-03 DIAGNOSIS — R0600 Dyspnea, unspecified: Secondary | ICD-10-CM

## 2019-06-03 DIAGNOSIS — E7800 Pure hypercholesterolemia, unspecified: Secondary | ICD-10-CM

## 2019-06-06 MED ORDER — LIDOCAINE (PF) 20 MG/ML (2 %) IJ SOLN
1 mL | Freq: Once | INTRAMUSCULAR | 0 refills | Status: CP | PRN
Start: 2019-06-06 — End: ?
  Administered 2019-06-06: 15:00:00 1 mL via INTRAMUSCULAR

## 2019-06-06 MED ORDER — BUPIVACAINE (PF) 0.5 % (5 MG/ML) IJ SOLN
1 mL | Freq: Once | INTRAMUSCULAR | 0 refills | Status: CP | PRN
Start: 2019-06-06 — End: ?
  Administered 2019-06-06: 15:00:00 1 mL via INTRAMUSCULAR

## 2019-06-06 MED ORDER — LIDOCAINE (PF) 20 MG/ML (2 %) IJ SOLN
5 mL | Freq: Once | INTRAMUSCULAR | 0 refills | Status: CP | PRN
Start: 2019-06-06 — End: ?
  Administered 2019-06-06: 15:00:00 5 mL via INTRAMUSCULAR

## 2019-06-06 MED ORDER — DEXAMETHASONE SODIUM PHOS (PF) 10 MG/ML IJ SOLN
5 mg | Freq: Once | INTRAMUSCULAR | 0 refills | Status: CP | PRN
Start: 2019-06-06 — End: ?
  Administered 2019-06-06: 15:00:00 5 mg via INTRAMUSCULAR

## 2019-06-06 MED ORDER — BUPIVACAINE (PF) 0.25 % (2.5 MG/ML) IJ SOLN
5 mL | Freq: Once | INTRAMUSCULAR | 0 refills | Status: CP | PRN
Start: 2019-06-06 — End: ?
  Administered 2019-06-06: 15:00:00 5 mL via INTRAMUSCULAR

## 2019-06-07 ENCOUNTER — Encounter: Admit: 2019-06-07 | Discharge: 2019-06-07 | Payer: Commercial Managed Care - HMO

## 2019-06-07 NOTE — Telephone Encounter
Please send the following for continuity of care:    Records from the [facility] ER visit 06/04/19  Please include the ER physician note, labs, ekg, and other testing.  Please fax to [facility] at [phone]    Thank You,   Welton Bord [facility]  Cardiovascular Medicine  [place] [address]    Phone: [phone]  Fax: [phone]

## 2019-06-07 NOTE — Telephone Encounter
06/07/2019 2:13 PM   Recommendations shared with patient.  Wilder Glade, LPN      ---- Message -----  From: Nehemiah Massed, MD  Sent: 06/07/2019   2:01 PM CDT  To: Wilder Glade, LPN, Betsy Pries, RN, *    Lattie Haw and Richardson Landry.  No recommendations based upon limited information provided.  Please have her seen by a provider if she is having any clinical issues.  Thanks.  SBG

## 2019-06-09 LAB — POC TROPONIN: Lab: 0 ng/mL (ref 0.00–0.05)

## 2019-06-09 LAB — BNP POC ER: Lab: 72 pg/mL (ref 0–100)

## 2019-06-09 MED ORDER — ALUMINUM-MAGNESIUM HYDROXIDE 200-200 MG/5 ML PO SUSP
30 mL | ORAL | 0 refills | Status: DC | PRN
Start: 2019-06-09 — End: 2019-06-10

## 2019-06-09 MED ORDER — FENTANYL CITRATE (PF) 50 MCG/ML IJ SOLN
50 ug | INTRAVENOUS | 0 refills | Status: DC | PRN
Start: 2019-06-09 — End: 2019-06-10

## 2019-06-09 MED ORDER — ONDANSETRON HCL (PF) 4 MG/2 ML IJ SOLN
4 mg | INTRAVENOUS | 0 refills | Status: DC | PRN
Start: 2019-06-09 — End: 2019-06-10
  Administered 2019-06-10: 09:00:00 4 mg via INTRAVENOUS

## 2019-06-09 MED ORDER — MORPHINE 2 MG/ML IV SYRG
2 mg | Freq: Once | INTRAVENOUS | 0 refills | Status: CP
Start: 2019-06-09 — End: ?
  Administered 2019-06-09: 2 mg via INTRAVENOUS

## 2019-06-09 MED ORDER — FENTANYL CITRATE (PF) 50 MCG/ML IJ SOLN
50 ug | Freq: Once | INTRAVENOUS | 0 refills | Status: CP
Start: 2019-06-09 — End: ?
  Administered 2019-06-10: 04:00:00 50 ug via INTRAVENOUS

## 2019-06-09 MED ORDER — MORPHINE 2 MG/ML IV SYRG
2 mg | Freq: Once | INTRAVENOUS | 0 refills | Status: CP
Start: 2019-06-09 — End: ?
  Administered 2019-06-10: 01:00:00 2 mg via INTRAVENOUS

## 2019-06-09 NOTE — ED Notes
48 y.o. female arrived to ED via POV with CC of chest pain and SOB that has been going on x 1 week. Pt states she had neck and shoulder injections performed last Thursday for treatment of a migraine and began feeling bad that evening. Pt characterizes the pain in her chest as pressure and states it is worse when laying flat. Pt states her HR has been in the 50's which is low for her and has been making her nervous. Pt states she has also had a cough x 3 days. Pt unsure if she has had a fever but reports chills. Pt denies N/V. Pt aaox4; breathing non-labored; skin warm/dry/appropriate for ethnicity.    Pt belongings: t shirt, pants, tennis shoes, glasses, purse, cell phone

## 2019-06-10 ENCOUNTER — Observation Stay: Admit: 2019-06-10 | Discharge: 2019-06-10 | Payer: Commercial Managed Care - HMO

## 2019-06-10 ENCOUNTER — Encounter: Admit: 2019-06-10 | Discharge: 2019-06-10 | Payer: Commercial Managed Care - HMO

## 2019-06-10 DIAGNOSIS — E7800 Pure hypercholesterolemia, unspecified: Secondary | ICD-10-CM

## 2019-06-10 DIAGNOSIS — T753XXA Motion sickness, initial encounter: Secondary | ICD-10-CM

## 2019-06-10 DIAGNOSIS — R0600 Dyspnea, unspecified: Secondary | ICD-10-CM

## 2019-06-10 DIAGNOSIS — N3010 Interstitial cystitis (chronic) without hematuria: Secondary | ICD-10-CM

## 2019-06-10 DIAGNOSIS — J302 Other seasonal allergic rhinitis: Secondary | ICD-10-CM

## 2019-06-10 DIAGNOSIS — F99 Mental disorder, not otherwise specified: Secondary | ICD-10-CM

## 2019-06-10 DIAGNOSIS — R519 Generalized headaches: Secondary | ICD-10-CM

## 2019-06-10 DIAGNOSIS — I10 Essential (primary) hypertension: Secondary | ICD-10-CM

## 2019-06-10 DIAGNOSIS — R42 Dizziness and giddiness: Secondary | ICD-10-CM

## 2019-06-10 DIAGNOSIS — K859 Acute pancreatitis without necrosis or infection, unspecified: Secondary | ICD-10-CM

## 2019-06-10 DIAGNOSIS — Z720 Tobacco use: Secondary | ICD-10-CM

## 2019-06-10 DIAGNOSIS — M1990 Unspecified osteoarthritis, unspecified site: Secondary | ICD-10-CM

## 2019-06-10 DIAGNOSIS — G4733 Obstructive sleep apnea (adult) (pediatric): Secondary | ICD-10-CM

## 2019-06-10 DIAGNOSIS — K219 Gastro-esophageal reflux disease without esophagitis: Secondary | ICD-10-CM

## 2019-06-10 DIAGNOSIS — M549 Dorsalgia, unspecified: Secondary | ICD-10-CM

## 2019-06-10 DIAGNOSIS — G43909 Migraine, unspecified, not intractable, without status migrainosus: Secondary | ICD-10-CM

## 2019-06-10 LAB — COMPREHENSIVE METABOLIC PANEL
Lab: 0.5 mg/dL (ref 0.3–1.2)
Lab: 0.6 mg/dL (ref 0.4–1.00)
Lab: 102 MMOL/L — ABNORMAL HIGH (ref 98–110)
Lab: 11 10*3/uL — ABNORMAL HIGH (ref 3–12)
Lab: 13 mg/dL (ref 7–25)
Lab: 141 MMOL/L (ref 137–147)
Lab: 17 U/L (ref 7–40)
Lab: 17 U/L (ref 7–56)
Lab: 28 MMOL/L (ref 21–30)
Lab: 3.5 MMOL/L (ref 3.5–5.1)
Lab: 4.6 g/dL (ref 3.5–5.0)
Lab: 76 mg/dL — ABNORMAL HIGH (ref 70–100)
Lab: 84 U/L (ref 25–110)
Lab: >60 mL/min (ref >60)
Lab: >60 mL/min (ref >60)

## 2019-06-10 LAB — COVID-19 (SARS-COV-2) PCR

## 2019-06-10 LAB — CBC AND DIFF
Lab: 0 10*3/uL (ref 0–0.20)
Lab: 0.1 10*3/uL (ref 0–0.45)
Lab: 12 10*3/uL — ABNORMAL HIGH (ref 4.5–11.0)
Lab: 20 (ref <20.7)

## 2019-06-10 LAB — POC TROPONIN: Lab: 0 ng/mL — ABNORMAL HIGH (ref 0.00–0.05)

## 2019-06-10 LAB — D-DIMER: Lab: 227 ng{FEU}/mL (ref <500)

## 2019-06-10 MED ORDER — CLONAZEPAM 1 MG PO TAB
1 mg | Freq: Two times a day (BID) | ORAL | 0 refills | Status: DC
Start: 2019-06-10 — End: 2019-06-10
  Administered 2019-06-10: 14:00:00 1 mg via ORAL

## 2019-06-10 MED ORDER — RP DX N-13 AMMONIA MCI
22 | Freq: Once | INTRAVENOUS | 0 refills | Status: CP
Start: 2019-06-10 — End: ?
  Administered 2019-06-10: 13:00:00 22.8 via INTRAVENOUS

## 2019-06-10 MED ORDER — SODIUM CHLORIDE 0.9 % IV SOLP
250 mL | INTRAVENOUS | 0 refills | Status: DC | PRN
Start: 2019-06-10 — End: 2019-06-10

## 2019-06-10 MED ORDER — REGADENOSON 0.4 MG/5 ML IV SYRG
.4 mg | Freq: Once | INTRAVENOUS | 0 refills | Status: CP
Start: 2019-06-10 — End: ?
  Administered 2019-06-10: 13:00:00 0.4 mg via INTRAVENOUS

## 2019-06-10 MED ORDER — DIVALPROEX 250 MG PO TB24
250 mg | Freq: Two times a day (BID) | ORAL | 0 refills | Status: DC
Start: 2019-06-10 — End: 2019-06-10

## 2019-06-10 MED ORDER — ACETAMINOPHEN 160 MG/5 ML PO SOLN
650 mg | Freq: Once | ORAL | 0 refills | Status: DC
Start: 2019-06-10 — End: 2019-06-10

## 2019-06-10 MED ORDER — EUCALYPTUS-MENTHOL MM LOZG
1 | Freq: Once | ORAL | 0 refills | Status: DC | PRN
Start: 2019-06-10 — End: 2019-06-10

## 2019-06-10 MED ORDER — ALBUTEROL SULFATE 90 MCG/ACTUATION IN HFAA
2 | RESPIRATORY_TRACT | 0 refills | Status: DC | PRN
Start: 2019-06-10 — End: 2019-06-10

## 2019-06-10 MED ORDER — NITROGLYCERIN 0.4 MG SL SUBL
.4 mg | SUBLINGUAL | 0 refills | Status: DC | PRN
Start: 2019-06-10 — End: 2019-06-10

## 2019-06-10 MED ORDER — ROSUVASTATIN 20 MG PO TAB
20 mg | Freq: Every evening | ORAL | 0 refills | Status: DC
Start: 2019-06-10 — End: 2019-06-10

## 2019-06-10 MED ORDER — AMINOPHYLLINE 500 MG/20 ML IV SOLN
50 mg | INTRAVENOUS | 0 refills | Status: DC | PRN
Start: 2019-06-10 — End: 2019-06-10

## 2019-06-10 NOTE — Telephone Encounter
Received call from patient reporting her insurance is requiring a peer-to-peer for her MRI cervical spine and she is wondering if she needs to reschedule it.    Returned call to patient and informed that Dr. Owens Loffler is aware and has been discussing it with our pre-certification team. Informed someone will contact patient if we do not get approval and need to cancel her appointment on Tuesday.

## 2019-06-10 NOTE — ED Notes
Report given to Hayley, RN

## 2019-06-10 NOTE — ED Notes
ADMIT TO CLINICAL THROUGHPUT UNIT - OBSERVATION    Christina Garrison is a 48 y.o. female who presented to the Emergency Department.  The Emergency Department note including history, review of systems, physical exam, diagnostic testing and results was reviewed and confirmed.      History:    History, provided by patient,    Pt has had increasing CP for several days.      Pt has some SOA w the CP as well as a baseline of feeling short of breath with some exercise intolerance this week as well.  She notes a dry cough 2-3 days w/o fever but has had alternating chills and sweats. denies known exposure to covid.      The patient has a medical history of hypertension, hyperlipidemia and GERD and gastric sleeve, migraines, pancreatitis, interstitial cystitis, depression and anxiety.        The patient's family has a strong history of CAD and MI. Mother and father late 3s and early 49s CAD/ MI, both grandparents also had CAD in their 45s and 5s.  All were smokers except her mother who quit at some point.      The patient's social history includes:   Smoking: former smoker, quit 7 years ago 45 pack year hx  Alcohol: no current use  Drug Use: Never    The patient reports previous cardiac testing of stress test: normal.    TIMI Score:     -Risk Factors - need 3 to get 1 point:   -Dyslipidemia, HTN and FH of premature CAD     -1 point for each of the following:   -Recent < 24hr severe angina     -Total TIMI Score: 2    Using Heart Score criteria as below...   2 points 1 point 0 points   History Highly suspicious Moderately suspicious Slightly suspicious   EKG Significant ST-depression Non-specific repolarization disturbance   normal   Age ?65 45-65 <45   Risk Factors* ?3 or history of atherosclerotic disease 1-2 No risk factors known   Troponin ?3 times normal limit 1-3 times normal limit ?normal limit   * risk factors = HLD, HTN, DM, tobacco use, positive family history, obesity .Marland KitchenMarland Kitchenpt with Heart Score of 5, which predicts risk of major adverse cardiac event as    low (0-3 points), with 0.9-1.7% risk over the next 6 weeks.  moderate (4-7 points), with 12.0-16.6% risk over the next 6 weeks.  high (8-10 points), with 5--65% risk over the next 6 weeks.        Exam:    BP: 130/71 (10/08 2100)  Temp: 36.2 ?C (97.2 ?F) (10/08 1500)  Pulse: 54 (10/08 2100)  Respirations: 16 PER MINUTE (10/08 2100)  SpO2: 97 % (10/08 2100)  SpO2 Pulse: 54 (10/08 2100)  BP: (121-142)/(51-94)   Temp:  [36.2 ?C (97.2 ?F)]   Pulse:  [54-77]   Respirations:  [12 PER MINUTE-18 PER MINUTE]   SpO2:  [95 %-99 %]     BP 130/71  - Pulse 54  - Temp 36.2 ?C (97.2 ?F)  - SpO2 97%   General appearance: alert, well-developed, well-nourished, cooperative, no distress and moderately obese  Lungs: clear to auscultation bilaterally, good air movt, no wz  Heart: regular rate and rhythm, S1, S2 normal, no murmur, click, rub or gallop  Extremities: no edema, redness or tenderness in the calves or thighs  Pulses: 2+ and symmetric      ECG: normal sinus rhythm, no blocks  or conduction defects, no ischemic changes, WNL and isolated TWI lead III, no change from prior 1/20    Labs:    24-hour labs:    Results for orders placed or performed during the hospital encounter of 06/09/19 (from the past 24 hour(s))   CBC AND DIFF    Collection Time: 06/09/19  5:31 PM   Result Value Ref Range    White Blood Cells 12.0 (H) 4.5 - 11.0 K/UL    RBC 4.44 4.0 - 5.0 M/UL    Hemoglobin 15.0 12.0 - 15.0 GM/DL    Hematocrit 16.1 (H) 36 - 45 %    MCV 101.8 (H) 80 - 100 FL    MCH 33.8 26 - 34 PG    MCHC 33.2 32.0 - 36.0 G/DL    RDW 09.6 11 - 15 %    Platelet Count 290 150 - 400 K/UL    MPV 9.4 7 - 11 FL    Neutrophils 60 41 - 77 %    Lymphocytes 31 24 - 44 %    Monocytes 7 4 - 12 %    Eosinophils 1 0 - 5 %    Basophils 1 0 - 2 %    Absolute Neutrophil Count 7.23 (H) 1.8 - 7.0 K/UL    Absolute Lymph Count 3.78 1.0 - 4.8 K/UL Absolute Monocyte Count 0.79 0 - 0.80 K/UL    Absolute Eosinophil Count 0.17 0 - 0.45 K/UL    Absolute Basophil Count 0.07 0 - 0.20 K/UL    MDW (Monocyte Distribution Width) 20.5 <20.7   COMPREHENSIVE METABOLIC PANEL    Collection Time: 06/09/19  5:31 PM   Result Value Ref Range    Sodium 141 137 - 147 MMOL/L    Potassium 3.5 3.5 - 5.1 MMOL/L    Chloride 102 98 - 110 MMOL/L    Glucose 76 70 - 100 MG/DL    Blood Urea Nitrogen 13 7 - 25 MG/DL    Creatinine 0.45 0.4 - 1.00 MG/DL    Calcium 9.6 8.5 - 40.9 MG/DL    Total Protein 7.1 6.0 - 8.0 G/DL    Total Bilirubin 0.5 0.3 - 1.2 MG/DL    Albumin 4.6 3.5 - 5.0 G/DL    Alk Phosphatase 84 25 - 110 U/L    AST (SGOT) 17 7 - 40 U/L    CO2 28 21 - 30 MMOL/L    ALT (SGPT) 17 7 - 56 U/L    Anion Gap 11 3 - 12    eGFR Non African American >60 >60 mL/min    eGFR African American >60 >60 mL/min   D-DIMER    Collection Time: 06/09/19  5:31 PM   Result Value Ref Range    D-Dimer 227 <500 ng/mL FEU   POC TROPONIN    Collection Time: 06/09/19  5:34 PM   Result Value Ref Range    Troponin-I-POC 0.00 0.00 - 0.05 NG/ML   COVID-19 (SARS-COV-2) PCR    Collection Time: 06/09/19  6:29 PM    Specimen: Nasopharyngeal Swab   Result Value Ref Range    COVID-19 (SARS-CoV-2) PCR Source NASOPHARYNGEAL SWAB     COVID-19 (SARS-CoV-2) PCR NOT DETECTED DN-NOT DETECTED   BNP POC ER    Collection Time: 06/09/19  6:31 PM   Result Value Ref Range    BNP POC 72.0 0 - 100 PG/ML   POC TROPONIN    Collection Time: 06/09/19  9:25 PM   Result Value Ref Range  Troponin-I-POC 0.00 0.00 - 0.05 NG/ML       Diagnosis:    1. Chest Pain: will get serial troponins. Plan for stress test. Continue to observe closely for any changes. Will give ASA if not given or allergic.      Resident Supervision Note concerning Bridgitt E Plamondon: I personally performed the E/M including history, physical exam, and MDM.    Reggie Pile, MD

## 2019-06-10 NOTE — ED Notes
Records request form filled out and given to unit secretary.

## 2019-06-14 ENCOUNTER — Encounter: Admit: 2019-06-14 | Discharge: 2019-06-14 | Payer: Commercial Managed Care - HMO

## 2019-06-14 ENCOUNTER — Ambulatory Visit: Admit: 2019-06-14 | Discharge: 2019-06-14 | Payer: Commercial Managed Care - HMO

## 2019-06-14 DIAGNOSIS — M542 Cervicalgia: Secondary | ICD-10-CM

## 2019-06-14 DIAGNOSIS — R29898 Other symptoms and signs involving the musculoskeletal system: Secondary | ICD-10-CM

## 2019-06-14 DIAGNOSIS — R42 Dizziness and giddiness: Secondary | ICD-10-CM

## 2019-06-14 MED ORDER — IOHEXOL 350 MG IODINE/ML IV SOLN
70 mL | Freq: Once | INTRAVENOUS | 0 refills | Status: CP
Start: 2019-06-14 — End: ?
  Administered 2019-06-14: 20:00:00 70 mL via INTRAVENOUS

## 2019-06-14 MED ORDER — SODIUM CHLORIDE 0.9 % IJ SOLN
50 mL | Freq: Once | INTRAVENOUS | 0 refills | Status: CP
Start: 2019-06-14 — End: ?
  Administered 2019-06-14: 20:00:00 50 mL via INTRAVENOUS

## 2019-06-15 ENCOUNTER — Encounter: Admit: 2019-06-15 | Discharge: 2019-06-15 | Payer: Commercial Managed Care - HMO

## 2019-06-15 ENCOUNTER — Ambulatory Visit: Admit: 2019-06-15 | Discharge: 2019-06-15 | Payer: Commercial Managed Care - HMO

## 2019-06-15 DIAGNOSIS — G4733 Obstructive sleep apnea (adult) (pediatric): Secondary | ICD-10-CM

## 2019-06-15 DIAGNOSIS — I10 Essential (primary) hypertension: Secondary | ICD-10-CM

## 2019-06-15 DIAGNOSIS — E7800 Pure hypercholesterolemia, unspecified: Secondary | ICD-10-CM

## 2019-06-15 DIAGNOSIS — R42 Dizziness and giddiness: Secondary | ICD-10-CM

## 2019-06-15 DIAGNOSIS — T753XXA Motion sickness, initial encounter: Secondary | ICD-10-CM

## 2019-06-15 DIAGNOSIS — M549 Dorsalgia, unspecified: Secondary | ICD-10-CM

## 2019-06-15 DIAGNOSIS — K219 Gastro-esophageal reflux disease without esophagitis: Secondary | ICD-10-CM

## 2019-06-15 DIAGNOSIS — Z720 Tobacco use: Secondary | ICD-10-CM

## 2019-06-15 DIAGNOSIS — M1990 Unspecified osteoarthritis, unspecified site: Secondary | ICD-10-CM

## 2019-06-15 DIAGNOSIS — R519 Generalized headaches: Secondary | ICD-10-CM

## 2019-06-15 DIAGNOSIS — G43909 Migraine, unspecified, not intractable, without status migrainosus: Secondary | ICD-10-CM

## 2019-06-15 DIAGNOSIS — N3010 Interstitial cystitis (chronic) without hematuria: Secondary | ICD-10-CM

## 2019-06-15 DIAGNOSIS — K859 Acute pancreatitis without necrosis or infection, unspecified: Secondary | ICD-10-CM

## 2019-06-15 DIAGNOSIS — J302 Other seasonal allergic rhinitis: Secondary | ICD-10-CM

## 2019-06-15 DIAGNOSIS — F99 Mental disorder, not otherwise specified: Secondary | ICD-10-CM

## 2019-06-15 DIAGNOSIS — R0600 Dyspnea, unspecified: Secondary | ICD-10-CM

## 2019-06-15 DIAGNOSIS — E669 Obesity, unspecified: Secondary | ICD-10-CM

## 2019-06-15 MED ORDER — NICOTINE 10 MG IN CRTG
1 | RESPIRATORY_TRACT | 11 refills | Status: DC | PRN
Start: 2019-06-15 — End: 2019-10-26

## 2019-06-16 ENCOUNTER — Encounter: Admit: 2019-06-16 | Discharge: 2019-06-16 | Payer: Commercial Managed Care - HMO

## 2019-06-16 DIAGNOSIS — E785 Hyperlipidemia, unspecified: Secondary | ICD-10-CM

## 2019-06-16 LAB — LIPID PROFILE
Lab: 21
Lab: 3
Lab: 69
Lab: 107 mL/min (ref >60)
Lab: 134 (ref 3–12)
Lab: 44 mL/min (ref >60)

## 2019-06-16 LAB — ALT (SGPT): Lab: 18 MMOL/L (ref 21–30)

## 2019-06-16 NOTE — Telephone Encounter
Nehemiah Massed, MD  Andi Devon, RN              Lennette Bihari: Labs look good. Please let her know. Thanks. SBG

## 2019-06-16 NOTE — Telephone Encounter
called and explained results to pt. she denied any other questions at this time

## 2019-06-17 ENCOUNTER — Encounter: Admit: 2019-06-17 | Discharge: 2019-06-17 | Payer: Commercial Managed Care - HMO

## 2019-06-17 NOTE — Telephone Encounter
Pt left voicemail with questions regarding notes that were released to MyChart.     Spoke with pt.  Pt states that after reading Dr. Ascencion Dike note, it seemed as though the impression is that pt does not have any breathing issues and if that is the case, pt does not wish to proceed with testing if unnecessary.      Advised pt that PFTs are necessary to determine lung function and will give a clearer idea of breathing issues and what is contributing to shortness of breath.  Pt voiced understanding and agreeable to completing recommended testing.

## 2019-06-21 ENCOUNTER — Encounter: Admit: 2019-06-21 | Discharge: 2019-06-21 | Payer: Commercial Managed Care - HMO

## 2019-06-21 DIAGNOSIS — K219 Gastro-esophageal reflux disease without esophagitis: Secondary | ICD-10-CM

## 2019-06-21 DIAGNOSIS — N3010 Interstitial cystitis (chronic) without hematuria: Secondary | ICD-10-CM

## 2019-06-21 DIAGNOSIS — R42 Dizziness and giddiness: Secondary | ICD-10-CM

## 2019-06-21 DIAGNOSIS — E7800 Pure hypercholesterolemia, unspecified: Secondary | ICD-10-CM

## 2019-06-21 DIAGNOSIS — R0600 Dyspnea, unspecified: Secondary | ICD-10-CM

## 2019-06-21 DIAGNOSIS — J302 Other seasonal allergic rhinitis: Secondary | ICD-10-CM

## 2019-06-21 DIAGNOSIS — M549 Dorsalgia, unspecified: Secondary | ICD-10-CM

## 2019-06-21 DIAGNOSIS — I10 Essential (primary) hypertension: Secondary | ICD-10-CM

## 2019-06-21 DIAGNOSIS — G4733 Obstructive sleep apnea (adult) (pediatric): Secondary | ICD-10-CM

## 2019-06-21 DIAGNOSIS — M1990 Unspecified osteoarthritis, unspecified site: Secondary | ICD-10-CM

## 2019-06-21 DIAGNOSIS — Z720 Tobacco use: Secondary | ICD-10-CM

## 2019-06-21 DIAGNOSIS — T753XXA Motion sickness, initial encounter: Secondary | ICD-10-CM

## 2019-06-21 DIAGNOSIS — G43909 Migraine, unspecified, not intractable, without status migrainosus: Secondary | ICD-10-CM

## 2019-06-21 DIAGNOSIS — K859 Acute pancreatitis without necrosis or infection, unspecified: Secondary | ICD-10-CM

## 2019-06-21 DIAGNOSIS — F99 Mental disorder, not otherwise specified: Secondary | ICD-10-CM

## 2019-06-21 DIAGNOSIS — R519 Generalized headaches: Secondary | ICD-10-CM

## 2019-06-21 MED ORDER — VERAPAMIL 40 MG PO TAB
ORAL_TABLET | ORAL | 2 refills | 90.00000 days | Status: AC
Start: 2019-06-21 — End: ?

## 2019-06-21 NOTE — Progress Notes
Date of Service: 06/21/2019    Christina Garrison is a 48 y.o. female.       HPI     Christina Garrison has been followed for difficult to control hypertension.   On 06/03/2019 she received local pain injections for bilateral occipital neuralgia, myalgia, neuropathic pain, muscle spasm and myofascial pain.  It appears that on June 04, 2019 she was seen for chest discomfort in [place] and on June 09, 2019 she presented to [facility] hospital with chest discomfort.  There was no evidence for an acute coronary syndrome and stress testing was performed which showed no evidence of myocardial ischemia.  Her chest discomfort appears to be clearly musculoskeletal.  It is pinpoint and sharp and reproduced by palpation of her chest wall. Christina Garrison?reports that her blood pressures been under good control when she checks it at home. ?Her blood pressure typically runs in the range of 120/70-80 mmHg. ?  Her headaches and her depression are generally under reasonable good control.  She is followed by gastroenterology for esophageal reflux and esophageal spasm.  From a cardiovascular perspective, the patient has been doing well and reports no angina, congestive symptoms, palpitations, sensation of sustained forceful heart pounding, lightheadedness or syncope. Her exercise tolerance?is relatively stable and is limited by her lower back discomfort. ?She can walk for perhaps 10 or 15 minutes on the elliptical exercise machine before she needs to stop because of increased back discomfort.The patient reports no myalgias, bleeding abnormalities, neurologic motor abnormalities or difficulty with speech. However, she previously had mylgias while taking pravastatin. 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 ?Her verapamil has been decreased from 40 mg twice daily to 20 mg in the morning and 40 mg in the evening because of bradycardia. Christina Garrison reports that she has lost 30 pounds over the past 2 months through caloric restriction.     Historically regarding the treatment of her hypertension, Christina Garrison reports that an ACE inhibitor previously caused a cough and initially nebivolol was ineffective. At one time she was taking a thiazide diuretic, but this was stopped when it was thought to possibly have contributed to an episode of pancreatitis on October 30, 2010. Amlodipine 5 mg daily was started with good results. Nebivolol 5 mg daily was restarted and she has tolerated this without difficulty. Her past medical history is noteworthy for interstitial cystitis. Christina Garrison underwent laparoscopic cholecystectomy on 05/01/11 with resolution of her recurrent abdominal discomfort.?She reports chronic problems with recurrent sinus infections. 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 Christina Garrison reports that her surgery was complicated by fluid retention and pulmonary congestion.  ?  Christina Garrison is a prior cigarette smoker and had smoked a half-a-pack per day for 23 years. In September 2013 she stopped smoking.  However, she has been vaping for the past several years.  She has hyperlipidemia, but is not known to be diabetic. She has a family history for coronary disease. In 02/17/10 a brother died suddenly at age 38. Her father is still living, but has coronary disease at age 23. Her mother had coronary disease. Her maternal grandfather died from coronary disease at age 63 and her maternal grandmother died from coronary disease, also age 9. One sister has lung  cancer and 2 siblings have high blood pressure. No siblings have documented coronary disease, but as mentioned, her brother died suddenly in 02/10/10 at age 67. ? She underwent open repair of supraumbilical hernia with mesh on 09/23/2018 with excellent results.?         Vitals:    06/21/19 1530   BP: 110/70   BP Source: Arm, Left Upper   Pulse: 74   Temp: 36.7 ?C (98.1 ?F)   SpO2: 98%   Weight: 100.5 kg (221 lb 9.6 oz)   Height: 1.651 m (5' 5)   PainSc: Zero     Body mass index is 36.88 kg/m?Marland Kitchen     Past Medical History  Patient Active Problem List    Diagnosis Date Noted   ? Chest pain 06/09/2019   ? Bilateral arm weakness 05/27/2019     Describes neck manipulation at chiropractor starting in May, bilateral arm weakness/numbness/pain that began one week ago, and ongoing symptoms of bi-occipital, posterior neck pain moving anteriorly since 2019/02/11    ? Bilateral occipital neuralgia 05/27/2019     Describes bilateral neck/occipital region pain that moves anteriorly. Tenderness to palpation on bilateral trap>occipital grooves.     Currently being seen by chronic anesthesiology for nerve blocks      ? Primary insomnia 03/18/2018   ? Depression 02/12/2018   ? Nightmare 11/26/2015   ? 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 Described as a jerking sensation lasting seconds.     MRI brain with and without contrast negative    Evaluation by neuro-otologist did not find any pathological findings    Continues to endorse some intermittent dizziness      ? Obstructive sleep apnea syndrome 05/21/2015     PSG on 03/16/15: AHI 8.9 events per hour,The REM AHI was 21.3, supine REM AHI 37.? The overall supine AHI was 16 and lateral AHI was 0. Oxyhemoglobin saturation nadir of 84% and Time below 88% was 12.8 minutes.   PLMS 0/hr   12/19/2015 oximetry on 2 Liters, <88% 6 minutes.  Increase to 3 liters and recheck.  Much better headaches.     ? Obesity (BMI 30-39.9) 05/21/2015     Body mass index is 30.71 kg/(m^2).     ? Chronic insomnia 05/21/2015     - trouble with sleep maintenance   - Components of psychophysiologic insomnia and anxiety - Has not used clonazepam for this, had leg cramps with lunesta  - poor sleep hygiene     Follows with Dr. Andria Meuse, currently non compliant with CPAP but does use supplemental oxygen overnight and being worked up for adequate use      ? Sleep disorder 11/09/2014   ? 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 Hyperlipidemia 08/26/2012   ? Vertigo 08/11/2012     Spells for years of intermittent dizziness, rarely with vertigo but now described more in terms of altered cognition., initially positionally provoked, now lasting for only seconds.  Occurs daily currently.  Initially with feeling of L ear fullness and pain.  Has been associated with headaches as well  Does have a h/o falling down stairs hitting her head and neck 20 years ago  MRI wo/w + IAC 10/2012 without significant abnormalities  Neuro-otology workup with likely central cause of dizziness (normal audiogram and mostly normal VEMPs, normal electronystagmography, no canal dehiscence on imaging)  Has undergone PT for vestibular and cervical causes  EEG (routine) normal    Current treatment: Clonazepam 1mg  qAM, qPM,  Depakote 250 BID (for mood), Acupuncture (has had some response)    Prior trials: Prozac (SE), Effexor (suicidal thoughts), Topamax (speech difficulties), nortriptyline, Indomethacin (helped headache but GI SE), Magnesium/Riboflavin/coQ10, Propranolol (changed to Bystolic), Verapamil.  Occipital block with poor tolerance due to lasting pain at injection site     ? Tension headache 08/11/2012     frontal/L eye pressure since her 30s with conjunctival injection/photo/phono.  Able to work through this without any medication.  Sleeps in positions that provoke neck/back pain.  Significant greater occipital tenderness on exam  Improved with treatment of OSA, and after occipital nerve blocks (though poorly tolerated as above)  History of frontal meningocele (resected), head trauma (fell down stairs) Currently taking a swig of liquid tylenol x2 a week to help treat     ? Allergic rhinitis 03/29/2012     Pt is a 48 yo F who presents to ENT clinic for evaluation of her sinus allergies.  Patient states that she has a 1 year history of sinus pressure and congestion.  Usual she has congestion behind her eyes and in her cheeks.  + Facial pain.  Symptoms are worse in spring and summer.  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  Patient has taken claritin however it makes her dizzy. She is currently doing daily sinus rinses as well as flonase.  Currently she feels her sinuses are not that bad.    Patient has secondary complaint of dizziness for 1 year.  It is an off balance sensation when she is driving or bending over.  Lasts only momentarily. No sensation of the room spinning. No hearing loss, tinnitus. Some L ear fullness.     ? Chest pain of uncertain etiology 11/29/2010   ? Pancreatitis 11/21/2010     2/12 Hospitalization [facility].     ? Essential hypertension 11/21/2010         Review of Systems   Constitution: Negative.   HENT: Negative.    Eyes: Negative.    Respiratory: Negative.    Endocrine: Negative.    Hematologic/Lymphatic: Negative.    Skin: Negative.    Musculoskeletal: Positive for muscle cramps.   Gastrointestinal: Negative.    Genitourinary: Negative.    Neurological: Positive for dizziness and headaches.   Psychiatric/Behavioral: Negative.    Allergic/Immunologic: Negative.        Physical Exam  GENERAL: The patient is well developed, well nourished, resting comfortably and in no distress. ?  HEENT: No abnormalities of the visible oro-nasopharynx, conjunctiva or sclera are noted.  NECK: There is no jugular venous distension. Carotids are palpable and without bruits. There is no thyroid enlargement.  Chest: Lung fields are clear to auscultation. There are no wheezes or crackles.  She appears to have multifocal costochondritis along with multiple trigger trigger points which completely reproduces her chest discomfort.  CV: There is a regular rhythm. The first and second heart sounds are normal. There are no murmurs, gallops or rubs. Her apical heart rate is 72 BPM.  ABD: The abdomen is soft and supple with normal bowel sounds. There is no hepatosplenomegaly, ascites, tenderness, masses or bruits.  Neuro: There are no focal motor defects. Ambulation is normal. Cognitive function appears normal.  Ext:?There is no edema or evidence of deep vein thrombosis. Peripheral pulses are satisfactory. ?  SKIN:?There are no rashes and no cellulitis  PSYCH:?The patient is calm, rationale and oriented    Cardiovascular Studies  A twelve-lead ECG obtained on June 09, 2019 reveals normal sinus rhythm with a heart rate of 60 bpm.  There is no evidence of myocardial ischemia or infarction.    PET/CT stress study 06/10/2019:  Normal pharmacologic stress myocardial perfusion, flow quantification, and   function.   No significant coronary calcification.     Problems Addressed Today  Encounter Diagnoses   Name Primary?   ? Essential hypertension    Musculoskeletal chest pain.    Assessment and Plan     Ms. Landrus's chest discomfort clearly appears to be musculoskeletal.  She may benefit by placing topical diclofenac/Voltaren gel on her trigger points.  Otherwise her blood pressure and lipid profile appear to be under very good control. I have asked her to return for follow-up in 3 months.           Current Medications (including today's revisions)  ? biotin 5,000 mcg TbDi Dissolve 2 tablets by mouth twice daily.   ? buPROPion (WELLBUTRIN) 75 mg tablet Take 75 mg by mouth daily before lunch.   ? clonazePAM (KLONOPIN) 0.5 mg tablet Take 1.5 tablets by mouth twice daily.   ? clonazePAM (KLONOPIN) 1 mg tablet Take one tablet by mouth as Needed (For driving).   ? cyanocobalamin (vitamin B-12) 2,500 mcg tab Take 1 tablet by mouth daily with breakfast. ? diphenhydrAMINE (BENADRYL ALLERGY) 12.5 mg/5 mL oral solution Take 12.5 mg by mouth every 6 hours as needed.   ? divalproex ER (DEPAKOTE ER) 250 mg ER tablet Take 250 mg by mouth daily. Take with food.   ? duloxetine DR (CYMBALTA) 30 mg capsule Take 30 mg by mouth daily.   ? hydrOXYzine (ATARAX) 10 mg tablet TAKE 1-3 TABLETS BY MOUTH AT BEDTIME AS NEEDED FOR SLEEP   ? lamoTRIgine (LAMICTAL) 25 mg tablet   See Instructions, 60 Tab, take 1 pill every other night for 2 weeks, then 1 pill nightly for 2 weeks, then 2 pills nightly for 2 weeks, 0 Number of Refills, 0, Instructions Replace Required Details Route to Pharmacy Electronically, [facility] [id number],...   ? multivit, Ca, min-FA-soy isofl 400-60 mcg-mg tab Take 1 tablet by mouth twice daily.   ? nebivoloL (BYSTOLIC) 5 mg tablet Take one tablet by mouth daily.   ? nicotine(+) (NICOTROL) 10 mg inhaler Inhale one puff by mouth into the lungs as Needed. Puff by mouth as needed. May use 6-16 cartridges per day as needed for up to 6 months.   ? pantoprazole DR (PROTONIX) 40 mg tablet Take one tablet by mouth daily.   ? prochlorperazine maleate (COMPAZINE) 10 mg tablet Take one tablet by mouth every 6 hours as needed for Nausea. Take with tylenol and benadryl for acute migraine   ? rosuvastatin (CRESTOR) 20 mg tablet TAKE 1 TAB BY MOUTH DAILY.   ? scopolamine (TRANSDERM-SCOP) 1.5 mg 3 day patch Apply one patch to top of skin as directed every 72 hours.   ? Verapamil 40 mg tab TAKE 1 TABLET BY MOUTH TWICE A DAY   ? vitamins, multi w/minerals 9 mg iron-400 mcg tab Take 1 tablet by mouth daily with breakfast.

## 2019-06-24 ENCOUNTER — Encounter: Admit: 2019-06-24 | Discharge: 2019-06-24 | Payer: Commercial Managed Care - HMO

## 2019-06-24 ENCOUNTER — Ambulatory Visit: Admit: 2019-06-24 | Discharge: 2019-06-24 | Payer: Commercial Managed Care - HMO

## 2019-06-24 DIAGNOSIS — N3010 Interstitial cystitis (chronic) without hematuria: Secondary | ICD-10-CM

## 2019-06-24 DIAGNOSIS — J302 Other seasonal allergic rhinitis: Secondary | ICD-10-CM

## 2019-06-24 DIAGNOSIS — T753XXA Motion sickness, initial encounter: Secondary | ICD-10-CM

## 2019-06-24 DIAGNOSIS — M1990 Unspecified osteoarthritis, unspecified site: Secondary | ICD-10-CM

## 2019-06-24 DIAGNOSIS — R42 Dizziness and giddiness: Secondary | ICD-10-CM

## 2019-06-24 DIAGNOSIS — R0600 Dyspnea, unspecified: Secondary | ICD-10-CM

## 2019-06-24 DIAGNOSIS — G5603 Carpal tunnel syndrome, bilateral upper limbs: Secondary | ICD-10-CM

## 2019-06-24 DIAGNOSIS — F99 Mental disorder, not otherwise specified: Secondary | ICD-10-CM

## 2019-06-24 DIAGNOSIS — K859 Acute pancreatitis without necrosis or infection, unspecified: Secondary | ICD-10-CM

## 2019-06-24 DIAGNOSIS — E7800 Pure hypercholesterolemia, unspecified: Secondary | ICD-10-CM

## 2019-06-24 DIAGNOSIS — K219 Gastro-esophageal reflux disease without esophagitis: Secondary | ICD-10-CM

## 2019-06-24 DIAGNOSIS — R519 Generalized headaches: Secondary | ICD-10-CM

## 2019-06-24 DIAGNOSIS — M549 Dorsalgia, unspecified: Secondary | ICD-10-CM

## 2019-06-24 DIAGNOSIS — Z720 Tobacco use: Secondary | ICD-10-CM

## 2019-06-24 DIAGNOSIS — I10 Essential (primary) hypertension: Principal | ICD-10-CM

## 2019-06-24 DIAGNOSIS — G43909 Migraine, unspecified, not intractable, without status migrainosus: Secondary | ICD-10-CM

## 2019-06-24 DIAGNOSIS — G4733 Obstructive sleep apnea (adult) (pediatric): Secondary | ICD-10-CM

## 2019-06-24 NOTE — Progress Notes
Please see scanned note    Procedure Time Out Check List:  Prior to the start of the procedure, the following were confirmed:    Site Marking Verified: Not applicable  Patient Identity (name & date of birth): Yes  Procedure: Yes  Site: Yes  Body Part: both hands    The risks of the procedure, including infection, bleeding, and pain, were discussed with the patient.

## 2019-07-04 ENCOUNTER — Encounter: Admit: 2019-07-04 | Discharge: 2019-07-04 | Payer: Commercial Managed Care - HMO

## 2019-07-04 NOTE — Telephone Encounter
Received message from Quillian Quince with the sleep lab stating order was received on 06/16/2019 and [employer] requires authorization.  Quillian Quince is going to speak with Abagail Kitchens to confirm authorization has been submitted and will call back once confirmed.

## 2019-07-04 NOTE — Telephone Encounter
Received voicemail from pt stating she has not heard from anyone to schedule the sleep study that was ordered at the last visit.  Attempted to reach the sleep lab, but had to leave a voicemail.  Will try to reach again to check status.

## 2019-07-04 NOTE — Telephone Encounter
Attempted to reach sleep lab again, left voicemail requesting call back.

## 2019-07-06 ENCOUNTER — Encounter: Admit: 2019-07-06 | Discharge: 2019-07-06 | Payer: Commercial Managed Care - HMO

## 2019-07-06 DIAGNOSIS — Z1159 Encounter for screening for other viral diseases: Secondary | ICD-10-CM

## 2019-07-11 ENCOUNTER — Encounter: Admit: 2019-07-11 | Discharge: 2019-07-11 | Payer: Commercial Managed Care - HMO

## 2019-07-11 ENCOUNTER — Ambulatory Visit: Admit: 2019-07-11 | Discharge: 2019-07-11 | Payer: Commercial Managed Care - HMO

## 2019-07-11 DIAGNOSIS — G4733 Obstructive sleep apnea (adult) (pediatric): Secondary | ICD-10-CM

## 2019-07-13 ENCOUNTER — Encounter: Admit: 2019-07-13 | Discharge: 2019-07-13 | Payer: Commercial Managed Care - HMO

## 2019-07-13 NOTE — Telephone Encounter
Called Christina Garrison regarding her recent pulmonary function test results.  Her PFT's were normal, no evidence of obstructive lung disease.  Discussed that given these normal results, and her fairly benign CT chest, I do not believe she'd benefit from an inhaled therapy at this time.  She has a sleep study with CPAP titration scheduled in early December 2020, which I think would be the most beneficial intervention at this time.  Follow up is currently scheduled with me in February 2021.  Jatavia had no questions regarding the results, and will call with any questions or concerns.    Magdalen Spatz, MD  PGY-5 Pulmonary/Critical Care Fellow  Contact via [place]  Pager [phone]

## 2019-07-19 ENCOUNTER — Encounter: Admit: 2019-07-19 | Discharge: 2019-07-19 | Payer: Commercial Managed Care - HMO

## 2019-07-19 MED ORDER — PANTOPRAZOLE 40 MG PO TBEC
40 mg | ORAL_TABLET | Freq: Every day | ORAL | 2 refills | 90.00000 days | Status: DC
Start: 2019-07-19 — End: 2019-11-01

## 2019-07-19 NOTE — Telephone Encounter
Refill request received for pantoprazole  Last OV 02/10/2019  Last fill 11/01/2018 30 tabs w/11 refills    Routing to Glenice Bow, APRN for approval/refusal.    Of note: Called and clarified with pt as the last note mentioned that she was to be tapering off Protonix and only taking Pepcid. Pt reports that typically she takes Pepcid BID and only takes the Protonix on a PRN basis when the Pepcid is not working. She does not take both medications on the same day. Pt reports that her symptoms are worse when she lays down at night and when she wakes up in the morning.

## 2019-07-24 ENCOUNTER — Encounter: Admit: 2019-07-24 | Discharge: 2019-07-24 | Payer: Commercial Managed Care - HMO

## 2019-07-25 MED ORDER — PROCHLORPERAZINE MALEATE 10 MG PO TAB
ORAL_TABLET | Freq: Four times a day (QID) | ORAL | 5 refills | 8.00000 days | Status: DC | PRN
Start: 2019-07-25 — End: 2019-12-02

## 2019-08-02 ENCOUNTER — Encounter: Admit: 2019-08-02 | Discharge: 2019-08-03 | Payer: Commercial Managed Care - HMO

## 2019-08-02 DIAGNOSIS — Z1159 Encounter for screening for other viral diseases: Secondary | ICD-10-CM

## 2019-08-05 ENCOUNTER — Encounter: Admit: 2019-08-05 | Discharge: 2019-08-05 | Payer: Commercial Managed Care - HMO

## 2019-08-05 DIAGNOSIS — Z1231 Encounter for screening mammogram for malignant neoplasm of breast: Secondary | ICD-10-CM

## 2019-08-07 ENCOUNTER — Encounter: Admit: 2019-08-07 | Discharge: 2019-08-07 | Payer: Commercial Managed Care - HMO

## 2019-08-16 ENCOUNTER — Encounter: Admit: 2019-08-16 | Discharge: 2019-08-16 | Payer: Commercial Managed Care - HMO

## 2019-08-16 NOTE — Telephone Encounter
Pt left voicemail stating that results of sleep study have been released to MyChart, but pt does not understand results.    Routing to Dr. Jeris Penta to review and advise of recommendations.

## 2019-08-17 ENCOUNTER — Encounter: Admit: 2019-08-17 | Discharge: 2019-08-17 | Payer: Commercial Managed Care - HMO

## 2019-08-18 ENCOUNTER — Encounter: Admit: 2019-08-18 | Discharge: 2019-08-18 | Payer: Commercial Managed Care - HMO

## 2019-08-18 MED ORDER — ROSUVASTATIN 20 MG PO TAB
ORAL_TABLET | Freq: Every day | ORAL | 3 refills | 90.00000 days | Status: AC
Start: 2019-08-18 — End: ?

## 2019-09-20 ENCOUNTER — Encounter: Admit: 2019-09-20 | Discharge: 2019-09-20 | Payer: Commercial Managed Care - HMO

## 2019-09-20 DIAGNOSIS — F99 Mental disorder, not otherwise specified: Secondary | ICD-10-CM

## 2019-09-20 DIAGNOSIS — T753XXA Motion sickness, initial encounter: Secondary | ICD-10-CM

## 2019-09-20 DIAGNOSIS — K859 Acute pancreatitis without necrosis or infection, unspecified: Secondary | ICD-10-CM

## 2019-09-20 DIAGNOSIS — R42 Dizziness and giddiness: Secondary | ICD-10-CM

## 2019-09-20 DIAGNOSIS — I10 Essential (primary) hypertension: Secondary | ICD-10-CM

## 2019-09-20 DIAGNOSIS — E7800 Pure hypercholesterolemia, unspecified: Secondary | ICD-10-CM

## 2019-09-20 DIAGNOSIS — E785 Hyperlipidemia, unspecified: Secondary | ICD-10-CM

## 2019-09-20 DIAGNOSIS — K219 Gastro-esophageal reflux disease without esophagitis: Secondary | ICD-10-CM

## 2019-09-20 DIAGNOSIS — J302 Other seasonal allergic rhinitis: Secondary | ICD-10-CM

## 2019-09-20 DIAGNOSIS — R519 Generalized headaches: Secondary | ICD-10-CM

## 2019-09-20 DIAGNOSIS — M549 Dorsalgia, unspecified: Secondary | ICD-10-CM

## 2019-09-20 DIAGNOSIS — M1990 Unspecified osteoarthritis, unspecified site: Secondary | ICD-10-CM

## 2019-09-20 DIAGNOSIS — Z720 Tobacco use: Secondary | ICD-10-CM

## 2019-09-20 DIAGNOSIS — G4733 Obstructive sleep apnea (adult) (pediatric): Secondary | ICD-10-CM

## 2019-09-20 DIAGNOSIS — G43909 Migraine, unspecified, not intractable, without status migrainosus: Secondary | ICD-10-CM

## 2019-09-20 DIAGNOSIS — N3010 Interstitial cystitis (chronic) without hematuria: Secondary | ICD-10-CM

## 2019-09-20 DIAGNOSIS — R0600 Dyspnea, unspecified: Secondary | ICD-10-CM

## 2019-09-20 NOTE — Progress Notes
Date of Service: 09/20/2019    Christina Garrison is a 49 y.o. female.       HPI Christina Garrison has been followed for difficult to control hypertension.?Christina Garrison?reports that her blood pressures been under good control when she checks it at home. ?Her blood pressure typically runs in the range of 120/70-80?mmHg.  She reports that she has bilateral carpal tunnel syndrome worse in the left wrist than the right wrist.  Christina Garrison indicates that she is scheduled to see an orthopedic surgeon for her carpal tunnel syndrome. ?Her headaches and her depression are generally under reasonable good control. ?She is followed by gastroenterology for esophageal reflux and esophageal spasm.  She has occasional chest discomfort which is positional and appears to be clearly musculoskeletal.  It improves with diclofenac gel and ice and markedly worsens with heat.  She normally sleeps on her abdomen and her [place] usually sleeps on her back which probably sets her up for musculoskeletal discomfort.  In December 2020 she underwent a sleep study which was abnormal and she now uses CPAP. ?From a cardiovascular perspective, the patient has been doing well and reports no angina, congestive symptoms, palpitations, sensation of sustained forceful heart pounding, lightheadedness or syncope. Her exercise tolerance?is relatively stable and is limited by her lower back discomfort. ?She can walk for perhaps 10 or 15 minutes on the elliptical exercise machine before she needs to stop because of increased back discomfort.The patient reports no myalgias, bleeding abnormalities, medication or strokelike symptoms.  However, she previously had mylgias while taking pravastatin. 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 ?Her verapamil has been decreased from 40 mg twice daily to 20 mg in the morning and 40 mg in the evening because of bradycardia.   ? Historically regarding the treatment of her hypertension, Christina Garrison reports that an ACE inhibitor previously caused a cough and initially nebivolol was ineffective. At one time she was taking a thiazide diuretic, but this was stopped when it was thought to possibly have contributed to an episode of pancreatitis on October 30, 2010. Amlodipine 5 mg daily was started with good results. Nebivolol 5 mg daily was restarted and she has tolerated this without difficulty. Her past medical history is noteworthy for interstitial cystitis. Christina Garrison underwent laparoscopic cholecystectomy on 05/01/11 with resolution of her recurrent abdominal discomfort.?She reports chronic problems with recurrent sinus infections. 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 Christina Garrison reports that her surgery was complicated by fluid retention and pulmonary congestion.  Christina Garrison is a prior cigarette smoker and had smoked a half-a-pack per day for 23 years. In September 2013 she stopped smoking.??She has hyperlipidemia, but is not known to be diabetic. She has a family history for coronary disease. In 2010/03/03 a brother died suddenly at age 11. Her father is still living, but has coronary disease at age 14. Her mother had coronary disease. Her maternal grandfather died from coronary disease at age 3 and her maternal grandmother died from coronary disease, also age 25. One sister has lung cancer and 2 siblings have high blood pressure. No siblings have documented coronary disease, but as mentioned, her brother died suddenly in 03-03-2010 at age 41. ??She underwent open repair of supraumbilical hernia with mesh?on 09/23/2018  with excellent results.? On 06/03/2019 she received local pain injections for bilateral occipital neuralgia, myalgia, neuropathic pain, muscle spasm and myofascial pain.  It appears that on June 04, 2019 she was seen for chest discomfort in [place] and on June 09, 2019 she presented to [facility] hospital with chest discomfort.  There was no evidence for an acute coronary syndrome and stress testing was performed which showed no evidence of myocardial ischemia.  Her chest discomfort appeared to be clearly musculoskeletal.          Vitals:    09/20/19 1215   BP: 112/70   BP Source: Arm, Left Upper   Patient Position: Sitting   Pulse: 70   Temp: 36.3 ?C (97.3 ?F)   TempSrc: Oral   Weight: 99.8 kg (220 lb)   Height: 1.651 m (5' 5)   PainSc: Zero     Body mass index is 36.61 kg/m?Marland Kitchen     Past Medical History  Patient Active Problem List    Diagnosis Date Noted   ? Chest pain 06/09/2019   ? Bilateral arm weakness 05/27/2019     Describes neck manipulation at chiropractor starting in May, bilateral arm weakness/numbness/pain that began one week ago, and ongoing symptoms of bi-occipital, posterior neck pain moving anteriorly since June 2020     ? Bilateral occipital neuralgia 05/27/2019     Describes bilateral neck/occipital region pain that moves anteriorly. Tenderness to palpation on bilateral trap>occipital grooves.     Currently being seen by chronic anesthesiology for nerve blocks      ? Primary insomnia 03/18/2018   ? Depression 02/12/2018   ? Nightmare 11/26/2015   ? 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 Described as a jerking sensation lasting seconds.     MRI brain with and without contrast negative    Evaluation by neuro-otologist did not find any pathological findings    Continues to endorse some intermittent dizziness      ? Obstructive sleep apnea syndrome 05/21/2015     PSG on 03/16/15: AHI 8.9 events per hour,The REM AHI was 21.3, supine REM AHI 37.? The overall supine AHI was 16 and lateral AHI was 0. Oxyhemoglobin saturation nadir of 84% and Time below 88% was 12.8 minutes.   PLMS 0/hr   12/19/2015 oximetry on 2 Liters, <88% 6 minutes.  Increase to 3 liters and recheck.  Much better headaches. ? Obesity (BMI 30-39.9) 05/21/2015     Body mass index is 30.71 kg/(m^2).     ? Chronic insomnia 05/21/2015     - trouble with sleep maintenance   - Components of psychophysiologic insomnia and anxiety  - Has not used clonazepam for this, had leg cramps with lunesta  - poor sleep hygiene     Follows with Dr. Andria Meuse, currently non compliant with CPAP but does use supplemental oxygen overnight and being worked up for adequate use      ? Sleep disorder 11/09/2014   ? 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 Hyperlipidemia 08/26/2012   ? Vertigo 08/11/2012     Spells for years of intermittent dizziness, rarely with vertigo but now described more in terms of altered cognition., initially positionally provoked, now lasting for only seconds.  Occurs daily currently.  Initially with feeling of L ear fullness and pain.  Has been associated with headaches as well  Does have a h/o falling down stairs hitting her head and neck 20 years ago  MRI wo/w + IAC 10/2012 without significant abnormalities  Neuro-otology workup with likely central cause of dizziness (normal audiogram and mostly normal VEMPs, normal electronystagmography, no canal dehiscence on imaging)  Has undergone PT for vestibular and cervical causes  EEG (routine) normal    Current treatment: Clonazepam 1mg  qAM, qPM, Depakote 250 BID (for mood), Acupuncture (has had some response)    Prior trials: Prozac (SE), Effexor (suicidal thoughts), Topamax (speech difficulties), nortriptyline, Indomethacin (helped headache but GI SE), Magnesium/Riboflavin/coQ10, Propranolol (changed to Bystolic), Verapamil.  Occipital block with poor tolerance due to lasting pain at injection site     ? Tension headache 08/11/2012     frontal/L eye pressure since her 30s with conjunctival injection/photo/phono.  Able to work through this without any medication.  Sleeps in positions that provoke neck/back pain.  Significant greater occipital tenderness on exam Improved with treatment of OSA, and after occipital nerve blocks (though poorly tolerated as above)  History of frontal meningocele (resected), head trauma (fell down stairs)    Currently taking a swig of liquid tylenol x2 a week to help treat     ? Allergic rhinitis 03/29/2012     Pt is a 49 yo F who presents to ENT clinic for evaluation of her sinus allergies.  Patient states that she has a 1 year history of sinus pressure and congestion.  Usual she has congestion behind her eyes and in her cheeks.  + Facial pain.  Symptoms are worse in spring and summer.  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  Patient has taken claritin however it makes her dizzy. She is currently doing daily sinus rinses as well as flonase.  Currently she feels her sinuses are not that bad.    Patient has secondary complaint of dizziness for 1 year.  It is an off balance sensation when she is driving or bending over.  Lasts only momentarily. No sensation of the room spinning. No hearing loss, tinnitus. Some L ear fullness.     ? Chest pain of uncertain etiology 11/29/2010   ? Pancreatitis 11/21/2010     2/12 Hospitalization [facility]     ? Essential hypertension 11/21/2010         Review of Systems   Constitution: Negative.   HENT: Negative.    Eyes: Negative.    Cardiovascular: Negative.    Respiratory: Positive for sleep disturbances due to breathing (bipap).    Endocrine: Negative.    Hematologic/Lymphatic: Negative.    Skin: Negative.    Musculoskeletal: Positive for back pain and muscle cramps (legs).   Gastrointestinal: Negative.    Genitourinary: Negative.    Neurological: Positive for vertigo.   Psychiatric/Behavioral: Positive for depression. The patient is nervous/anxious.    Allergic/Immunologic: Negative.        Physical Exam  GENERAL: The patient is well developed, well nourished, resting comfortably and in no distress. ? HEENT: No abnormalities of the visible oro-nasopharynx, conjunctiva or sclera are noted.  NECK: There is no jugular venous distension. Carotids are palpable and without bruits. There is no thyroid enlargement.  Chest: Lung fields are clear to auscultation. There are no wheezes or crackles.    CV: There is a regular rhythm. The first and second heart sounds are normal. There are no murmurs, gallops or rubs. Her apical heart rate is 72?BPM.  ABD: The abdomen is soft and supple with normal bowel sounds. There is no hepatosplenomegaly, ascites, tenderness, masses or bruits.  Neuro: There are no focal motor defects. Ambulation is normal. Cognitive function  appears normal.  Ext:?There is no edema or evidence of deep vein thrombosis. Peripheral pulses are satisfactory. ?She is wearing a left wrist brace.  SKIN:?There are no rashes and no cellulitis  PSYCH:?The patient is calm, rationale and oriented    Cardiovascular Studies  A twelve-lead ECG obtained on June 09, 2019 reveals normal sinus rhythm with a heart rate of 60 bpm.  There is no evidence of myocardial ischemia or infarction.  ?  PET/CT stress study 06/10/2019:  Normal pharmacologic stress myocardial perfusion, flow quantification, and   function.   No significant coronary calcification.     Problems Addressed Today  Essential hypertension.  Hypercholesterolemia.  Assessment and Plan Christina Garrison appears stable from a cardiovascular perspective.  She has chronic occasional chest discomfort which clearly appears to be musculoskeletal.    It is not exertional, lasts for hours or days, is positional and improves with ice and diclofenac gel.  It markedly worsens with heat.  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   This likely contributes to her occasional musculoskeletal chest discomfort.  We discussed this in detail.  Otherwise her blood pressure and lipid profile appear to be under very good control. I have asked her to return for follow-up in 6 months.         Current Medications (including today's revisions)  ? biotin 5,000 mcg TbDi Dissolve 2 tablets by mouth twice daily.   ? buPROPion (WELLBUTRIN) 75 mg tablet Take 75 mg by mouth daily before lunch.   ? clonazePAM (KLONOPIN) 0.5 mg tablet Take 1.5 tablets by mouth twice daily.   ? clonazePAM (KLONOPIN) 1 mg tablet Take one tablet by mouth as Needed (For driving).   ? cyanocobalamin (vitamin B-12) 2,500 mcg tab Take 1 tablet by mouth daily with breakfast.   ? diphenhydrAMINE (BENADRYL ALLERGY) 12.5 mg/5 mL oral solution Take 12.5 mg by mouth every 6 hours as needed.   ? divalproex ER (DEPAKOTE ER) 250 mg ER tablet Take 250 mg by mouth daily. Take with food.   ? hydrOXYzine (ATARAX) 10 mg tablet TAKE 1-3 TABLETS BY MOUTH AT BEDTIME AS NEEDED FOR SLEEP   ? lamoTRIgine (LAMICTAL) 25 mg tablet   See Instructions, 60 Tab, take 1 pill every other night for 2 weeks, then 1 pill nightly for 2 weeks, then 2 pills nightly for 2 weeks, 0 Number of Refills, 0, Instructions Replace Required Details Route to Pharmacy Electronically, [phone],...   ? multivit, Ca, min-FA-soy isofl 400-60 mcg-mg tab Take 1 tablet by mouth twice daily.   ? nebivoloL (BYSTOLIC) 5 mg tablet Take one tablet by mouth daily. ? nicotine(+) (NICOTROL) 10 mg inhaler Inhale one puff by mouth into the lungs as Needed. Puff by mouth as needed. May use 6-16 cartridges per day as needed for up to 6 months.   ? pantoprazole DR (PROTONIX) 40 mg tablet Take one tablet by mouth daily.   ? prochlorperazine maleate (COMPAZINE) 10 mg tablet TAKE 1 TABLET EVERY 6 HOURS AS NEEDED FOR NAUSEA *TAKE WITH TYLENOL AND BENADRYL FOR MIGRAINE   ? rosuvastatin (CRESTOR) 20 mg tablet TAKE 1 TABLET BY MOUTH EVERY DAY   ? scopolamine (TRANSDERM-SCOP) 1.5 mg 3 day patch Apply one patch to top of skin as directed every 72 hours.   ? sertraline (ZOLOFT) 25 mg tablet Take 25 mg by mouth daily.   ? Verapamil 40 mg tab 20 mg in AM and 40 mg in PM   ?  vitamins, multi w/minerals 9 mg iron-400 mcg tab Take 1 tablet by mouth daily with breakfast.

## 2019-09-26 ENCOUNTER — Encounter: Admit: 2019-09-26 | Discharge: 2019-09-26 | Payer: Commercial Managed Care - HMO

## 2019-09-26 ENCOUNTER — Ambulatory Visit: Admit: 2019-09-26 | Discharge: 2019-09-27 | Payer: Commercial Managed Care - HMO

## 2019-09-26 ENCOUNTER — Ambulatory Visit: Admit: 2019-09-26 | Discharge: 2019-09-26 | Payer: Commercial Managed Care - HMO

## 2019-09-26 DIAGNOSIS — G5602 Carpal tunnel syndrome, left upper limb: Secondary | ICD-10-CM

## 2019-09-26 DIAGNOSIS — I10 Essential (primary) hypertension: Secondary | ICD-10-CM

## 2019-09-26 DIAGNOSIS — Z720 Tobacco use: Secondary | ICD-10-CM

## 2019-09-26 DIAGNOSIS — E7800 Pure hypercholesterolemia, unspecified: Secondary | ICD-10-CM

## 2019-09-26 DIAGNOSIS — R519 Generalized headaches: Secondary | ICD-10-CM

## 2019-09-26 DIAGNOSIS — J302 Other seasonal allergic rhinitis: Secondary | ICD-10-CM

## 2019-09-26 DIAGNOSIS — K859 Acute pancreatitis without necrosis or infection, unspecified: Secondary | ICD-10-CM

## 2019-09-26 DIAGNOSIS — M1990 Unspecified osteoarthritis, unspecified site: Secondary | ICD-10-CM

## 2019-09-26 DIAGNOSIS — G43909 Migraine, unspecified, not intractable, without status migrainosus: Secondary | ICD-10-CM

## 2019-09-26 DIAGNOSIS — R0600 Dyspnea, unspecified: Secondary | ICD-10-CM

## 2019-09-26 DIAGNOSIS — K219 Gastro-esophageal reflux disease without esophagitis: Secondary | ICD-10-CM

## 2019-09-26 DIAGNOSIS — R42 Dizziness and giddiness: Secondary | ICD-10-CM

## 2019-09-26 DIAGNOSIS — G4733 Obstructive sleep apnea (adult) (pediatric): Secondary | ICD-10-CM

## 2019-09-26 DIAGNOSIS — T753XXA Motion sickness, initial encounter: Secondary | ICD-10-CM

## 2019-09-26 DIAGNOSIS — M549 Dorsalgia, unspecified: Secondary | ICD-10-CM

## 2019-09-26 DIAGNOSIS — Z20822 Encounter for screening laboratory testing for COVID-19 virus in asymptomatic patient: Secondary | ICD-10-CM

## 2019-09-26 DIAGNOSIS — N3010 Interstitial cystitis (chronic) without hematuria: Secondary | ICD-10-CM

## 2019-09-26 DIAGNOSIS — F99 Mental disorder, not otherwise specified: Secondary | ICD-10-CM

## 2019-09-26 NOTE — Progress Notes
Subjective:       History of Present Illness  Christina Garrison is a 49 y.o. female.  RHD female who presents today with about 2 years of numbness and tingling L>R.  Got worse over the last 4 months. 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  She has been splinting for 3 months with little to no relief. EMG showed left carpal tunnel syndrome.  She wakes up in the middle of the night with numbness and tingling, all fingers involved.  She does not have baseline numbness of the fingers.  She has had no surgeries or injuries on the left hand/wrist. Referred by Dr. Genevie Cheshire.     Review of Systems   Constitutional: Negative.    HENT: Negative.    Eyes: Negative.    Respiratory: Negative.    Cardiovascular: Negative.    Gastrointestinal: Negative.    Endocrine: Negative.    Genitourinary: Negative.    Musculoskeletal: Positive for arthralgias (bilateral wrist pain and heaviness).   Skin: Negative.    Allergic/Immunologic: Negative.    Neurological: Positive for numbness (bilateral hands).   Hematological: Negative.    Psychiatric/Behavioral: Negative.      Medical History:   Diagnosis Date   ? Arthritis    ? Back pain    ? Dyspnea     per pt-post op 2015.  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 Generalized headaches    ? GERD (gastroesophageal reflux disease)    ? High cholesterol    ? HTN (hypertension)    ? Hypertension 11/21/2010   ? Interstitial cystitis    ? Migraines    ? Motion sickness    ? OSA (obstructive sleep apnea)     uses NC w CA   ? Pancreatitis 11/21/2010   ? Psychiatric illness     Bipolar; Anxiety; Depression   ? Seasonal allergic reaction    ? Tobacco abuse    ? Vertigo      Surgical History:   Procedure Laterality Date   ? SINUS SURGERY  2004   ? PR LAPS GSTRC RSTRICTIV PX LONGITUDINAL GASTRECTOMY  03/2014    per pt code blue the day after-I couldn't breath- I had fluid on my lungs ([place])   ? EGD N/A 08/23/2018 Performed by Vertell Novak, MD at [facility] OR   ? COLONOSCOPY DIAGNOSTIC WITH SPECIMEN COLLECTION BY BRUSHING/ WASHING - FLEXIBLE N/A 08/23/2018    Performed by Vertell Novak, MD at [facility] OR   ? OPEN REPAIR SUPRAUMBILICAL HERNIA WITH MESH N/A 09/23/2018    Performed by Lelan Pons., MD at [facility] OR   ? 24HR pH w/ Impedance -OFF PPIs N/A 01/27/2019    Performed by Tommie Sams, MD at [facility] ENDO   ? ESOPHAGEAL MOTILITY STUDY N/A 01/27/2019    Performed by Tommie Sams, MD at [facility]   ? ELECTROCARDIOGRAM     ? HX APPENDECTOMY     ? HX CHOLECYSTECTOMY     ? HX PARTIAL HYSTERECTOMY       Family History   Problem Relation Age of Onset   ? Coronary Artery Disease Mother    ? Allergy-severe Mother    ? Diabetes Mother    ? Heart Attack Mother    ? High Cholesterol Mother    ? Hypertension Mother    ? Asthma Mother    ? Coronary Artery Disease  Father    ? Heart Attack Father    ? High Cholesterol Father    ? Hypertension Father    ? Hypertension Brother    ? Diabetes Sister    ? High Cholesterol Sister    ? Hypertension Sister    ? Diabetes Brother    ? Tumor Sister    ? Cancer Sister 52        Brain, deceased   ? Tumor Sister 37        2 in Brain   ? Heart Disease Sister 23     Social History     Socioeconomic History   ? Marital status: Married     Spouse name: Not on file   ? Number of children: Not on file   ? Years of education: Not on file   ? Highest education level: Not on file   Occupational History   ? Not on file   Tobacco Use   ? Smoking status: Former Smoker     Packs/day: 1.50     Years: 30.00     Pack years: 45.00     Types: Cigarettes     Quit date: 05/02/2012     Years since quitting: 7.4   ? Smokeless tobacco: Never Used   ? Tobacco comment: Currently Vapes   Substance and Sexual Activity   ? Alcohol use: No     Alcohol/week: 0.0 standard drinks   ? Drug use: No   ? Sexual activity: Not Currently     Birth control/protection: Surgical   Other Topics Concern   ? Not on file   Social History Narrative ? Not on file     Objective:         ? biotin 5,000 mcg TbDi Dissolve 2 tablets by mouth twice daily.   ? buPROPion (WELLBUTRIN) 75 mg tablet Take 75 mg by mouth daily before lunch.   ? clonazePAM (KLONOPIN) 0.5 mg tablet Take 1.5 tablets by mouth twice daily.   ? clonazePAM (KLONOPIN) 1 mg tablet Take one tablet by mouth as Needed (For driving).   ? cyanocobalamin (vitamin B-12) 2,500 mcg tab Take 1 tablet by mouth daily with breakfast.   ? diphenhydrAMINE (BENADRYL ALLERGY) 12.5 mg/5 mL oral solution Take 12.5 mg by mouth every 6 hours as needed.   ? divalproex ER (DEPAKOTE ER) 250 mg ER tablet Take 250 mg by mouth daily. Take with food.   ? hydrOXYzine (ATARAX) 10 mg tablet TAKE 1-3 TABLETS BY MOUTH AT BEDTIME AS NEEDED FOR SLEEP   ? lamoTRIgine (LAMICTAL) 25 mg tablet   See Instructions, 60 Tab, take 1 pill every other night for 2 weeks, then 1 pill nightly for 2 weeks, then 2 pills nightly for 2 weeks, 0 Number of Refills, 0, Instructions Replace Required Details [phone],...   ? multivit, Ca, min-FA-soy isofl 400-60 mcg-mg tab Take 1 tablet by mouth twice daily.   ? nebivoloL (BYSTOLIC) 5 mg tablet Take one tablet by mouth daily.   ? nicotine(+) (NICOTROL) 10 mg inhaler Inhale one puff by mouth into the lungs as Needed. Puff by mouth as needed. May use 6-16 cartridges per day as needed for up to 6 months.   ? pantoprazole DR (PROTONIX) 40 mg tablet Take one tablet by mouth daily.   ? prochlorperazine maleate (COMPAZINE) 10 mg tablet TAKE 1 TABLET EVERY 6 HOURS AS NEEDED FOR NAUSEA *TAKE WITH TYLENOL AND BENADRYL FOR MIGRAINE   ? rosuvastatin (  CRESTOR) 20 mg tablet TAKE 1 TABLET BY MOUTH EVERY DAY   ? scopolamine (TRANSDERM-SCOP) 1.5 mg 3 day patch Apply one patch to top of skin as directed every 72 hours.   ? sertraline (ZOLOFT) 25 mg tablet Take 25 mg by mouth daily.   ? Verapamil 40 mg tab 20 mg in AM and 40 mg in PM ? vitamins, multi w/minerals 9 mg iron-400 mcg tab Take 1 tablet by mouth daily with breakfast.     Vitals:    09/26/19 1047   BP: 115/80   BP Source: Arm, Left Upper   Patient Position: Sitting   Pulse: 73   Weight: 97.1 kg (214 lb)   Height: 165.1 cm (65)   PainSc: Zero     Body mass index is 35.61 kg/m?Marland Kitchen     Physical Exam  Constitutional:       Appearance: Normal appearance.   HENT:      Head: Normocephalic.      Nose: Nose normal.   Eyes:      Extraocular Movements: Extraocular movements intact.   Cardiovascular:      Rate and Rhythm: Normal rate.      Pulses: Normal pulses.   Pulmonary:      Effort: Pulmonary effort is normal.   Musculoskeletal:      Comments: Left hand positive tinel at the elbow and at the wrist over the median nerve.  Positive durkan's.  Positive phalen.  Mild thenar wasting.  SILT in all fingers.  Full ROM.   Skin:     General: Skin is warm and dry.      Capillary Refill: Capillary refill takes less than 2 seconds.   Neurological:      General: No focal deficit present.      Mental Status: She is alert and oriented to person, place, and time.   Psychiatric:         Mood and Affect: Mood normal.         Behavior: Behavior normal.         Thought Content: Thought content normal.         Judgment: Judgment normal.          Assessment and Plan:  Patient presents today with L>R carpal tunnel syndrome. The pathophysiology of the disease process and treatment options were explained. Is a good candidate for surgical release after failing of conservative treatment.  Will get her scheduled for main campus.  Carpal Tunnel Syndrome  Increased pressure on the median nerve at the wrist  Symptoms may include: pain, numbness, tingling, burning, itching, weakness, swelling    Non-operative Treatment Options:  -night wrist brace/splinting  -nerve conduction study (NCS/EMG)  -steroid injection    Surgical Options  -Endoscopic carpal tunnel release  -Open carpal tunnel release     - Outpatient procedure - Sutures remain in place for 10 to 14 days.  No splints are used. Range of motion post operatively is encouraged.    - Palm tenderness may remain for approximately 3 months   - May have some weakness, difficulty opening jars, etc for approximately 3 months   - Post operative limitations include no lifting greater then 5 lbs with operated extremity for 2 weeks, no lifting greater then 10 lbs with operated extremity for 2 more weeks.   -At 4 weeks after surgery, restrictions are lifted but tenderness in the palm with some use may remain.    -Night and positional symptoms resolve relatively quickly.  Baseline numbness  and weakness can take a year or more to improve or may not improve at all.     Risks of surgery:   Bleeding, infection, pain, severe chronic pain, stiffness, nerve injury that may result in temporary or permanent weakness and or numbness, incomplete release, incomplete relief of symptoms, hypertrophic/poor scaring, and need for more surgery.

## 2019-09-26 NOTE — Telephone Encounter
Called and spoke with Altheria re: surgery scheduling.  She elected for 2/19 at the main hospital.  COVID test scheduled at the main hospital location for 2/17 at 4:50 PM.  Post-Op scheduled for 3/8 in [place].  Dates times and locations e-mailed to Claudette with pre-op instructions.

## 2019-09-27 ENCOUNTER — Encounter: Admit: 2019-09-27 | Discharge: 2019-09-27 | Payer: Commercial Managed Care - HMO

## 2019-09-27 DIAGNOSIS — I10 Essential (primary) hypertension: Principal | ICD-10-CM

## 2019-09-27 DIAGNOSIS — K859 Acute pancreatitis without necrosis or infection, unspecified: Secondary | ICD-10-CM

## 2019-09-27 DIAGNOSIS — N3010 Interstitial cystitis (chronic) without hematuria: Secondary | ICD-10-CM

## 2019-09-27 DIAGNOSIS — T753XXA Motion sickness, initial encounter: Secondary | ICD-10-CM

## 2019-09-27 DIAGNOSIS — R42 Dizziness and giddiness: Secondary | ICD-10-CM

## 2019-09-27 DIAGNOSIS — J302 Other seasonal allergic rhinitis: Secondary | ICD-10-CM

## 2019-09-27 DIAGNOSIS — R519 Generalized headaches: Secondary | ICD-10-CM

## 2019-09-27 DIAGNOSIS — F99 Mental disorder, not otherwise specified: Secondary | ICD-10-CM

## 2019-09-27 DIAGNOSIS — M1990 Unspecified osteoarthritis, unspecified site: Secondary | ICD-10-CM

## 2019-09-27 DIAGNOSIS — G4733 Obstructive sleep apnea (adult) (pediatric): Secondary | ICD-10-CM

## 2019-09-27 DIAGNOSIS — G43909 Migraine, unspecified, not intractable, without status migrainosus: Secondary | ICD-10-CM

## 2019-09-27 DIAGNOSIS — Z720 Tobacco use: Secondary | ICD-10-CM

## 2019-09-27 DIAGNOSIS — R0600 Dyspnea, unspecified: Secondary | ICD-10-CM

## 2019-09-27 DIAGNOSIS — G5602 Carpal tunnel syndrome, left upper limb: Secondary | ICD-10-CM

## 2019-09-27 DIAGNOSIS — E7800 Pure hypercholesterolemia, unspecified: Secondary | ICD-10-CM

## 2019-09-27 DIAGNOSIS — M549 Dorsalgia, unspecified: Secondary | ICD-10-CM

## 2019-09-27 DIAGNOSIS — K219 Gastro-esophageal reflux disease without esophagitis: Secondary | ICD-10-CM

## 2019-09-27 NOTE — Telephone Encounter
Christina Garrison called and left a message this afternoon with a concern over stopping her vitamins for 14 days prior to surgery.  She states she is a gastric sleeve patient and relies on those vitamins daily.  She is wondering if the 14 days is a must for her MVI or if it could be any less.      Contacted [name] with [facility] to check.

## 2019-09-28 ENCOUNTER — Encounter: Admit: 2019-09-28 | Discharge: 2019-09-28 | Payer: Commercial Managed Care - HMO

## 2019-09-28 NOTE — Telephone Encounter
Received CPAP download from [place]. Emailed to Dr. Jeris Penta to review and emailed to Princella Ion, MA CCC-SLP to be scanned to pt's chart.

## 2019-10-19 ENCOUNTER — Encounter: Admit: 2019-10-19 | Discharge: 2019-10-20 | Payer: Commercial Managed Care - HMO

## 2019-10-19 DIAGNOSIS — Z20822 Encounter for screening laboratory testing for COVID-19 virus in asymptomatic patient: Secondary | ICD-10-CM

## 2019-10-20 ENCOUNTER — Encounter: Admit: 2019-10-20 | Discharge: 2019-10-20 | Payer: Commercial Managed Care - HMO

## 2019-10-21 ENCOUNTER — Encounter: Admit: 2019-10-21 | Discharge: 2019-10-21 | Payer: PRIVATE HEALTH INSURANCE

## 2019-10-21 ENCOUNTER — Ambulatory Visit: Admit: 2019-10-21 | Discharge: 2019-10-21 | Payer: PRIVATE HEALTH INSURANCE

## 2019-10-21 DIAGNOSIS — R519 Generalized headaches: Secondary | ICD-10-CM

## 2019-10-21 DIAGNOSIS — M1990 Unspecified osteoarthritis, unspecified site: Secondary | ICD-10-CM

## 2019-10-21 DIAGNOSIS — K219 Gastro-esophageal reflux disease without esophagitis: Secondary | ICD-10-CM

## 2019-10-21 DIAGNOSIS — J302 Other seasonal allergic rhinitis: Secondary | ICD-10-CM

## 2019-10-21 DIAGNOSIS — N3010 Interstitial cystitis (chronic) without hematuria: Secondary | ICD-10-CM

## 2019-10-21 DIAGNOSIS — E7800 Pure hypercholesterolemia, unspecified: Secondary | ICD-10-CM

## 2019-10-21 DIAGNOSIS — R42 Dizziness and giddiness: Secondary | ICD-10-CM

## 2019-10-21 DIAGNOSIS — F99 Mental disorder, not otherwise specified: Secondary | ICD-10-CM

## 2019-10-21 DIAGNOSIS — K859 Acute pancreatitis without necrosis or infection, unspecified: Secondary | ICD-10-CM

## 2019-10-21 DIAGNOSIS — Z720 Tobacco use: Secondary | ICD-10-CM

## 2019-10-21 DIAGNOSIS — M549 Dorsalgia, unspecified: Secondary | ICD-10-CM

## 2019-10-21 DIAGNOSIS — G43909 Migraine, unspecified, not intractable, without status migrainosus: Secondary | ICD-10-CM

## 2019-10-21 DIAGNOSIS — I10 Essential (primary) hypertension: Secondary | ICD-10-CM

## 2019-10-21 DIAGNOSIS — R0600 Dyspnea, unspecified: Secondary | ICD-10-CM

## 2019-10-21 DIAGNOSIS — T753XXA Motion sickness, initial encounter: Secondary | ICD-10-CM

## 2019-10-21 DIAGNOSIS — G4733 Obstructive sleep apnea (adult) (pediatric): Secondary | ICD-10-CM

## 2019-10-21 MED ORDER — LIDOCAINE (PF) 10 MG/ML (1 %) IJ SOLN
.1-2 mL | INTRAMUSCULAR | 0 refills | Status: DC | PRN
Start: 2019-10-21 — End: 2019-10-21

## 2019-10-21 MED ORDER — BUPIVACAINE 0.5 % (5 MG/ML) IJ SOLN
0 refills | Status: CP
Start: 2019-10-21 — End: ?
  Administered 2019-10-21: 13:00:00 15 mL

## 2019-10-21 MED ORDER — ONDANSETRON HCL (PF) 4 MG/2 ML IJ SOLN
4 mg | Freq: Once | INTRAVENOUS | 0 refills | Status: DC | PRN
Start: 2019-10-21 — End: 2019-10-21

## 2019-10-21 MED ORDER — PROMETHAZINE 25 MG/ML IJ SOLN
6.25 mg | INTRAVENOUS | 0 refills | Status: DC | PRN
Start: 2019-10-21 — End: 2019-10-21
  Administered 2019-10-21: 15:00:00 6.25 mg via INTRAVENOUS

## 2019-10-21 MED ORDER — OXYCODONE 5 MG PO TAB
5 mg | ORAL_TABLET | ORAL | 0 refills | 6.00000 days | Status: DC | PRN
Start: 2019-10-21 — End: 2019-11-24
  Filled 2019-10-21: qty 12, 2d supply, fill #1

## 2019-10-21 MED ORDER — LIDOCAINE (PF) 20 MG/ML (2 %) IJ SOLN
0 refills | Status: CP
Start: 2019-10-21 — End: ?
  Administered 2019-10-21: 13:00:00 15 mL

## 2019-10-21 MED ORDER — LIDOCAINE (PF) 10 MG/ML (1 %) IJ SOLN
SUBCUTANEOUS | 0 refills | Status: CP
Start: 2019-10-21 — End: ?
  Administered 2019-10-21: 13:00:00 3 mL via SUBCUTANEOUS

## 2019-10-21 MED ORDER — ONDANSETRON HCL (PF) 4 MG/2 ML IJ SOLN
INTRAVENOUS | 0 refills | Status: DC
Start: 2019-10-21 — End: 2019-10-21
  Administered 2019-10-21: 14:00:00 4 mg via INTRAVENOUS

## 2019-10-21 MED ORDER — LACTATED RINGERS IV SOLP
1000 mL | INTRAVENOUS | 0 refills | Status: DC
Start: 2019-10-21 — End: 2019-10-21

## 2019-10-21 MED ORDER — EPHEDRINE SULFATE 50 MG/5ML SYR (10 MG/ML) (AN)(OSM)
0 refills | Status: DC
Start: 2019-10-21 — End: 2019-10-21
  Administered 2019-10-21 (×2): 5 mg via INTRAVENOUS

## 2019-10-21 MED ORDER — MIDAZOLAM 1 MG/ML IJ SOLN
INTRAVENOUS | 0 refills | Status: DC
Start: 2019-10-21 — End: 2019-10-21
  Administered 2019-10-21: 13:00:00 2 mg via INTRAVENOUS

## 2019-10-21 MED ORDER — PROPOFOL INJ 10 MG/ML IV VIAL
0 refills | Status: DC
Start: 2019-10-21 — End: 2019-10-21
  Administered 2019-10-21: 14:00:00 150 mg via INTRAVENOUS

## 2019-10-21 MED ORDER — DEXTRAN 70-HYPROMELLOSE (PF) 0.1-0.3 % OP DPET
0 refills | Status: DC
Start: 2019-10-21 — End: 2019-10-21
  Administered 2019-10-21: 14:00:00 2 [drp] via OPHTHALMIC

## 2019-10-21 MED ORDER — CEFAZOLIN 1 GRAM IJ SOLR
0 refills | Status: DC
Start: 2019-10-21 — End: 2019-10-21
  Administered 2019-10-21: 14:00:00 2 g via INTRAVENOUS

## 2019-10-21 MED ORDER — LIDOCAINE (PF) 200 MG/10 ML (2 %) IJ SYRG
0 refills | Status: DC
Start: 2019-10-21 — End: 2019-10-21
  Administered 2019-10-21: 14:00:00 40 mg via INTRAVENOUS

## 2019-10-21 MED ORDER — FENTANYL CITRATE (PF) 50 MCG/ML IJ SOLN
0 refills | Status: DC
Start: 2019-10-21 — End: 2019-10-21
  Administered 2019-10-21: 14:00:00 50 ug via INTRAVENOUS
  Administered 2019-10-21: 13:00:00 25 ug via INTRAVENOUS

## 2019-10-21 MED ORDER — FENTANYL CITRATE (PF) 50 MCG/ML IJ SOLN
50 ug | INTRAVENOUS | 0 refills | Status: DC | PRN
Start: 2019-10-21 — End: 2019-10-21

## 2019-10-21 MED ADMIN — LACTATED RINGERS IV SOLP [4318]: 1000 mL | INTRAVENOUS | @ 12:00:00 | Stop: 2019-10-21 | NDC 00338011704

## 2019-10-26 ENCOUNTER — Ambulatory Visit: Admit: 2019-10-26 | Discharge: 2019-10-27 | Payer: Commercial Managed Care - HMO

## 2019-10-26 ENCOUNTER — Encounter: Admit: 2019-10-26 | Discharge: 2019-10-26 | Payer: PRIVATE HEALTH INSURANCE

## 2019-10-26 DIAGNOSIS — J302 Other seasonal allergic rhinitis: Secondary | ICD-10-CM

## 2019-10-26 DIAGNOSIS — T753XXA Motion sickness, initial encounter: Secondary | ICD-10-CM

## 2019-10-26 DIAGNOSIS — K219 Gastro-esophageal reflux disease without esophagitis: Secondary | ICD-10-CM

## 2019-10-26 DIAGNOSIS — M1990 Unspecified osteoarthritis, unspecified site: Secondary | ICD-10-CM

## 2019-10-26 DIAGNOSIS — R42 Dizziness and giddiness: Secondary | ICD-10-CM

## 2019-10-26 DIAGNOSIS — R519 Generalized headaches: Secondary | ICD-10-CM

## 2019-10-26 DIAGNOSIS — I10 Essential (primary) hypertension: Secondary | ICD-10-CM

## 2019-10-26 DIAGNOSIS — G4733 Obstructive sleep apnea (adult) (pediatric): Secondary | ICD-10-CM

## 2019-10-26 DIAGNOSIS — K859 Acute pancreatitis without necrosis or infection, unspecified: Secondary | ICD-10-CM

## 2019-10-26 DIAGNOSIS — N3010 Interstitial cystitis (chronic) without hematuria: Secondary | ICD-10-CM

## 2019-10-26 DIAGNOSIS — R0600 Dyspnea, unspecified: Secondary | ICD-10-CM

## 2019-10-26 DIAGNOSIS — F99 Mental disorder, not otherwise specified: Secondary | ICD-10-CM

## 2019-10-26 DIAGNOSIS — E7800 Pure hypercholesterolemia, unspecified: Secondary | ICD-10-CM

## 2019-10-26 DIAGNOSIS — Z720 Tobacco use: Secondary | ICD-10-CM

## 2019-10-26 DIAGNOSIS — M549 Dorsalgia, unspecified: Secondary | ICD-10-CM

## 2019-10-26 DIAGNOSIS — G43909 Migraine, unspecified, not intractable, without status migrainosus: Secondary | ICD-10-CM

## 2019-10-26 MED ORDER — MELATONIN 3 MG PO TAB
3 mg | Freq: Every evening | ORAL | 3 refills | 28.00000 days | Status: AC
Start: 2019-10-26 — End: ?

## 2019-10-26 MED ORDER — NICOTINE 10 MG IN CRTG
1 | RESPIRATORY_TRACT | 11 refills | Status: AC | PRN
Start: 2019-10-26 — End: ?

## 2019-10-27 DIAGNOSIS — Z720 Tobacco use: Secondary | ICD-10-CM

## 2019-10-27 DIAGNOSIS — G4733 Obstructive sleep apnea (adult) (pediatric): Secondary | ICD-10-CM

## 2019-10-28 ENCOUNTER — Encounter: Admit: 2019-10-28 | Discharge: 2019-10-28 | Payer: PRIVATE HEALTH INSURANCE

## 2019-10-28 NOTE — Telephone Encounter
-----   Message from Estanislado Spire, RN sent at 10/27/2019  4:10 PM CST -----  Can you please fax the office note from 10/26/2019 for CPAP compliance to her DME.    Thanks!

## 2019-10-28 NOTE — Telephone Encounter
Office notes from 10/26/2019 faxed to [facility] for patient's CPAP compliance.     Princella Ion, MA

## 2019-11-01 ENCOUNTER — Encounter: Admit: 2019-11-01 | Discharge: 2019-11-01 | Payer: PRIVATE HEALTH INSURANCE

## 2019-11-01 MED ORDER — PANTOPRAZOLE 40 MG PO TBEC
40 mg | ORAL_TABLET | Freq: Every day | ORAL | 2 refills | 90.00000 days | Status: DC
Start: 2019-11-01 — End: 2020-02-01

## 2019-11-01 NOTE — Telephone Encounter
Refill request received for pantoprazole  Last OV 02/10/2019  Last fill  07/19/2019 30 tabs w/2 refills    Routing to Glenice Bow, APRN for approval/refusal

## 2019-11-07 ENCOUNTER — Ambulatory Visit: Admit: 2019-11-07 | Discharge: 2019-11-07 | Payer: Commercial Managed Care - HMO

## 2019-11-07 ENCOUNTER — Encounter: Admit: 2019-11-07 | Discharge: 2019-11-07 | Payer: PRIVATE HEALTH INSURANCE

## 2019-11-07 DIAGNOSIS — R42 Dizziness and giddiness: Secondary | ICD-10-CM

## 2019-11-07 DIAGNOSIS — Z720 Tobacco use: Secondary | ICD-10-CM

## 2019-11-07 DIAGNOSIS — G43909 Migraine, unspecified, not intractable, without status migrainosus: Secondary | ICD-10-CM

## 2019-11-07 DIAGNOSIS — N3010 Interstitial cystitis (chronic) without hematuria: Secondary | ICD-10-CM

## 2019-11-07 DIAGNOSIS — G4733 Obstructive sleep apnea (adult) (pediatric): Secondary | ICD-10-CM

## 2019-11-07 DIAGNOSIS — I10 Essential (primary) hypertension: Secondary | ICD-10-CM

## 2019-11-07 DIAGNOSIS — R0600 Dyspnea, unspecified: Secondary | ICD-10-CM

## 2019-11-07 DIAGNOSIS — F99 Mental disorder, not otherwise specified: Secondary | ICD-10-CM

## 2019-11-07 DIAGNOSIS — M549 Dorsalgia, unspecified: Secondary | ICD-10-CM

## 2019-11-07 DIAGNOSIS — J302 Other seasonal allergic rhinitis: Secondary | ICD-10-CM

## 2019-11-07 DIAGNOSIS — M1990 Unspecified osteoarthritis, unspecified site: Secondary | ICD-10-CM

## 2019-11-07 DIAGNOSIS — K859 Acute pancreatitis without necrosis or infection, unspecified: Secondary | ICD-10-CM

## 2019-11-07 DIAGNOSIS — K219 Gastro-esophageal reflux disease without esophagitis: Secondary | ICD-10-CM

## 2019-11-07 DIAGNOSIS — E7800 Pure hypercholesterolemia, unspecified: Secondary | ICD-10-CM

## 2019-11-07 DIAGNOSIS — T753XXA Motion sickness, initial encounter: Secondary | ICD-10-CM

## 2019-11-07 DIAGNOSIS — G5602 Carpal tunnel syndrome, left upper limb: Secondary | ICD-10-CM

## 2019-11-07 DIAGNOSIS — R519 Generalized headaches: Secondary | ICD-10-CM

## 2019-11-07 NOTE — Progress Notes
A little over 2 weeks status post left endoscopic carpal tunnel release.  Overall she is doing well.  She reports some intermittent/occasional shooting pains that radiate up to the elbow but otherwise she is doing well.  She reports some hand stiffness.  Denies nocturnal paresthesias.  She does complain of making face in both hands while sleeping.  This is a problem that has been ongoing for a number of years.  She is interested in having the right side released.  Her suture came out when she was removing the Steri-Strips.  She is interested in scheduling right-sided release.    Exam: Incision is healing appropriately.  There is minimal scabbing.  The pullout suture has been removed.  Strong palmar thumb abduction.  Sensation to light touch in all digits is present.  Full digit range of motion.  Able to make a complete fist and fully extend all digits.    Assessment and plan: Doing well status post left endoscopic carpal tunnel release.  She can begin scar massage in 1 week.  She would like to schedule right-sided release.  This can be done in approximately 1 month's time.  With regards to the discomfort she and feels intermittently, she can continue to wear her night splint for another couple of weeks to see if this can help with some of the inflammation and swelling.  She is happy with the plan.  All of her questions were answered.    Schedule:  Right endoscopic carpal tunnel release.  CPT: [phone].  30 minutes.  General anesthetic, outpatient procedure.  Schedule in approximately 1 month's time.

## 2019-11-08 ENCOUNTER — Ambulatory Visit: Admit: 2019-11-08 | Discharge: 2019-11-08 | Payer: PRIVATE HEALTH INSURANCE

## 2019-11-08 ENCOUNTER — Encounter: Admit: 2019-11-08 | Discharge: 2019-11-08 | Payer: PRIVATE HEALTH INSURANCE

## 2019-11-24 ENCOUNTER — Ambulatory Visit: Admit: 2019-11-24 | Discharge: 2019-11-25 | Payer: Commercial Managed Care - HMO

## 2019-11-24 ENCOUNTER — Encounter: Admit: 2019-11-24 | Discharge: 2019-11-24 | Payer: PRIVATE HEALTH INSURANCE

## 2019-11-24 DIAGNOSIS — T753XXA Motion sickness, initial encounter: Secondary | ICD-10-CM

## 2019-11-24 DIAGNOSIS — M1990 Unspecified osteoarthritis, unspecified site: Secondary | ICD-10-CM

## 2019-11-24 DIAGNOSIS — F17200 Nicotine dependence, unspecified, uncomplicated: Secondary | ICD-10-CM

## 2019-11-24 DIAGNOSIS — G4733 Obstructive sleep apnea (adult) (pediatric): Secondary | ICD-10-CM

## 2019-11-24 DIAGNOSIS — R42 Dizziness and giddiness: Secondary | ICD-10-CM

## 2019-11-24 DIAGNOSIS — R519 Generalized headaches: Secondary | ICD-10-CM

## 2019-11-24 DIAGNOSIS — M549 Dorsalgia, unspecified: Secondary | ICD-10-CM

## 2019-11-24 DIAGNOSIS — I10 Essential (primary) hypertension: Secondary | ICD-10-CM

## 2019-11-24 DIAGNOSIS — F99 Mental disorder, not otherwise specified: Secondary | ICD-10-CM

## 2019-11-24 DIAGNOSIS — Z720 Tobacco use: Secondary | ICD-10-CM

## 2019-11-24 DIAGNOSIS — J302 Other seasonal allergic rhinitis: Secondary | ICD-10-CM

## 2019-11-24 DIAGNOSIS — N3010 Interstitial cystitis (chronic) without hematuria: Secondary | ICD-10-CM

## 2019-11-24 DIAGNOSIS — F418 Other specified anxiety disorders: Secondary | ICD-10-CM

## 2019-11-24 DIAGNOSIS — K219 Gastro-esophageal reflux disease without esophagitis: Secondary | ICD-10-CM

## 2019-11-24 DIAGNOSIS — F4310 Post-traumatic stress disorder, unspecified: Secondary | ICD-10-CM

## 2019-11-24 DIAGNOSIS — R0600 Dyspnea, unspecified: Secondary | ICD-10-CM

## 2019-11-24 DIAGNOSIS — K859 Acute pancreatitis without necrosis or infection, unspecified: Secondary | ICD-10-CM

## 2019-11-24 DIAGNOSIS — E7800 Pure hypercholesterolemia, unspecified: Secondary | ICD-10-CM

## 2019-11-24 DIAGNOSIS — F341 Dysthymic disorder: Secondary | ICD-10-CM

## 2019-11-24 DIAGNOSIS — G43909 Migraine, unspecified, not intractable, without status migrainosus: Secondary | ICD-10-CM

## 2019-11-24 MED ORDER — DIVALPROEX 250 MG PO TB24
250 mg | ORAL_TABLET | Freq: Every day | ORAL | 0 refills | 30.00000 days | Status: DC
Start: 2019-11-24 — End: 2020-01-16

## 2019-11-24 MED ORDER — SERTRALINE 50 MG PO TAB
50 mg | ORAL_TABLET | Freq: Every day | ORAL | 2 refills | Status: DC
Start: 2019-11-24 — End: 2020-01-16
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
## 2019-11-24 NOTE — Progress Notes
Two patient-identifiers verified for HIPPA  Compliance.  Patient verbally consented to telerhealth visit. Vitals provided by patient
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
## 2019-12-02 ENCOUNTER — Ambulatory Visit: Admit: 2019-12-02 | Discharge: 2019-12-02 | Payer: Commercial Managed Care - HMO

## 2019-12-02 ENCOUNTER — Encounter: Admit: 2019-12-02 | Discharge: 2019-12-02 | Payer: PRIVATE HEALTH INSURANCE

## 2019-12-02 DIAGNOSIS — N3010 Interstitial cystitis (chronic) without hematuria: Secondary | ICD-10-CM

## 2019-12-02 DIAGNOSIS — Z720 Tobacco use: Secondary | ICD-10-CM

## 2019-12-02 DIAGNOSIS — E7800 Pure hypercholesterolemia, unspecified: Secondary | ICD-10-CM

## 2019-12-02 DIAGNOSIS — G4733 Obstructive sleep apnea (adult) (pediatric): Secondary | ICD-10-CM

## 2019-12-02 DIAGNOSIS — J302 Other seasonal allergic rhinitis: Secondary | ICD-10-CM

## 2019-12-02 DIAGNOSIS — T753XXA Motion sickness, initial encounter: Secondary | ICD-10-CM

## 2019-12-02 DIAGNOSIS — H8190 Unspecified disorder of vestibular function, unspecified ear: Secondary | ICD-10-CM

## 2019-12-02 DIAGNOSIS — M1990 Unspecified osteoarthritis, unspecified site: Secondary | ICD-10-CM

## 2019-12-02 DIAGNOSIS — G44209 Tension-type headache, unspecified, not intractable: Secondary | ICD-10-CM

## 2019-12-02 DIAGNOSIS — M5481 Occipital neuralgia: Secondary | ICD-10-CM

## 2019-12-02 DIAGNOSIS — M549 Dorsalgia, unspecified: Secondary | ICD-10-CM

## 2019-12-02 DIAGNOSIS — G43909 Migraine, unspecified, not intractable, without status migrainosus: Secondary | ICD-10-CM

## 2019-12-02 DIAGNOSIS — R519 Generalized headaches: Secondary | ICD-10-CM

## 2019-12-02 DIAGNOSIS — I10 Essential (primary) hypertension: Secondary | ICD-10-CM

## 2019-12-02 DIAGNOSIS — R42 Dizziness and giddiness: Secondary | ICD-10-CM

## 2019-12-02 DIAGNOSIS — K219 Gastro-esophageal reflux disease without esophagitis: Secondary | ICD-10-CM

## 2019-12-02 DIAGNOSIS — R0600 Dyspnea, unspecified: Secondary | ICD-10-CM

## 2019-12-02 DIAGNOSIS — R29898 Other symptoms and signs involving the musculoskeletal system: Secondary | ICD-10-CM

## 2019-12-02 DIAGNOSIS — K859 Acute pancreatitis without necrosis or infection, unspecified: Secondary | ICD-10-CM

## 2019-12-02 DIAGNOSIS — F99 Mental disorder, not otherwise specified: Secondary | ICD-10-CM

## 2019-12-02 MED ORDER — PROCHLORPERAZINE MALEATE 10 MG PO TAB
10 mg | ORAL_TABLET | ORAL | 5 refills | Status: AC | PRN
Start: 2019-12-02 — End: ?

## 2019-12-02 MED ORDER — CLONAZEPAM 0.5 MG PO TAB
.75 mg | ORAL_TABLET | Freq: Two times a day (BID) | ORAL | 5 refills | Status: AC
Start: 2019-12-02 — End: ?

## 2019-12-02 MED ORDER — CLONAZEPAM 1 MG PO TAB
1 mg | ORAL_TABLET | ORAL | 5 refills | Status: AC | PRN
Start: 2019-12-02 — End: ?

## 2019-12-02 MED ORDER — ERENUMAB-AOOE 70 MG/ML SC ATIN
70 mg | SUBCUTANEOUS | 3 refills | 28.00000 days | Status: AC
Start: 2019-12-02 — End: ?

## 2019-12-02 NOTE — Telephone Encounter
PA started via Cover [employer] [place] (Key: [id number])  Rx #: [id number]  Aimovig 70MG /ML auto-injectors     Form  [employer] Electronic PA Form [id number] NCPDP)

## 2019-12-02 NOTE — Progress Notes
Date of Service: 12/02/2019    Subjective:             Christina Garrison is a 49 y.o. female with history of central dizziness, chronic headaches, anxiety, depression who presents for follow up    History of Present Illness  Since last visit, patient received carpal tunnel release surgery in her left wrist, and is scheduled to get her right wrist done later this month. She stated it didn't go as expected. She knew she was going to experience pain and limitation but not to this degree. She has noticed some improvement in the tingling in her left hand  ?  She states that its been a terrible week. She is complaining about ongoing dizziness and worsening headaches. She states that she's been exercising 4 miles every single day the last week and has been drinking protein and water (at least 70 oz). We discussed making sure to replace with at least one electrolyte based drink since she's been exercising vigorously.   ?  From a headache perspective, she took half a bottle of liquid tylenol this week because she continued to have the headache this week (primarily in the front of the head and in the back of the head). She continues to take the migraine cocktail for her headaches.  We discussed the possibility of trialing more trigger point injections/occipital nerve blocks.     From a dizziness perspective, she continues to have it, especially when moving her headache while reading or going into the light. She continues to take the clonazepam for it and finds it to be helpful.        Review of Systems   HENT: Positive for rhinorrhea.    Eyes: Positive for photophobia and itching.   Respiratory: Positive for apnea.    Endocrine: Positive for cold intolerance.   Musculoskeletal: Positive for myalgias and neck pain.   Allergic/Immunologic: Positive for environmental allergies.   Neurological: Positive for light-headedness and headaches.   Psychiatric/Behavioral: The patient is nervous/anxious.    All other systems reviewed and are negative.    Objective:         ? biotin 5,000 mcg TbDi Dissolve 2 tablets by mouth twice daily.   ? clonazePAM (KLONOPIN) 0.5 mg tablet Take 1.5 tablets by mouth twice daily.   ? clonazePAM (KLONOPIN) 1 mg tablet Take one tablet by mouth as Needed (For driving).   ? cyanocobalamin (vitamin B-12) 2,500 mcg tab Take 1 tablet by mouth daily with breakfast.   ? diphenhydrAMINE (BENADRYL ALLERGY) 12.5 mg/5 mL oral solution Take 12.5 mg by mouth every 6 hours as needed.   ? divalproex ER (DEPAKOTE ER) 250 mg ER tablet Take one tablet by mouth daily. Take with food.   Indications: irritability, depression adjunctive treatment   ? erenumab-aooe (AIMOVIG) 70 mg/mL injection syringe Inject 1 mL under the skin every 30 days.   ? melatonin 3 mg tab Take one tablet by mouth at bedtime daily.   ? multivit, Ca, min-FA-soy isofl 400-60 mcg-mg tab Take 1 tablet by mouth twice daily.   ? nebivoloL (BYSTOLIC) 5 mg tablet Take one tablet by mouth daily.   ? nicotine(+) (NICOTROL) 10 mg inhaler Inhale one puff by mouth into the lungs as Needed. Puff by mouth as needed. May use 6-16 cartridges per day as needed for up to 6 months.   ? pantoprazole DR (PROTONIX) 40 mg tablet Take one tablet by mouth daily.   ? POTASSIUM PO Take  by mouth  twice weekly.   ? prochlorperazine maleate (COMPAZINE) 10 mg tablet Take one tablet by mouth every 6 hours as needed for Nausea or Vomiting.   ? rosuvastatin (CRESTOR) 20 mg tablet TAKE 1 TABLET BY MOUTH EVERY DAY   ? scopolamine (TRANSDERM-SCOP) 1.5 mg 3 day patch Apply one patch to top of skin as directed every 72 hours.   ? sertraline (ZOLOFT) 50 mg tablet Take one tablet by mouth daily. Indications: major depressive disorder   ? Verapamil 40 mg tab 20 mg in AM and 40 mg in PM   ? vitamins, multi w/minerals 9 mg iron-400 mcg tab Take 1 tablet by mouth daily with breakfast.     Vitals:    12/02/19 0946   BP: 127/86   BP Source: Arm, Left Upper   Patient Position: Sitting   Pulse: 61   Weight: 101.2 kg (223 lb)   Height: 165.1 cm (65)   PainSc: Six     Body mass index is 37.11 kg/m?Marland Kitchen     General physical exam:    HEENT: normocephalic, eyes open with no discharge, nares patent, oropharynx is clear with no lesions, palate intact  CV: regular rate and rhythm, no murmur, distal pulses palpable  Chest: normal configuration  Skin: no rashes or lesions    Neuro exam:   Mental status: Awake, alert, and oriented to person, place, time and location/situation  Memory: recent and remote memory intact  Attention: good span, follows conversation  Fund of knowledge: appropriate   Level of consciousness: alert    Speech:    Normal Abnormal   Fluency x    Comprehension x    Articulation x    Repetition x    Naming x        Cranial Nerves:    Normal Abnormal   II Pupils equal (2mm), round, reactive    III, IV, VI EOMI, no nystagmus    V Intact to LT in V1-3    VII Face symmetrical, eye closure symmetrical    VIII Hearing intact to finger rub    IX, X Uvula midline and palate elevation symmetrical    XI Shrug equal     XII Tongue midline        Muscle/motor:   Tone: nml  Bulk: nml  Fasciculations: none  Pronator drift: absent     Right  Left   Right  Left    Shoulder abductors:  5  5 Hip flexors:  5  5   Elbow flexors:  5  5 Hip abductors:       Elbow extensors:  5  5 Hip extensors:      Wrist extensors:  5  5 Hip adductors:      Wrist flexors:  5  5  Knee flexors:  5  5   Finger flexors:    Knee extensors:  5  5   Finger extensors:    Ankle plantar flexors:  5  5   Finger abductors:  5  5 Ankle dorsiflexors:  5  5   Thumb abductors:  5  5 Ankle inversion:         Ankle eversion:          Toe extensors:          Toe flexors:       No abnormal movements, rigidity or spasticity    Reflexes:    Right Left   Triceps 2 2   Biceps 2 2   Brachioradialis 2  2   Patella 2 2   Ankle 2 2   Plantar Flexor Flexor       Sensation:    Normal RUE LUE RLE LLE   Light Touch x       Pin Prick        Temperature        Vibration        Proprioception Sensory level: absent    Coordination:    Normal Abnormal Right Abnormal Left   Finger to Nose x     Rapid alternating       Heel to [employer] tap      Foot tap      Other        Gait and Station:  Regular gait: normal base and arm swing     Assessment and Plan:    Problem   Bilateral Arm Weakness    Describes neck manipulation at chiropractor starting in May, bilateral arm weakness/numbness/pain that began one week ago, and ongoing symptoms of bi-occipital, posterior neck pain moving anteriorly since June 2020      CTA head and neck with and without contrast negative for any dissection, large vessel stenosis    No longer endorsing these symptoms, but does state post L carpal tunnel release surgery, she's been having pain in her left wrist      Bilateral Occipital Neuralgia    Describes bilateral neck/occipital region pain that moves anteriorly. Tenderness to palpation on bilateral trap>occipital grooves.     Currently being seen by chronic anesthesiology for nerve blocks      Vestibulopathy    Longstanding issues with dizziness. Described as a jerking sensation lasting seconds.     MRI brain with and without contrast negative    Evaluation by neuro-otologist did not find any pathological findings    Continues to endorse some intermittent dizziness      Tension Headache    frontal/L eye pressure since her 30s with conjunctival injection/photo/phono.  Able to work through this without any medication.  Sleeps in positions that provoke neck/back pain.  Significant greater occipital tenderness on exam  Improved with treatment of OSA, and after occipital nerve blocks (though poorly tolerated as above)  History of frontal meningocele (resected), head trauma (fell down stairs)    Currently taking a swig of liquid tylenol x2 a week to help treat. Also taking migraine cocktail for it with mild improvement           Bilateral occipital neuralgia  Recommend scheduling anesthesia appointment for occipital nerve blocks/trigger point injections    Bilateral arm weakness  Continue to monitor     Vestibulopathy  Continue clonazepam     Tension headache  Will trial Amiovig   Recommend following up with chronic anesthesia pain for possible trigger point injections/occipital nerve blocks     Follow up in 6 months     Patient seen and discussed with Dr. Berline Lopes, MD  PGY-4/Neurology  Personal pager [phone]  Neurology stroke pager [phone]  Neurology on call/consults [phone]  [employer]

## 2019-12-02 NOTE — Progress Notes
I have personally interviewed and examined Christina Garrison and reviewed the history, examination, impression, and plan of care as outlined by Adella Hare, MD. I personally  participated in patient counseling and coordination of care.  I agree with the assessment and plan as documented by Dr. Juanda Chance, MD.

## 2019-12-02 NOTE — Assessment & Plan Note
Will trial Amiovig   Recommend following up with chronic anesthesia pain for possible trigger point injections/occipital nerve blocks

## 2019-12-02 NOTE — Assessment & Plan Note
Continue clonazepam. 

## 2019-12-02 NOTE — Assessment & Plan Note
Recommend scheduling anesthesia appointment for occipital nerve blocks/trigger point injections

## 2019-12-02 NOTE — Assessment & Plan Note
Continue to monitor

## 2019-12-09 ENCOUNTER — Encounter: Admit: 2019-12-09 | Discharge: 2019-12-09 | Payer: PRIVATE HEALTH INSURANCE

## 2019-12-09 DIAGNOSIS — R42 Dizziness and giddiness: Secondary | ICD-10-CM

## 2019-12-09 DIAGNOSIS — N3010 Interstitial cystitis (chronic) without hematuria: Secondary | ICD-10-CM

## 2019-12-09 DIAGNOSIS — K859 Acute pancreatitis without necrosis or infection, unspecified: Secondary | ICD-10-CM

## 2019-12-09 DIAGNOSIS — F99 Mental disorder, not otherwise specified: Secondary | ICD-10-CM

## 2019-12-09 DIAGNOSIS — Z720 Tobacco use: Secondary | ICD-10-CM

## 2019-12-09 DIAGNOSIS — G4733 Obstructive sleep apnea (adult) (pediatric): Secondary | ICD-10-CM

## 2019-12-09 DIAGNOSIS — T753XXA Motion sickness, initial encounter: Secondary | ICD-10-CM

## 2019-12-09 DIAGNOSIS — I10 Essential (primary) hypertension: Secondary | ICD-10-CM

## 2019-12-09 DIAGNOSIS — E7800 Pure hypercholesterolemia, unspecified: Secondary | ICD-10-CM

## 2019-12-09 DIAGNOSIS — R519 Generalized headaches: Secondary | ICD-10-CM

## 2019-12-09 DIAGNOSIS — M1990 Unspecified osteoarthritis, unspecified site: Secondary | ICD-10-CM

## 2019-12-09 DIAGNOSIS — J302 Other seasonal allergic rhinitis: Secondary | ICD-10-CM

## 2019-12-09 DIAGNOSIS — K219 Gastro-esophageal reflux disease without esophagitis: Secondary | ICD-10-CM

## 2019-12-09 DIAGNOSIS — M549 Dorsalgia, unspecified: Secondary | ICD-10-CM

## 2019-12-09 DIAGNOSIS — R0600 Dyspnea, unspecified: Secondary | ICD-10-CM

## 2019-12-09 DIAGNOSIS — G43909 Migraine, unspecified, not intractable, without status migrainosus: Secondary | ICD-10-CM

## 2019-12-27 MED ORDER — ONDANSETRON HCL (PF) 4 MG/2 ML IJ SOLN
0 refills | Status: DC
Start: 2019-12-27 — End: 2019-12-27
  Administered 2019-12-27: 13:00:00 4 mg via INTRAVENOUS

## 2019-12-27 MED ORDER — LIDOCAINE (PF) 10 MG/ML (1 %) IJ SOLN
.1-2 mL | INTRAMUSCULAR | 0 refills | Status: DC | PRN
Start: 2019-12-27 — End: 2019-12-27

## 2019-12-27 MED ORDER — LIDOCAINE (PF) 5 MG/ML (0.5 %) IJ SOLN
INTRAMUSCULAR | 0 refills | Status: CP
Start: 2019-12-27 — End: ?
  Administered 2019-12-27: 13:00:00 50 mL via INTRAMUSCULAR

## 2019-12-27 MED ORDER — FENTANYL CITRATE (PF) 50 MCG/ML IJ SOLN
0 refills | Status: DC
Start: 2019-12-27 — End: 2019-12-27
  Administered 2019-12-27: 13:00:00 25 ug via INTRAVENOUS

## 2019-12-27 MED ORDER — DIPHENHYDRAMINE HCL 50 MG/ML IJ SOLN
25 mg | Freq: Once | INTRAVENOUS | 0 refills | Status: DC | PRN
Start: 2019-12-27 — End: 2019-12-27

## 2019-12-27 MED ORDER — PROPOFOL INJ 10 MG/ML IV VIAL
0 refills | Status: DC
Start: 2019-12-27 — End: 2019-12-27
  Administered 2019-12-27: 14:00:00 20 mg via INTRAVENOUS
  Administered 2019-12-27: 13:00:00 30 mg via INTRAVENOUS
  Administered 2019-12-27: 13:00:00 50 mg via INTRAVENOUS

## 2019-12-27 MED ORDER — MIDAZOLAM 1 MG/ML IJ SOLN
INTRAVENOUS | 0 refills | Status: DC
Start: 2019-12-27 — End: 2019-12-27
  Administered 2019-12-27: 13:00:00 2 mg via INTRAVENOUS

## 2019-12-27 MED ORDER — OXYCODONE 5 MG PO TAB
5 mg | ORAL_TABLET | ORAL | 0 refills | 6.00000 days | Status: AC | PRN
Start: 2019-12-27 — End: ?
  Filled 2019-12-27: qty 12, 2d supply, fill #1

## 2019-12-27 MED ORDER — HALOPERIDOL LACTATE 5 MG/ML IJ SOLN
1 mg | Freq: Once | INTRAVENOUS | 0 refills | Status: DC | PRN
Start: 2019-12-27 — End: 2019-12-27

## 2019-12-27 MED ORDER — LACTATED RINGERS IV SOLP
1000 mL | INTRAVENOUS | 0 refills | Status: DC
Start: 2019-12-27 — End: 2019-12-27
  Administered 2019-12-27: 12:00:00 1000 mL via INTRAVENOUS
  Administered 2019-12-27: 13:00:00 1000.000 mL via INTRAVENOUS

## 2019-12-27 MED ORDER — FENTANYL CITRATE (PF) 50 MCG/ML IJ SOLN
25 ug | INTRAVENOUS | 0 refills | Status: DC | PRN
Start: 2019-12-27 — End: 2019-12-27
  Administered 2019-12-27 (×3): 25 ug via INTRAVENOUS

## 2019-12-27 MED ORDER — LIDOCAINE 1% 50ML/EPINEPHRINE 1 MG/LR 1000 ML IRR (OR)
Freq: Once | 0 refills | Status: DC
Start: 2019-12-27 — End: 2019-12-27

## 2019-12-27 MED ORDER — PROPOFOL 10 MG/ML IV EMUL 20 ML (INFUSION)(AM)(OR)
INTRAVENOUS | 0 refills | Status: DC
Start: 2019-12-27 — End: 2019-12-27
  Administered 2019-12-27: 13:00:00 50 ug/kg/min via INTRAVENOUS

## 2019-12-27 MED ORDER — ACETAMINOPHEN 500 MG PO TAB
1000 mg | Freq: Once | ORAL | 0 refills | Status: CP
Start: 2019-12-27 — End: ?
  Administered 2019-12-27: 13:00:00 1000 mg via ORAL

## 2019-12-27 MED ORDER — LIDOCAINE (PF) 200 MG/10 ML (2 %) IJ SYRG
0 refills | Status: DC
Start: 2019-12-27 — End: 2019-12-27
  Administered 2019-12-27: 13:00:00 40 mg via INTRAVENOUS

## 2019-12-27 MED ORDER — OXYCODONE 5 MG PO TAB
5-10 mg | Freq: Once | ORAL | 0 refills | Status: CP | PRN
Start: 2019-12-27 — End: ?
  Administered 2019-12-27: 14:00:00 10 mg via ORAL

## 2019-12-27 MED ORDER — CEFAZOLIN 1 GRAM IJ SOLR
0 refills | Status: DC
Start: 2019-12-27 — End: 2019-12-27
  Administered 2019-12-27: 13:00:00 2 g via INTRAVENOUS

## 2019-12-27 MED ADMIN — FENTANYL CITRATE (PF) 50 MCG/ML IJ SOLN [3037]: 25 ug | INTRAVENOUS | @ 14:00:00 | Stop: 2019-12-27 | NDC 00409909412
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
## 2020-01-16 ENCOUNTER — Ambulatory Visit: Admit: 2020-01-16 | Discharge: 2020-01-17 | Payer: Commercial Managed Care - HMO

## 2020-01-16 ENCOUNTER — Encounter: Admit: 2020-01-16 | Discharge: 2020-01-16 | Payer: PRIVATE HEALTH INSURANCE

## 2020-01-16 DIAGNOSIS — F418 Other specified anxiety disorders: Secondary | ICD-10-CM

## 2020-01-16 DIAGNOSIS — F341 Dysthymic disorder: Secondary | ICD-10-CM

## 2020-01-16 DIAGNOSIS — F4310 Post-traumatic stress disorder, unspecified: Secondary | ICD-10-CM

## 2020-01-16 MED ORDER — SERTRALINE 50 MG PO TAB
ORAL_TABLET | Freq: Every day | 2 refills | Status: DC
Start: 2020-01-16 — End: 2020-02-09

## 2020-01-16 MED ORDER — DIVALPROEX 250 MG PO TBEC
250 mg | ORAL_TABLET | Freq: Two times a day (BID) | ORAL | 2 refills | 30.00000 days | Status: DC
Start: 2020-01-16 — End: 2020-04-10

## 2020-01-16 NOTE — Patient Instructions
-   increase zoloft to 75 mg daily for 2 weeks then up to 100 mg daily thereafter  - please continue to engage in therapy when your therapist is able.   - continue depakote DR 250 mg bid as prescribed     Return to clinic in 2 months.  Please call the clinic to reschedule or cancel appointments if somethings changes.     If you need a medication refill, please call your pharmacy to request refills.     In the event of a safety concern or suicidal thoughts, call 911 or go to the nearest emergency room. National Suicide Prevention Lifeline (765)087-8463 (Talk). Crisis Text Hotline (text 902 072 0182).     The [facility] phone line is a resource for talk support in non-emergency situations (1-866-WARMEAR).    Please Christina Garrison not hesitate to call the clinic, if you have questions regarding your psychiatric medications or start to develop side effects to medications prescribed by your psychiatrist.

## 2020-01-16 NOTE — Progress Notes
Patient identified with 2 identifiers. Patient consent to telehealth visit

## 2020-01-18 MED ORDER — MELOXICAM 15 MG PO TAB
15 mg | ORAL_TABLET | Freq: Every day | ORAL | 0 refills | 30.00000 days | Status: DC
Start: 2020-01-18 — End: 2020-01-18

## 2020-01-20 ENCOUNTER — Encounter: Admit: 2020-01-20 | Discharge: 2020-01-20 | Payer: PRIVATE HEALTH INSURANCE

## 2020-01-24 ENCOUNTER — Emergency Department
Admit: 2020-01-24 | Discharge: 2020-01-24 | Disposition: A | Discharge status: Home / Self Care | Payer: Commercial Managed Care - HMO

## 2020-01-24 ENCOUNTER — Encounter: Admit: 2020-01-24 | Discharge: 2020-01-24 | Payer: PRIVATE HEALTH INSURANCE

## 2020-01-24 DIAGNOSIS — R42 Dizziness and giddiness: Secondary | ICD-10-CM

## 2020-01-24 DIAGNOSIS — G5601 Carpal tunnel syndrome, right upper limb: Secondary | ICD-10-CM

## 2020-01-24 DIAGNOSIS — K219 Gastro-esophageal reflux disease without esophagitis: Secondary | ICD-10-CM

## 2020-01-24 DIAGNOSIS — R0600 Dyspnea, unspecified: Secondary | ICD-10-CM

## 2020-01-24 DIAGNOSIS — E7800 Pure hypercholesterolemia, unspecified: Secondary | ICD-10-CM

## 2020-01-24 DIAGNOSIS — J302 Other seasonal allergic rhinitis: Secondary | ICD-10-CM

## 2020-01-24 DIAGNOSIS — T753XXA Motion sickness, initial encounter: Secondary | ICD-10-CM

## 2020-01-24 DIAGNOSIS — M549 Dorsalgia, unspecified: Secondary | ICD-10-CM

## 2020-01-24 DIAGNOSIS — G43909 Migraine, unspecified, not intractable, without status migrainosus: Secondary | ICD-10-CM

## 2020-01-24 DIAGNOSIS — I10 Essential (primary) hypertension: Principal | ICD-10-CM

## 2020-01-24 DIAGNOSIS — R519 Generalized headaches: Secondary | ICD-10-CM

## 2020-01-24 DIAGNOSIS — M1990 Unspecified osteoarthritis, unspecified site: Secondary | ICD-10-CM

## 2020-01-24 DIAGNOSIS — G4733 Obstructive sleep apnea (adult) (pediatric): Secondary | ICD-10-CM

## 2020-01-24 DIAGNOSIS — L237 Allergic contact dermatitis due to plants, except food: Secondary | ICD-10-CM

## 2020-01-24 DIAGNOSIS — Z720 Tobacco use: Secondary | ICD-10-CM

## 2020-01-24 DIAGNOSIS — K859 Acute pancreatitis without necrosis or infection, unspecified: Secondary | ICD-10-CM

## 2020-01-24 DIAGNOSIS — F99 Mental disorder, not otherwise specified: Secondary | ICD-10-CM

## 2020-01-24 DIAGNOSIS — N3010 Interstitial cystitis (chronic) without hematuria: Secondary | ICD-10-CM

## 2020-01-24 MED ORDER — TRAMADOL 50 MG PO TAB
50 mg | Freq: Once | ORAL | 0 refills | Status: CP
Start: 2020-01-24 — End: ?
  Administered 2020-01-24: 21:00:00 50 mg via ORAL

## 2020-01-24 MED ORDER — ONDANSETRON 4 MG PO TBDI
4 mg | ORAL_TABLET | ORAL | 0 refills | 8.00000 days | Status: AC | PRN
Start: 2020-01-24 — End: ?

## 2020-01-24 MED ORDER — PREDNISONE 20 MG PO TAB
ORAL_TABLET | Freq: Every day | ORAL | 0 refills | Status: AC
Start: 2020-01-24 — End: ?

## 2020-01-24 NOTE — ED Notes
Discharge instructions given and reviewed with patient. No further questions at this time. Patient verbalizes understanding of discharge instructions. All belongings returned to patient.

## 2020-01-24 NOTE — ED Notes
Jenniferlynn Wilds is a 49 y.o female presenting to ED 30 with CC of skin rash. She states she went to the ED in her hometown and told she has a skin infection. She then went to her PCP and told she has poison ivy. She started an OTC hydrocortisone cream without relief. Kolleen E Bonenfant has a past medical history of Arthritis, Back pain, Carpal tunnel syndrome on right (12/27/2019), Dyspnea (per pt-post op 2015.  On home O2 for 9 mo following gastric sleeve surgery due to fluid around lungs), Generalized headaches, GERD (gastroesophageal reflux disease), High cholesterol, HTN (hypertension), Hypertension (11/21/2010), Interstitial cystitis, Migraines, Motion sickness, OSA (obstructive sleep apnea) (uses NC w CA), Pancreatitis (11/21/2010), Psychiatric illness (Bipolar; Anxiety; Depression), Seasonal allergic reaction, Tobacco abuse, and Vertigo.  Patient is alert and oriented x4. Follows commands. Moves all extremities equally. VSS. Lungs clear to auscultation. Eupneic. Skin warm/dry. Pt with facial, neck, trunk, and back rashes with purulent drainage. Denies nausea/vomiting/chest pain/SOA/fever/chills. Cart in lowest position, wheels locked, side rails up. Will continue to monitor and await provider evaluation.

## 2020-01-24 NOTE — ED Notes
Swabs sent by Leafy Half, APRN.

## 2020-01-25 LAB — GRAM STAIN

## 2020-01-27 ENCOUNTER — Encounter: Admit: 2020-01-27 | Discharge: 2020-01-27 | Payer: PRIVATE HEALTH INSURANCE

## 2020-01-27 ENCOUNTER — Ambulatory Visit: Admit: 2020-01-27 | Discharge: 2020-01-28 | Payer: Commercial Managed Care - HMO

## 2020-01-27 DIAGNOSIS — R519 Generalized headaches: Secondary | ICD-10-CM

## 2020-01-27 DIAGNOSIS — I10 Essential (primary) hypertension: Secondary | ICD-10-CM

## 2020-01-27 DIAGNOSIS — L237 Allergic contact dermatitis due to plants, except food: Secondary | ICD-10-CM

## 2020-01-27 DIAGNOSIS — F329 Major depressive disorder, single episode, unspecified: Secondary | ICD-10-CM

## 2020-01-27 DIAGNOSIS — J029 Acute pharyngitis, unspecified: Secondary | ICD-10-CM

## 2020-01-27 DIAGNOSIS — N3010 Interstitial cystitis (chronic) without hematuria: Secondary | ICD-10-CM

## 2020-01-27 DIAGNOSIS — G5601 Carpal tunnel syndrome, right upper limb: Secondary | ICD-10-CM

## 2020-01-27 DIAGNOSIS — K859 Acute pancreatitis without necrosis or infection, unspecified: Secondary | ICD-10-CM

## 2020-01-27 DIAGNOSIS — G4733 Obstructive sleep apnea (adult) (pediatric): Secondary | ICD-10-CM

## 2020-01-27 DIAGNOSIS — F99 Mental disorder, not otherwise specified: Secondary | ICD-10-CM

## 2020-01-27 DIAGNOSIS — E7800 Pure hypercholesterolemia, unspecified: Secondary | ICD-10-CM

## 2020-01-27 DIAGNOSIS — J302 Other seasonal allergic rhinitis: Secondary | ICD-10-CM

## 2020-01-27 DIAGNOSIS — R0600 Dyspnea, unspecified: Secondary | ICD-10-CM

## 2020-01-27 DIAGNOSIS — G43909 Migraine, unspecified, not intractable, without status migrainosus: Secondary | ICD-10-CM

## 2020-01-27 DIAGNOSIS — M542 Cervicalgia: Secondary | ICD-10-CM

## 2020-01-27 DIAGNOSIS — M1990 Unspecified osteoarthritis, unspecified site: Secondary | ICD-10-CM

## 2020-01-27 DIAGNOSIS — T753XXA Motion sickness, initial encounter: Secondary | ICD-10-CM

## 2020-01-27 DIAGNOSIS — M549 Dorsalgia, unspecified: Secondary | ICD-10-CM

## 2020-01-27 DIAGNOSIS — R42 Dizziness and giddiness: Secondary | ICD-10-CM

## 2020-01-27 DIAGNOSIS — Z720 Tobacco use: Secondary | ICD-10-CM

## 2020-01-27 DIAGNOSIS — K219 Gastro-esophageal reflux disease without esophagitis: Secondary | ICD-10-CM

## 2020-01-27 MED ORDER — PREDNISONE 20 MG PO TAB
ORAL_TABLET | Freq: Every day | 0 refills | Status: AC
Start: 2020-01-27 — End: ?

## 2020-01-27 MED ORDER — DOXYCYCLINE HYCLATE 100 MG PO CAP
100 mg | ORAL_CAPSULE | Freq: Two times a day (BID) | ORAL | 0 refills | 8.00000 days | Status: AC
Start: 2020-01-27 — End: ?

## 2020-01-27 NOTE — Progress Notes
ATTESTATION    I personally performed the key portions of the E/M visit, discussed case with resident and concur with resident documentation of history, physical exam, assessment, and treatment plan unless otherwise noted.    Staff name:  Tamrah Victorino A Wang-Weinman, MD Date:  01/27/2020

## 2020-02-01 ENCOUNTER — Encounter: Admit: 2020-02-01 | Discharge: 2020-02-01 | Payer: PRIVATE HEALTH INSURANCE

## 2020-02-01 MED ORDER — PANTOPRAZOLE 40 MG PO TBEC
40 mg | ORAL_TABLET | Freq: Every day | ORAL | 2 refills | 90.00000 days | Status: AC
Start: 2020-02-01 — End: ?

## 2020-02-01 NOTE — Telephone Encounter
Refill request received for pantoprazole  Last OV 02/10/2019  Last fill 11/01/19 30 tabs w/2 refills    Routing to Volney American, APRN for approval/refusal

## 2020-02-01 NOTE — Telephone Encounter
Needs OV for additional refills. 

## 2020-02-02 ENCOUNTER — Encounter: Admit: 2020-02-02 | Discharge: 2020-02-02 | Payer: PRIVATE HEALTH INSURANCE

## 2020-02-02 DIAGNOSIS — I10 Essential (primary) hypertension: Secondary | ICD-10-CM

## 2020-02-02 MED ORDER — BYSTOLIC 5 MG PO TAB
ORAL_TABLET | Freq: Every day | ORAL | 1 refills | 60.00000 days | Status: AC
Start: 2020-02-02 — End: ?

## 2020-02-09 ENCOUNTER — Encounter: Admit: 2020-02-09 | Discharge: 2020-02-09 | Payer: PRIVATE HEALTH INSURANCE

## 2020-02-09 DIAGNOSIS — F418 Other specified anxiety disorders: Secondary | ICD-10-CM

## 2020-02-09 DIAGNOSIS — F341 Dysthymic disorder: Secondary | ICD-10-CM

## 2020-02-09 DIAGNOSIS — F4310 Post-traumatic stress disorder, unspecified: Secondary | ICD-10-CM

## 2020-02-09 MED ORDER — SERTRALINE 100 MG PO TAB
100 mg | ORAL_TABLET | Freq: Every day | ORAL | 0 refills | Status: AC
Start: 2020-02-09 — End: ?

## 2020-02-12 ENCOUNTER — Encounter: Admit: 2020-02-12 | Discharge: 2020-02-12 | Payer: PRIVATE HEALTH INSURANCE

## 2020-02-13 MED ORDER — PREDNISONE 20 MG PO TAB
ORAL_TABLET | Freq: Every day | 0 refills
Start: 2020-02-13 — End: ?

## 2020-02-15 ENCOUNTER — Encounter: Admit: 2020-02-15 | Discharge: 2020-02-15 | Payer: PRIVATE HEALTH INSURANCE

## 2020-02-15 ENCOUNTER — Ambulatory Visit: Admit: 2020-02-15 | Discharge: 2020-02-16 | Payer: Commercial Managed Care - HMO

## 2020-02-15 DIAGNOSIS — R42 Dizziness and giddiness: Secondary | ICD-10-CM

## 2020-02-15 DIAGNOSIS — Z720 Tobacco use: Secondary | ICD-10-CM

## 2020-02-15 DIAGNOSIS — G4733 Obstructive sleep apnea (adult) (pediatric): Secondary | ICD-10-CM

## 2020-02-15 DIAGNOSIS — I10 Essential (primary) hypertension: Secondary | ICD-10-CM

## 2020-02-15 DIAGNOSIS — R0600 Dyspnea, unspecified: Secondary | ICD-10-CM

## 2020-02-15 DIAGNOSIS — F99 Mental disorder, not otherwise specified: Secondary | ICD-10-CM

## 2020-02-15 DIAGNOSIS — G5601 Carpal tunnel syndrome, right upper limb: Secondary | ICD-10-CM

## 2020-02-15 DIAGNOSIS — N3010 Interstitial cystitis (chronic) without hematuria: Secondary | ICD-10-CM

## 2020-02-15 DIAGNOSIS — M549 Dorsalgia, unspecified: Secondary | ICD-10-CM

## 2020-02-15 DIAGNOSIS — T753XXA Motion sickness, initial encounter: Secondary | ICD-10-CM

## 2020-02-15 DIAGNOSIS — G5602 Carpal tunnel syndrome, left upper limb: Secondary | ICD-10-CM

## 2020-02-15 DIAGNOSIS — R519 Generalized headaches: Secondary | ICD-10-CM

## 2020-02-15 DIAGNOSIS — K859 Acute pancreatitis without necrosis or infection, unspecified: Secondary | ICD-10-CM

## 2020-02-15 DIAGNOSIS — G43909 Migraine, unspecified, not intractable, without status migrainosus: Secondary | ICD-10-CM

## 2020-02-15 DIAGNOSIS — J302 Other seasonal allergic rhinitis: Secondary | ICD-10-CM

## 2020-02-15 DIAGNOSIS — E7800 Pure hypercholesterolemia, unspecified: Secondary | ICD-10-CM

## 2020-02-15 DIAGNOSIS — M1990 Unspecified osteoarthritis, unspecified site: Secondary | ICD-10-CM

## 2020-02-15 DIAGNOSIS — K219 Gastro-esophageal reflux disease without esophagitis: Secondary | ICD-10-CM

## 2020-02-15 NOTE — Progress Notes
Subjective:       History of Present Illness  Christina Garrison is a 49 y.o. female who is 8 weeks post op from right ECTR.  She has continued to have pain in the palm, describing it as tingling and burning when she pushes a door open or grabs a vacuum.  Patient also reports swelling and constriction.       Review of Systems   Constitutional: Negative.    HENT: Negative.    Eyes: Negative.    Respiratory: Negative.    Cardiovascular: Negative.    Gastrointestinal: Negative.    Endocrine: Negative.    Genitourinary: Negative.    Musculoskeletal: Negative.    Skin: Negative.    Allergic/Immunologic: Negative.    Neurological: Negative.    Hematological: Negative.    Psychiatric/Behavioral: Negative.          Objective:         ? biotin 5,000 mcg TbDi Dissolve 2 tablets by mouth twice daily.   ? BYSTOLIC 5 mg tablet TAKE 1 TABLET BY MOUTH EVERY DAY   ? clonazePAM (KLONOPIN) 0.5 mg tablet Take 1.5 tablets by mouth twice daily.   ? clonazePAM (KLONOPIN) 1 mg tablet Take one tablet by mouth as Needed (For driving).   ? cyanocobalamin (vitamin B-12) 2,500 mcg tab Take 1 tablet by mouth daily with breakfast.   ? diphenhydrAMINE (BENADRYL ALLERGY) 12.5 mg/5 mL oral solution Take 12.5 mg by mouth every 6 hours as needed.   ? divalproex (DEPAKOTE EC) 250 mg DR tablet Take one tablet by mouth twice daily. Take with food.  Indications: bipolar depression, hx of bipolar Ii   ? erenumab-aooe (AIMOVIG) 70 mg/mL injection syringe Inject 1 mL under the skin every 30 days.   ? melatonin 3 mg tab Take one tablet by mouth at bedtime daily.   ? multivit, Ca, min-FA-soy isofl 400-60 mcg-mg tab Take 1 tablet by mouth twice daily.   ? nicotine(+) (NICOTROL) 10 mg inhaler Inhale one puff by mouth into the lungs as Needed. Puff by mouth as needed. May use 6-16 cartridges per day as needed for up to 6 months.   ? ondansetron (ZOFRAN ODT) 4 mg rapid dissolve tablet Dissolve one tablet by mouth every 8 hours as needed. Place on tongue to disolve. ? oxyCODONE (ROXICODONE) 5 mg tablet Take one tablet by mouth every 4 hours as needed for Pain   ? pantoprazole DR (PROTONIX) 40 mg tablet Take one tablet by mouth daily.   ? POTASSIUM PO Take  by mouth twice weekly.   ? predniSONE (DELTASONE) 20 mg tablet Take prednisone 60mg  (3 tabs) by mouth daily x1 week then 40 mg daily (2 tabs) x 1 week, then  20 mg daily (1 tab) x 1 week then stop   ? prochlorperazine maleate (COMPAZINE) 10 mg tablet Take one tablet by mouth every 6 hours as needed for Nausea or Vomiting.   ? rosuvastatin (CRESTOR) 20 mg tablet TAKE 1 TABLET BY MOUTH EVERY DAY   ? scopolamine (TRANSDERM-SCOP) 1.5 mg 3 day patch Apply one patch to top of skin as directed every 72 hours.   ? sertraline (ZOLOFT) 100 mg tablet Take one tablet by mouth daily. Indications: major depressive disorder   ? Verapamil 40 mg tab 20 mg in AM and 40 mg in PM   ? vitamins, multi w/minerals 9 mg iron-400 mcg tab Take 1 tablet by mouth daily with breakfast.     Vitals:    02/15/20 1434  BP: 137/76   Pulse: 86   Weight: 95.7 kg (211 lb)   Height: 165.1 cm (65)     Body mass index is 35.11 kg/m?Marland Kitchen     Physical Exam  Incision is well healed she is with some residual swelling.  She has pain on the ulnar side of the palm and a positive tinel at the elbow.         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  Follow up in 6 weeks, and if not better can consider steroid injection at the wrist.  She verbalized understanding and was in agreement with the treatment plan.  All of her questions were answered to her apparent satisfaction.

## 2020-03-19 ENCOUNTER — Encounter: Admit: 2020-03-19 | Discharge: 2020-03-19 | Payer: PRIVATE HEALTH INSURANCE

## 2020-03-21 ENCOUNTER — Ambulatory Visit: Admit: 2020-03-21 | Discharge: 2020-03-22 | Payer: Commercial Managed Care - HMO

## 2020-03-21 ENCOUNTER — Encounter: Admit: 2020-03-21 | Discharge: 2020-03-21 | Payer: PRIVATE HEALTH INSURANCE

## 2020-03-21 ENCOUNTER — Ambulatory Visit: Admit: 2020-03-21 | Discharge: 2020-03-21 | Payer: Commercial Managed Care - HMO

## 2020-03-21 DIAGNOSIS — K859 Acute pancreatitis without necrosis or infection, unspecified: Secondary | ICD-10-CM

## 2020-03-21 DIAGNOSIS — F17200 Nicotine dependence, unspecified, uncomplicated: Secondary | ICD-10-CM

## 2020-03-21 DIAGNOSIS — F99 Mental disorder, not otherwise specified: Secondary | ICD-10-CM

## 2020-03-21 DIAGNOSIS — R42 Dizziness and giddiness: Secondary | ICD-10-CM

## 2020-03-21 DIAGNOSIS — N62 Hypertrophy of breast: Secondary | ICD-10-CM

## 2020-03-21 DIAGNOSIS — M79641 Pain in right hand: Secondary | ICD-10-CM

## 2020-03-21 DIAGNOSIS — M549 Dorsalgia, unspecified: Secondary | ICD-10-CM

## 2020-03-21 DIAGNOSIS — J302 Other seasonal allergic rhinitis: Secondary | ICD-10-CM

## 2020-03-21 DIAGNOSIS — G43909 Migraine, unspecified, not intractable, without status migrainosus: Secondary | ICD-10-CM

## 2020-03-21 DIAGNOSIS — K219 Gastro-esophageal reflux disease without esophagitis: Secondary | ICD-10-CM

## 2020-03-21 DIAGNOSIS — G4733 Obstructive sleep apnea (adult) (pediatric): Secondary | ICD-10-CM

## 2020-03-21 DIAGNOSIS — N3010 Interstitial cystitis (chronic) without hematuria: Secondary | ICD-10-CM

## 2020-03-21 DIAGNOSIS — G5601 Carpal tunnel syndrome, right upper limb: Secondary | ICD-10-CM

## 2020-03-21 DIAGNOSIS — T753XXA Motion sickness, initial encounter: Secondary | ICD-10-CM

## 2020-03-21 DIAGNOSIS — R519 Generalized headaches: Secondary | ICD-10-CM

## 2020-03-21 DIAGNOSIS — R0600 Dyspnea, unspecified: Secondary | ICD-10-CM

## 2020-03-21 DIAGNOSIS — M1990 Unspecified osteoarthritis, unspecified site: Secondary | ICD-10-CM

## 2020-03-21 DIAGNOSIS — I10 Essential (primary) hypertension: Secondary | ICD-10-CM

## 2020-03-21 DIAGNOSIS — Z720 Tobacco use: Secondary | ICD-10-CM

## 2020-03-21 DIAGNOSIS — E7800 Pure hypercholesterolemia, unspecified: Secondary | ICD-10-CM

## 2020-03-21 DIAGNOSIS — E669 Obesity, unspecified: Secondary | ICD-10-CM

## 2020-03-21 MED ORDER — TRIAMCINOLONE ACETONIDE 40 MG/ML IJ SUSP
40 mg | Freq: Once | INTRAMUSCULAR | 0 refills | Status: CP
Start: 2020-03-21 — End: ?

## 2020-03-21 MED ORDER — LIDOCAINE HCL 10 MG/ML (1 %) IJ SOLN
1 mL | Freq: Once | INTRAMUSCULAR | 0 refills | Status: CP
Start: 2020-03-21 — End: ?

## 2020-03-21 NOTE — Progress Notes
Date of Service: 03/21/2020    Subjective:             Christina Garrison is a 49 y.o. female.    History of Present Illness  Overall doing well.  She states that the burning sensation in her right palm has resolved although she continues to have point tenderness over the hypothenar eminence.  She denies any numbness or tingling.  She has been trying to sleep with a pillow to decrease the numbness that she feels in her small finger.  This is somewhat difficult for her to do but the symptoms are not very severe at this point.  She is also seeing Dr. Dayton Bailiff for possible breast reduction.     Review of Systems   Constitutional: Negative.    HENT: Negative.    Eyes: Negative.    Respiratory: Negative.    Cardiovascular: Negative.    Gastrointestinal: Negative.    Endocrine: Negative.    Genitourinary: Negative.    Musculoskeletal:        Neck pain   Skin: Negative.    Allergic/Immunologic: Negative.    Neurological: Positive for headaches.   Hematological: Negative.    Psychiatric/Behavioral: Negative.          Objective:         ? biotin 5,000 mcg TbDi Dissolve 2 tablets by mouth twice daily.   ? BYSTOLIC 5 mg tablet TAKE 1 TABLET BY MOUTH EVERY DAY   ? clonazePAM (KLONOPIN) 0.5 mg tablet Take 1.5 tablets by mouth twice daily.   ? clonazePAM (KLONOPIN) 1 mg tablet Take one tablet by mouth as Needed (For driving).   ? cyanocobalamin (vitamin B-12) 2,500 mcg tab Take 1 tablet by mouth daily with breakfast.   ? diphenhydrAMINE (BENADRYL ALLERGY) 12.5 mg/5 mL oral solution Take 12.5 mg by mouth every 6 hours as needed.   ? divalproex (DEPAKOTE EC) 250 mg DR tablet Take one tablet by mouth twice daily. Take with food.  Indications: bipolar depression, hx of bipolar Ii   ? erenumab-aooe (AIMOVIG) 70 mg/mL injection syringe Inject 1 mL under the skin every 30 days.   ? melatonin 3 mg tab Take one tablet by mouth at bedtime daily.   ? multivit, Ca, min-FA-soy isofl 400-60 mcg-mg tab Take 1 tablet by mouth twice daily.   ? nicotine(+) (NICOTROL) 10 mg inhaler Inhale one puff by mouth into the lungs as Needed. Puff by mouth as needed. May use 6-16 cartridges per day as needed for up to 6 months.   ? ondansetron (ZOFRAN ODT) 4 mg rapid dissolve tablet Dissolve one tablet by mouth every 8 hours as needed. Place on tongue to disolve.   ? oxyCODONE (ROXICODONE) 5 mg tablet Take one tablet by mouth every 4 hours as needed for Pain   ? pantoprazole DR (PROTONIX) 40 mg tablet Take one tablet by mouth daily.   ? POTASSIUM PO Take  by mouth twice weekly.   ? predniSONE (DELTASONE) 20 mg tablet Take prednisone 60mg  (3 tabs) by mouth daily x1 week then 40 mg daily (2 tabs) x 1 week, then  20 mg daily (1 tab) x 1 week then stop   ? prochlorperazine maleate (COMPAZINE) 10 mg tablet Take one tablet by mouth every 6 hours as needed for Nausea or Vomiting.   ? rosuvastatin (CRESTOR) 20 mg tablet TAKE 1 TABLET BY MOUTH EVERY DAY   ? scopolamine (TRANSDERM-SCOP) 1.5 mg 3 day patch Apply one patch to top of skin as  directed every 72 hours.   ? sertraline (ZOLOFT) 100 mg tablet Take one tablet by mouth daily. Indications: major depressive disorder   ? Verapamil 40 mg tab 20 mg in AM and 40 mg in PM   ? vitamins, multi w/minerals 9 mg iron-400 mcg tab Take 1 tablet by mouth daily with breakfast.     Vitals:    03/21/20 1419   BP: 123/75   Pulse: 68   Weight: 97.1 kg (214 lb)   Height: 165.1 cm (65)     Body mass index is 35.61 kg/m?Marland Kitchen     Physical Exam  Vitals and nursing note reviewed.   Constitutional:       Appearance: She is well-developed.   HENT:      Head: Normocephalic and atraumatic.   Eyes:      Conjunctiva/sclera: Conjunctivae normal.   Cardiovascular:      Rate and Rhythm: Normal rate.   Pulmonary:      Effort: Pulmonary effort is normal.   Musculoskeletal:         General: Tenderness present. Normal range of motion.      Comments: Right hand with a fully healed incision.  Full range of motion.  Negative Tinel.  She has point tenderness over the hypothenar eminence.  No Tinel.  Digits are warm and well-perfused.   Skin:     General: Skin is warm and dry.   Neurological:      Mental Status: She is alert and oriented to person, place, and time.   Psychiatric:         Behavior: Behavior normal.         Thought Content: Thought content normal.         Judgment: Judgment normal.              Assessment and Plan:  Overall improving.  Because of her continued point tenderness over the hypothenar eminence, steroid injection was performed today she tolerated this well.  There were no complications.  She can follow-up with me on a as needed basis.  Is happy with the plan.

## 2020-03-21 NOTE — Progress Notes
Subjective:       History of Present Illness  Christina Garrison is a 49 y.o. female who presents today to discuss breast reduction. She reports back pain, shoulder pain, and rib pain related to her large breasts. She has had trigger point injections in her neck and shoulders and continues to have pain. She feels that her posture is affected by her breasts. She reports large breasts throughout her life. She had gastric sleeve and did loose some weight in her breast but reports that they continued to be large. She has gained some weight (~40 lb) since her bariatric surgery and is trying to loose weight again. She also reports issues with rashes under her breasts and is prescribed a topical antifungal by her PCP which does help with this.  Most breast size is 38DD but now only wears sports bras or no bras. Has issues with shoulder grooving if she does wear bras.  Owns a vineyard. She vapes equal to one PPD.    She has significant upper back pain, lower back pain, neck pain, shoulder pain, difficulty breathing when lying flat, bra strap grooving and rashes. These symptoms have been present for many years.  The patient has tried alternative treatments including supportive bras, trigger point injections.  She has been using alternative treatments for several years.  She has had rashes for many years years and treats them with antifungals prescribed by PCP.     She is trying to exercise and lose weight but is limited due to her breast size.       Review of Systems   Constitutional: Negative.    HENT: Negative.    Eyes: Negative.    Respiratory: Negative.    Cardiovascular: Negative.    Gastrointestinal: Negative.    Endocrine: Negative.    Genitourinary: Negative.    Musculoskeletal: Positive for back pain and neck pain.   Skin: Negative.    Allergic/Immunologic: Negative.    Neurological: Negative.    Hematological: Negative.    Psychiatric/Behavioral: Negative.          Objective:      Medical History:   Diagnosis Date ? Arthritis    ? Back pain    ? Carpal tunnel syndrome on right 12/27/2019   ? Dyspnea     per pt-post op 2015.  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 Generalized headaches    ? GERD (gastroesophageal reflux disease)    ? High cholesterol    ? HTN (hypertension)    ? Hypertension 11/21/2010   ? Interstitial cystitis    ? Migraines    ? Motion sickness    ? OSA (obstructive sleep apnea)     uses NC w CA   ? Pancreatitis 11/21/2010   ? Psychiatric illness     Bipolar; Anxiety; Depression   ? Seasonal allergic reaction    ? Tobacco abuse    ? Vertigo      Surgical History:   Procedure Laterality Date   ? SINUS SURGERY  2004   ? PR LAPS GSTRC RSTRICTIV PX LONGITUDINAL GASTRECTOMY  03/2014    per pt code blue the day after-I couldn't breath- I had fluid on my lungs ([place])   ? EGD N/A 08/23/2018    Performed by Vertell Novak, MD at [facility] OR   ? COLONOSCOPY DIAGNOSTIC WITH SPECIMEN COLLECTION BY BRUSHING/ WASHING - FLEXIBLE N/A 08/23/2018    Performed by Keenan Bachelor,  Rodena Medin, MD at [facility] OR   ? OPEN REPAIR SUPRAUMBILICAL HERNIA WITH MESH N/A 09/23/2018    Performed by Lelan Pons., MD at [facility] OR   ? 24HR pH w/ Impedance -OFF PPIs N/A 01/27/2019    Performed by Tommie Sams, MD at [facility] ENDO   ? ESOPHAGEAL MOTILITY STUDY N/A 01/27/2019    Performed by Tommie Sams, MD at [facility] ENDO   ? CARPAL TUNNEL RELEASE Left 10/2019   ? ENDOSCOPIC RELEASE OF TRANSVERSE CARPAL LIGAMENT- WRIST Left 10/21/2019    Performed by Marylene Land, MD at [facility] OR   ? ENDOSCOPIC RELEASE OF TRANSVERSE CARPAL LIGAMENT- WRIST Right 12/27/2019    Performed by Marylene Land, MD at [facility] OR   ? ELECTROCARDIOGRAM     ? HX APPENDECTOMY     ? HX CHOLECYSTECTOMY     ? HX PARTIAL HYSTERECTOMY       Family History   Problem Relation Age of Onset   ? Coronary Artery Disease Mother    ? Allergy-severe Mother    ? Diabetes Mother    ? Heart Attack Mother    ? High Cholesterol Mother    ? Hypertension Mother ? Asthma Mother    ? Coronary Artery Disease Father    ? Heart Attack Father    ? High Cholesterol Father    ? Hypertension Father    ? Hypertension Brother    ? Diabetes Sister    ? High Cholesterol Sister    ? Hypertension Sister    ? Diabetes Brother    ? Tumor Sister    ? Cancer Sister 55        Brain, deceased   ? Tumor Sister 79        2 in Brain   ? Heart Disease Sister 61     Social History     Socioeconomic History   ? Marital status: Married     Spouse name: Not on file   ? Number of children: Not on file   ? Years of education: Not on file   ? Highest education level: Not on file   Occupational History   ? Not on file   Tobacco Use   ? Smoking status: Former Smoker     Packs/day: 1.50     Years: 30.00     Pack years: 45.00     Types: Cigarettes     Quit date: 05/02/2012     Years since quitting: 7.8   ? Smokeless tobacco: Never Used   ? Tobacco comment: Currently Vapes   Substance and Sexual Activity   ? Alcohol use: No     Alcohol/week: 0.0 standard drinks   ? Drug use: No   ? Sexual activity: Not Currently     Birth control/protection: Surgical   Other Topics Concern   ? Not on file   Social History Narrative   ? Not on file     Vaping/E-liquid Use   ? Vaping Use Current Some Day User    ? Comments cartridge lasts 1 per week 2 percent      Vaping/E-liquid Substances   ? Nicotine Yes    ? Flavored No                 ? biotin 5,000 mcg TbDi Dissolve 2 tablets by mouth twice daily.   ? BYSTOLIC 5 mg tablet TAKE 1 TABLET BY MOUTH EVERY DAY   ? clonazePAM (KLONOPIN) 0.5 mg tablet  Take 1.5 tablets by mouth twice daily.   ? clonazePAM (KLONOPIN) 1 mg tablet Take one tablet by mouth as Needed (For driving).   ? cyanocobalamin (vitamin B-12) 2,500 mcg tab Take 1 tablet by mouth daily with breakfast.   ? diphenhydrAMINE (BENADRYL ALLERGY) 12.5 mg/5 mL oral solution Take 12.5 mg by mouth every 6 hours as needed.   ? divalproex (DEPAKOTE EC) 250 mg DR tablet Take one tablet by mouth twice daily. Take with food. Indications: bipolar depression, hx of bipolar Ii   ? erenumab-aooe (AIMOVIG) 70 mg/mL injection syringe Inject 1 mL under the skin every 30 days.   ? melatonin 3 mg tab Take one tablet by mouth at bedtime daily.   ? multivit, Ca, min-FA-soy isofl 400-60 mcg-mg tab Take 1 tablet by mouth twice daily.   ? nicotine(+) (NICOTROL) 10 mg inhaler Inhale one puff by mouth into the lungs as Needed. Puff by mouth as needed. May use 6-16 cartridges per day as needed for up to 6 months.   ? ondansetron (ZOFRAN ODT) 4 mg rapid dissolve tablet Dissolve one tablet by mouth every 8 hours as needed. Place on tongue to disolve.   ? oxyCODONE (ROXICODONE) 5 mg tablet Take one tablet by mouth every 4 hours as needed for Pain   ? pantoprazole DR (PROTONIX) 40 mg tablet Take one tablet by mouth daily.   ? POTASSIUM PO Take  by mouth twice weekly.   ? predniSONE (DELTASONE) 20 mg tablet Take prednisone 60mg  (3 tabs) by mouth daily x1 week then 40 mg daily (2 tabs) x 1 week, then  20 mg daily (1 tab) x 1 week then stop   ? prochlorperazine maleate (COMPAZINE) 10 mg tablet Take one tablet by mouth every 6 hours as needed for Nausea or Vomiting.   ? rosuvastatin (CRESTOR) 20 mg tablet TAKE 1 TABLET BY MOUTH EVERY DAY   ? scopolamine (TRANSDERM-SCOP) 1.5 mg 3 day patch Apply one patch to top of skin as directed every 72 hours.   ? sertraline (ZOLOFT) 100 mg tablet Take one tablet by mouth daily. Indications: major depressive disorder   ? Verapamil 40 mg tab 20 mg in AM and 40 mg in PM   ? vitamins, multi w/minerals 9 mg iron-400 mcg tab Take 1 tablet by mouth daily with breakfast.     Vitals:    03/21/20 0911   BP: 119/81   BP Source: Arm, Left Upper   Patient Position: Sitting   Pulse: 67   Temp: 36.1 ?C (97 ?F)   TempSrc: Skin   Weight: 101.6 kg (224 lb)   Height: 165.1 cm (65)   PainSc: Zero     Body mass index is 37.28 kg/m?Marland Kitchen     Physical Exam  Vitals reviewed.   Constitutional:       Appearance: Normal appearance. She is normal weight. HENT:      Head: Normocephalic and atraumatic.      Mouth/Throat:      Mouth: Mucous membranes are dry.      Pharynx: Oropharynx is clear.   Eyes:      Extraocular Movements: Extraocular movements intact.      Conjunctiva/sclera: Conjunctivae normal.      Pupils: Pupils are equal, round, and reactive to light.   Cardiovascular:      Pulses: Normal pulses.   Chest:      Comments: Breast measurements:         Sternal notch to nipple:   R 35.5cm  L 38cm   Inframammary to nipple: R 17.5cm L 17.5cm   Breast base width:   R 18.5cm L 18cm    Grade II-III breast ptosis bilaterally.    Intact light touch nipple sensation bilaterally.  Abdominal:      General: Abdomen is flat.      Palpations: Abdomen is soft.   Musculoskeletal:         General: Normal range of motion.   Skin:     General: Skin is warm and dry.      Capillary Refill: Capillary refill takes less than 2 seconds.   Neurological:      General: No focal deficit present.      Mental Status: She is alert and oriented to person, place, and time.   Psychiatric:         Mood and Affect: Mood normal.         Behavior: Behavior normal.         Thought Content: Thought content normal.         Judgment: Judgment normal.            Assessment and Plan:  The patient is a good candidate for bilateral breast reduction surgery.  I explained the risks of surgery including bleeding, infection, delayed wound healing, altered sensation of her nipple and areola, scarring and asymmetry.  I described the free nipple graft procedure with associated numbness, hypopigmentation.  I described to her the pedicle reduction and Wise pattern.    Patient is current with her mammogram.    Photos taken today.  Will send a request to her insurance company.     Explained that the surgery would be outpatient.  Plan on removing 750 grams left side and 600 grams right side.  Will plan on a B cup. I showed her what this size approximates.    Patient advised to take 4 week/s off of work.  No heavy lifting for four weeks.    The patient says she understand the risks and wants to proceed with the surgery.    Discussed patient's BMI.  The 37.28 and falls within the category of Obesity 2 (35 to <40); BMI plan is in process.   Told her I will not address the lateral thoracic fat and will provide separate quote for liposuction.       The diagnosis codes I would use include:  N62 Symptomatic macromastia  M54.2 Neck Pain  M54.6 Upper Back Pain  M54.5 Lower Back Pain  R51 Headache  M25.511 and M25.512 Shoulder Pain  M21.921 and M21.922 Shoulder grooving   L98.8 Abrasive Dermatitis   L30.4 Rash/intertrigo    The CPT code I would use to bill would be 19318 with a 50 modifier for bilateral surgery.    Problem List Items Addressed This Visit     Obesity (BMI 30-39.9)    Nicotine dependence - Primary    Hypertrophy of breast                         ATTESTATION    I personally performed the key portions of the E/M visit, discussed case with resident and concur with resident documentation of history, physical exam, assessment, and treatment plan unless otherwise noted.    Staff name:  Stevenson Clinch, MD Date:  03/21/2020

## 2020-03-21 NOTE — Procedures
The risks of steroid injection were extensively discussed with the patient.  These include but are not limited to pain, scarring, soft tissue atrophy/thinning, skin color changes and hypopigmentation, skin quality changes and telengectasias, injury to nearby structures such as vessels, and nerves, tendon injury and tendon rupture and need for further procedures. Nerve injury may result in temporary or permanent numbness. Vessel injury may result in hand or finger ischemia.   The patient understood these and wished to proceed. A witnessed written consent was obtained.      In an aseptic fashion, a total of  30 mg of kenalog 40 mixed in a 1:1 fashion with 1% lidocaine was injected into the right palm over the base/hypothenar eminence at the site of maximal tenderness.    There were no complications. A sterile dressing was applied.    The patient tolerated the procedure well. Post-injection expectations, limitations of the injection and care of the extremity were discussed.  Follow up plan was reviewed.

## 2020-03-27 ENCOUNTER — Encounter: Admit: 2020-03-27 | Discharge: 2020-03-27 | Payer: PRIVATE HEALTH INSURANCE

## 2020-03-27 MED ORDER — PREDNISONE 20 MG PO TAB
ORAL_TABLET | Freq: Every day | 0 refills
Start: 2020-03-27 — End: ?

## 2020-03-28 ENCOUNTER — Encounter: Admit: 2020-03-28 | Discharge: 2020-03-28 | Payer: PRIVATE HEALTH INSURANCE

## 2020-03-28 MED ORDER — NYSTATIN-TRIAMCINOLONE 100,000-0.1 UNIT/G-% TP CREA
Freq: Two times a day (BID) | TOPICAL | 0 refills | 25.00000 days | Status: AC
Start: 2020-03-28 — End: ?

## 2020-03-28 NOTE — Progress Notes
I spoke with patient about her itchy rash under both breasts.  She said it is bumpy like a heat rash.  Her PCP cant see her for awhile but usually orders Lortisone  Dr. Erlene Quan ordered mycolog cream to use sparingly for 10 days-2 weeks.  Follow up with PCP for any further issues.

## 2020-03-29 ENCOUNTER — Ambulatory Visit: Admit: 2020-03-29 | Discharge: 2020-03-29 | Payer: PRIVATE HEALTH INSURANCE

## 2020-03-29 ENCOUNTER — Encounter: Admit: 2020-03-29 | Discharge: 2020-03-29 | Payer: PRIVATE HEALTH INSURANCE

## 2020-03-29 DIAGNOSIS — N62 Hypertrophy of breast: Secondary | ICD-10-CM

## 2020-03-29 DIAGNOSIS — Z20822 Encounter for screening laboratory testing for COVID-19 virus in asymptomatic patient: Secondary | ICD-10-CM

## 2020-03-29 NOTE — Progress Notes
letter and pics sent to billing  cosmetic quote emailed to pt

## 2020-04-10 ENCOUNTER — Encounter: Admit: 2020-04-10 | Discharge: 2020-04-10 | Payer: PRIVATE HEALTH INSURANCE

## 2020-04-10 DIAGNOSIS — F4310 Post-traumatic stress disorder, unspecified: Secondary | ICD-10-CM

## 2020-04-10 DIAGNOSIS — F418 Other specified anxiety disorders: Secondary | ICD-10-CM

## 2020-04-10 DIAGNOSIS — F341 Dysthymic disorder: Secondary | ICD-10-CM

## 2020-04-10 MED ORDER — DIVALPROEX 250 MG PO TBEC
ORAL_TABLET | Freq: Two times a day (BID) | ORAL | 0 refills | 30.00000 days | Status: AC
Start: 2020-04-10 — End: ?

## 2020-04-10 NOTE — Telephone Encounter
Patient has been scheduled for 10/18 at 2:45 with Dr. Steva Colder. Thank you!

## 2020-04-25 ENCOUNTER — Encounter: Admit: 2020-04-25 | Discharge: 2020-04-25 | Payer: PRIVATE HEALTH INSURANCE

## 2020-04-25 MED ORDER — PANTOPRAZOLE 40 MG PO TBEC
ORAL_TABLET | Freq: Every day | ORAL | 0 refills | 90.00000 days | Status: AC
Start: 2020-04-25 — End: ?

## 2020-04-25 NOTE — Telephone Encounter
Needs office visit for additional refills.  

## 2020-04-25 NOTE — Telephone Encounter
Refill request received for pantoprazole  Last OV 02/10/2019  Last fill 02/01/20 30 tabs w/2 refills.      Per last refill note, pt needs OV for future refills.  Called and LVM for pt to call to be scheduled or reach out to PCP to take over filling this prescription.    Routing to Volney American, APRN for approval/refusal

## 2020-05-02 ENCOUNTER — Encounter: Admit: 2020-05-02 | Discharge: 2020-05-02 | Payer: PRIVATE HEALTH INSURANCE

## 2020-05-02 ENCOUNTER — Ambulatory Visit: Admit: 2020-05-02 | Discharge: 2020-05-02 | Payer: PRIVATE HEALTH INSURANCE

## 2020-05-02 ENCOUNTER — Ambulatory Visit: Admit: 2020-05-02 | Discharge: 2020-05-03 | Payer: Commercial Managed Care - HMO

## 2020-05-02 ENCOUNTER — Ambulatory Visit: Admit: 2020-05-02 | Discharge: 2020-05-02 | Payer: Commercial Managed Care - HMO

## 2020-05-02 ENCOUNTER — Encounter: Admit: 2020-05-02 | Discharge: 2020-05-02 | Payer: Commercial Managed Care - HMO

## 2020-05-02 DIAGNOSIS — K859 Acute pancreatitis without necrosis or infection, unspecified: Secondary | ICD-10-CM

## 2020-05-02 DIAGNOSIS — R519 Generalized headaches: Secondary | ICD-10-CM

## 2020-05-02 DIAGNOSIS — G43909 Migraine, unspecified, not intractable, without status migrainosus: Secondary | ICD-10-CM

## 2020-05-02 DIAGNOSIS — K219 Gastro-esophageal reflux disease without esophagitis: Secondary | ICD-10-CM

## 2020-05-02 DIAGNOSIS — F99 Mental disorder, not otherwise specified: Secondary | ICD-10-CM

## 2020-05-02 DIAGNOSIS — I10 Essential (primary) hypertension: Secondary | ICD-10-CM

## 2020-05-02 DIAGNOSIS — R42 Dizziness and giddiness: Secondary | ICD-10-CM

## 2020-05-02 DIAGNOSIS — E7800 Pure hypercholesterolemia, unspecified: Secondary | ICD-10-CM

## 2020-05-02 DIAGNOSIS — R0600 Dyspnea, unspecified: Secondary | ICD-10-CM

## 2020-05-02 DIAGNOSIS — Z720 Tobacco use: Secondary | ICD-10-CM

## 2020-05-02 DIAGNOSIS — G4733 Obstructive sleep apnea (adult) (pediatric): Secondary | ICD-10-CM

## 2020-05-02 DIAGNOSIS — T753XXA Motion sickness, initial encounter: Secondary | ICD-10-CM

## 2020-05-02 DIAGNOSIS — M1990 Unspecified osteoarthritis, unspecified site: Secondary | ICD-10-CM

## 2020-05-02 DIAGNOSIS — J302 Other seasonal allergic rhinitis: Secondary | ICD-10-CM

## 2020-05-02 DIAGNOSIS — G5601 Carpal tunnel syndrome, right upper limb: Secondary | ICD-10-CM

## 2020-05-02 DIAGNOSIS — M549 Dorsalgia, unspecified: Secondary | ICD-10-CM

## 2020-05-02 DIAGNOSIS — Z411 Encounter for cosmetic surgery: Secondary | ICD-10-CM

## 2020-05-02 DIAGNOSIS — N3010 Interstitial cystitis (chronic) without hematuria: Secondary | ICD-10-CM

## 2020-05-02 DIAGNOSIS — E669 Obesity, unspecified: Secondary | ICD-10-CM

## 2020-05-02 DIAGNOSIS — N62 Hypertrophy of breast: Secondary | ICD-10-CM

## 2020-05-02 DIAGNOSIS — M79641 Pain in right hand: Secondary | ICD-10-CM

## 2020-05-02 DIAGNOSIS — E65 Localized adiposity: Secondary | ICD-10-CM

## 2020-05-02 NOTE — Progress Notes
Hand Therapy Orthosis/Evaluation and Plan of Care:     Name: Christina, Garrison: 07-23-2071  MRN#: [id number]  Referring Physician: Sherre Poot, MD  Insurance: Daphane Shepherd Date: months   Surgical Date: No surgery  Medical Diagnosis: Right [facility] OA  Treatment Diagnosis: Joint pain  Date of Initial Evaluation: 05-02-20  Visit #  1     Subjective:     History of Present Condition/Mechanism of Injury: gradual onset     Pain: Right:  Location: Thumb    Pain Rating:   Current: 4/10     Patient Goals: reduction in thumb pain     Objective:     Evaluation:    Hand Exam     Treatment:     Right:   Thumb     Treatment Provided:             Orthosis:     Prefabricated Orthosis Issued: Metagrip [facility] Brace -  Size 1     Purpose of Immobilization:To protect joint    Orthosis Instructions:  Verbally instructed in orthosis/brace care and precautions. Verbally instructed in wearing schedule for orthosis/brace. Wear splint at all times except hygiene for 3-4 weeks until pain subsides; then as needed at night and/or with activities.  Pt verbalized understanding.     Assessment:     Problems: Joint Pain    Rationale for Therapy:Clinical Judgment and Per Physician Order/Referral    Rehab Potential: Good     Contraindications to Therapy:none    Long Term Treatment Goals:    Patient will get relief from pain to improve healing and quality of life.    Short Term Treatment Goals:      Goal Number:  1  Goal: Patient to verbalize understanding of orthosis instructions including wearing schedule and precautions.  Goal Status:  Met     Plan Of Care:    Treatment Plan:     Treatment to be provided:Issue Prefabricated Orthosis    Frequency of treatment:  Will follow per physician request

## 2020-05-02 NOTE — Progress Notes
Subjective:       History of Present Illness  Christina Garrison is a 49 y.o. female.  Wants to discuss liposuction.   She wants to have her abdomen reduced.  Does not mind skin excess.  Is going to quit smoking today.      Review of Systems   Constitutional: Negative.    HENT: Negative.    Eyes: Negative.    Respiratory: Negative.    Cardiovascular: Negative.    Gastrointestinal: Negative.    Endocrine: Negative.    Genitourinary: Negative.    Musculoskeletal: Negative.    Skin: Negative.    Allergic/Immunologic: Negative.    Neurological: Positive for headaches.   Hematological: Negative.    Psychiatric/Behavioral: Negative.          Objective:         ? biotin 5,000 mcg TbDi Dissolve 2 tablets by mouth twice daily.   ? BYSTOLIC 5 mg tablet TAKE 1 TABLET BY MOUTH EVERY DAY   ? cholecalciferol (vitamin D3) (VITAMIN D3 PO) Take  by mouth every 48 hours.   ? clonazePAM (KLONOPIN) 0.5 mg tablet Take 1.5 tablets by mouth twice daily.   ? clonazePAM (KLONOPIN) 1 mg tablet Take one tablet by mouth as Needed (For driving).   ? cyanocobalamin (vitamin B-12) 2,500 mcg tab Take 1 tablet by mouth daily with breakfast.   ? diphenhydrAMINE (BENADRYL ALLERGY) 12.5 mg/5 mL oral solution Take 12.5 mg by mouth every 6 hours as needed.   ? divalproex (DEPAKOTE EC) 250 mg DR tablet TAKE 1 TABLET BY MOUTH TWICE A DAY WITH FOOD FOR BIPOLAR DEPRESSION   ? melatonin 3 mg tab Take one tablet by mouth at bedtime daily.   ? multivit, Ca, min-FA-soy isofl 400-60 mcg-mg tab Take 1 tablet by mouth twice daily.   ? nystatin/triamcinolone 100,000 unit/g / 0.1 % topical cream Apply  topically to affected area twice daily. Apply sparingly to area twice a day for 5 days followed by once a day for 5 days.   ? ondansetron (ZOFRAN ODT) 4 mg rapid dissolve tablet Dissolve one tablet by mouth every 8 hours as needed. Place on tongue to disolve.   ? pantoprazole DR (PROTONIX) 40 mg tablet TAKE 1 TABLET BY MOUTH EVERY DAY   ? POTASSIUM PO Take  by mouth twice weekly.   ? prochlorperazine maleate (COMPAZINE) 10 mg tablet Take one tablet by mouth every 6 hours as needed for Nausea or Vomiting.   ? rosuvastatin (CRESTOR) 20 mg tablet TAKE 1 TABLET BY MOUTH EVERY DAY   ? scopolamine (TRANSDERM-SCOP) 1.5 mg 3 day patch Apply one patch to top of skin as directed every 72 hours.   ? sertraline (ZOLOFT) 100 mg tablet Take one tablet by mouth daily. Indications: major depressive disorder   ? Verapamil 40 mg tab 20 mg in AM and 40 mg in PM   ? vitamins, multi w/minerals 9 mg iron-400 mcg tab Take 1 tablet by mouth daily with breakfast.     Vitals:    05/02/20 0954   BP: 105/71   BP Source: Arm, Left Upper   Patient Position: Sitting   Pulse: 59   Temp: 36.7 ?C (98 ?F)   TempSrc: Skin   Weight: 101.8 kg (224 lb 6.4 oz)   Height: 165.1 cm (65)   PainSc: Five     Body mass index is 37.34 kg/m?Marland Kitchen     Physical Exam  Vitals reviewed.   Constitutional:  Appearance: She is obese.   HENT:      Head: Normocephalic.   Cardiovascular:      Rate and Rhythm: Normal rate.   Chest:      Comments: Heavy, pendulous breasts.   Abdominal:      Palpations: Abdomen is soft.      Hernia: No hernia is present.      Comments: Excess adiposity of abdomen with intra-abdominal adiposity.   Lower abdomen pannus formation.  Adiposity of mons.               Assessment and Plan:    Plan: breast reduction with liposuction to abdomen/flanks, lateral chest wall.  Will email her the cost for the additional liposuction.  Still waiting to hear from insurance about her breast reduction.  Surgery is scheduled in November.   She would like to stay overnight for the breast reduction.  We discussed the 5 liter limit to lipo-aspirate. We will approach that number.     Photos taken today.   Discussed smoking cessation.   Counseling given: yes  Comment: Currently Vapes    No consent signed.  Was given [facility]PS consents.  Return for pre-op.    Problem List Items Addressed This Visit     Obesity (BMI 30-39.9)    Nicotine abuse    Hypertrophy of breast    Encounter for cosmetic surgery    Localized adiposity      Other Visit Diagnoses     Macromastia    -  Primary

## 2020-05-02 NOTE — Progress Notes
Date of Service: 05/02/2020    Subjective:             Christina Garrison is a 49 y.o. female.    History of Present Illness  Ms. Kiehn returns for follow-up.  She states that she has done great from the steroid injection the pain in the ulnar side of her palm has gone away.  Since that time, and perhaps even before she has noticed pain in the base of the thumb.  She has trouble opening jars and turning knobs.  The pain occasionally radiates proximally as well as into the index finger.  Otherwise have been no other changes in her health.     Review of Systems   Constitutional: Negative.    HENT: Negative.    Eyes: Negative.    Respiratory: Negative.    Cardiovascular: Negative.    Gastrointestinal: Negative.    Endocrine: Negative.    Genitourinary: Negative.    Musculoskeletal: Negative.    Skin: Negative.    Allergic/Immunologic: Negative.    Neurological: Negative.    Hematological: Negative.    Psychiatric/Behavioral: Negative.          Objective:         ? biotin 5,000 mcg TbDi Dissolve 2 tablets by mouth twice daily.   ? BYSTOLIC 5 mg tablet TAKE 1 TABLET BY MOUTH EVERY DAY   ? cholecalciferol (vitamin D3) (VITAMIN D3 PO) Take  by mouth every 48 hours.   ? clonazePAM (KLONOPIN) 0.5 mg tablet Take 1.5 tablets by mouth twice daily.   ? clonazePAM (KLONOPIN) 1 mg tablet Take one tablet by mouth as Needed (For driving).   ? cyanocobalamin (vitamin B-12) 2,500 mcg tab Take 1 tablet by mouth daily with breakfast.   ? diphenhydrAMINE (BENADRYL ALLERGY) 12.5 mg/5 mL oral solution Take 12.5 mg by mouth every 6 hours as needed.   ? divalproex (DEPAKOTE EC) 250 mg DR tablet TAKE 1 TABLET BY MOUTH TWICE A DAY WITH FOOD FOR BIPOLAR DEPRESSION   ? melatonin 3 mg tab Take one tablet by mouth at bedtime daily.   ? multivit, Ca, min-FA-soy isofl 400-60 mcg-mg tab Take 1 tablet by mouth twice daily.   ? nystatin/triamcinolone 100,000 unit/g / 0.1 % topical cream Apply  topically to affected area twice daily. Apply sparingly to area twice a day for 5 days followed by once a day for 5 days.   ? ondansetron (ZOFRAN ODT) 4 mg rapid dissolve tablet Dissolve one tablet by mouth every 8 hours as needed. Place on tongue to disolve.   ? pantoprazole DR (PROTONIX) 40 mg tablet TAKE 1 TABLET BY MOUTH EVERY DAY   ? POTASSIUM PO Take  by mouth twice weekly.   ? prochlorperazine maleate (COMPAZINE) 10 mg tablet Take one tablet by mouth every 6 hours as needed for Nausea or Vomiting.   ? rosuvastatin (CRESTOR) 20 mg tablet TAKE 1 TABLET BY MOUTH EVERY DAY   ? scopolamine (TRANSDERM-SCOP) 1.5 mg 3 day patch Apply one patch to top of skin as directed every 72 hours.   ? sertraline (ZOLOFT) 100 mg tablet Take one tablet by mouth daily. Indications: major depressive disorder   ? Verapamil 40 mg tab 20 mg in AM and 40 mg in PM   ? vitamins, multi w/minerals 9 mg iron-400 mcg tab Take 1 tablet by mouth daily with breakfast.     Vitals:    05/02/20 1331   BP: 121/74   Pulse: 71   Weight:  97.1 kg (214 lb)   Height: 165.1 cm (65)     Body mass index is 35.61 kg/m?Marland Kitchen     Physical Exam  Vitals and nursing note reviewed.   Constitutional:       Appearance: She is well-developed.   HENT:      Head: Normocephalic and atraumatic.   Eyes:      Conjunctiva/sclera: Conjunctivae normal.   Cardiovascular:      Rate and Rhythm: Normal rate.   Pulmonary:      Effort: Pulmonary effort is normal.   Musculoskeletal:         General: Normal range of motion.      Comments: Lamination of the right hand shows discomfort over the base of thumb.  Positive metacarpal grind which results in pain but no crepitus.  Nontender over the carpal tunnel.  Nontender over the hypothenar eminence.   Skin:     General: Skin is warm and dry.   Neurological:      Mental Status: She is alert and oriented to person, place, and time.   Psychiatric:         Behavior: Behavior normal.         Thought Content: Thought content normal.         Judgment: Judgment normal.              Assessment and Plan:  Doing well from her steroid injection and carpal tunnel release.  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  This was reviewed with her.  A right hand radiograph was ordered.  Order was placed for a MetaGrip/thumb-based splint.  She will wear this as needed and at night.  She can follow-up with me on a as needed basis.  She is happy with the plan.  All of her questions were answered.

## 2020-05-08 ENCOUNTER — Encounter: Admit: 2020-05-08 | Discharge: 2020-05-08 | Payer: PRIVATE HEALTH INSURANCE

## 2020-05-08 DIAGNOSIS — F418 Other specified anxiety disorders: Secondary | ICD-10-CM

## 2020-05-08 DIAGNOSIS — F4310 Post-traumatic stress disorder, unspecified: Secondary | ICD-10-CM

## 2020-05-08 DIAGNOSIS — F341 Dysthymic disorder: Secondary | ICD-10-CM

## 2020-05-08 MED ORDER — SERTRALINE 100 MG PO TAB
ORAL_TABLET | Freq: Every day | 0 refills
Start: 2020-05-08 — End: ?

## 2020-05-16 ENCOUNTER — Encounter: Admit: 2020-05-16 | Discharge: 2020-05-16 | Payer: PRIVATE HEALTH INSURANCE

## 2020-05-16 NOTE — Progress Notes
cosmetic quote emailed to pt

## 2020-06-01 ENCOUNTER — Encounter: Admit: 2020-06-01 | Discharge: 2020-06-01 | Payer: PRIVATE HEALTH INSURANCE

## 2020-06-01 DIAGNOSIS — F4310 Post-traumatic stress disorder, unspecified: Secondary | ICD-10-CM

## 2020-06-01 DIAGNOSIS — F341 Dysthymic disorder: Secondary | ICD-10-CM

## 2020-06-01 DIAGNOSIS — F418 Other specified anxiety disorders: Secondary | ICD-10-CM

## 2020-06-01 MED ORDER — SERTRALINE 100 MG PO TAB
ORAL_TABLET | Freq: Every day | 1 refills
Start: 2020-06-01 — End: ?

## 2020-06-07 ENCOUNTER — Encounter: Admit: 2020-06-07 | Discharge: 2020-06-07 | Payer: PRIVATE HEALTH INSURANCE

## 2020-06-07 DIAGNOSIS — R42 Dizziness and giddiness: Secondary | ICD-10-CM

## 2020-06-07 DIAGNOSIS — F99 Mental disorder, not otherwise specified: Secondary | ICD-10-CM

## 2020-06-07 DIAGNOSIS — E785 Hyperlipidemia, unspecified: Secondary | ICD-10-CM

## 2020-06-07 DIAGNOSIS — Z720 Tobacco use: Secondary | ICD-10-CM

## 2020-06-07 DIAGNOSIS — G43909 Migraine, unspecified, not intractable, without status migrainosus: Secondary | ICD-10-CM

## 2020-06-07 DIAGNOSIS — G4733 Obstructive sleep apnea (adult) (pediatric): Secondary | ICD-10-CM

## 2020-06-07 DIAGNOSIS — M1990 Unspecified osteoarthritis, unspecified site: Secondary | ICD-10-CM

## 2020-06-07 DIAGNOSIS — I10 Essential (primary) hypertension: Principal | ICD-10-CM

## 2020-06-07 DIAGNOSIS — R519 Generalized headaches: Secondary | ICD-10-CM

## 2020-06-07 DIAGNOSIS — N3010 Interstitial cystitis (chronic) without hematuria: Secondary | ICD-10-CM

## 2020-06-07 DIAGNOSIS — K859 Acute pancreatitis without necrosis or infection, unspecified: Secondary | ICD-10-CM

## 2020-06-07 DIAGNOSIS — M549 Dorsalgia, unspecified: Secondary | ICD-10-CM

## 2020-06-07 DIAGNOSIS — T753XXA Motion sickness, initial encounter: Secondary | ICD-10-CM

## 2020-06-07 DIAGNOSIS — E7800 Pure hypercholesterolemia, unspecified: Secondary | ICD-10-CM

## 2020-06-07 DIAGNOSIS — K219 Gastro-esophageal reflux disease without esophagitis: Secondary | ICD-10-CM

## 2020-06-07 DIAGNOSIS — J302 Other seasonal allergic rhinitis: Secondary | ICD-10-CM

## 2020-06-07 DIAGNOSIS — G5601 Carpal tunnel syndrome, right upper limb: Secondary | ICD-10-CM

## 2020-06-07 DIAGNOSIS — R079 Chest pain, unspecified: Secondary | ICD-10-CM

## 2020-06-07 DIAGNOSIS — R0600 Dyspnea, unspecified: Secondary | ICD-10-CM

## 2020-06-07 NOTE — Progress Notes
Date of Service: 06/07/2020    Christina Garrison is a 49 y.o. female.       HPI     Christina Garrison has been followed for difficult to control hypertension.? 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  Both her back discomfort and chest discomfort improved when she supports her chest and breasts.  She is scheduled for breast reduction with liposuction to abdomen/flanks, lateral chest wall on July 23, 2020.  She stopped vaping in early September 2021 in preparation for surgery..Christina Garrison?reports that her blood pressures been under good control when she checks it at home. ?Her blood pressure typically runs in the range of 120/70-80?mmHg.    The patient underwent left carpal tunnel surgery on 10/21/2019 and right carpal tunnel surgery on 12/27/2019 with good results.  Her headaches and her depression?are generally?under reasonable?good control. ?She is followed by gastroenterology for esophageal reflux and esophageal spasm.  She has occasional chest discomfort which is musculoskeletal and appears related to macromastia. In December 2020 she underwent a sleep study which was abnormal and she now uses CPAP. ?From a cardiovascular perspective, the patient has been doing well and reports no angina, congestive symptoms, palpitations, sensation of sustained forceful heart pounding, lightheadedness or syncope. Her exercise tolerance?is relatively stable and is limited by her lower back discomfort. ?She has been exercising for 30 minutes on the elliptical nearly daily she needs to stop because of increased back discomfort. She also walks her dogs for a mile daily.  The patient reports no myalgias, bleeding abnormalities, medication or strokelike symptoms.  However, she previously had mylgias while taking pravastatin. 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 ?Her verapamil has been decreased from 40 mg twice daily to 20 mg in the morning and 40 mg in the evening because of bradycardia.?  ?  Historically regarding the treatment of her hypertension, Christina Garrison reports that an ACE inhibitor previously caused a cough and initially nebivolol was ineffective. At one time she was taking a thiazide diuretic, but this was stopped when it was thought to possibly have contributed to an episode of pancreatitis on October 30, 2010. Amlodipine 5 mg daily was started with good results. Nebivolol 5 mg daily was restarted and she has tolerated this without difficulty. Her past medical history is noteworthy for interstitial cystitis. Christina Garrison underwent laparoscopic cholecystectomy on 05/01/11 with resolution of her recurrent abdominal discomfort.?She reports chronic problems with recurrent sinus infections. 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 Christina Garrison reports that her surgery was complicated by fluid retention and pulmonary congestion.  Christina Garrison is a prior cigarette smoker and had smoked a half-a-pack per day for 23 years. In September 2013 she stopped smoking.??She has hyperlipidemia, but is not known to be diabetic. She has a family history for coronary disease. In 02-24-2010 a brother died suddenly at age 20. Her father is still living, but has coronary disease at age 22. Her mother had coronary disease. Her maternal grandfather died from coronary disease at age 51 and her maternal grandmother died from coronary disease, also age 15. One sister has lung cancer and 2 siblings have high blood pressure. No siblings have documented coronary disease, but as mentioned, her brother died suddenly in 02/24/10  at age 73. ??She underwent open repair of supraumbilical hernia with mesh?on 09/23/2018 with excellent results.??On 06/03/2019 she received local pain injections for bilateral occipital neuralgia, myalgia, neuropathic pain, muscle spasm and myofascial pain. ?It appears that on June 04, 2019 she was seen for chest discomfort in [facility] and on?June 09, 2019 she presented to [facility] hospital with chest discomfort. ?There was no evidence for an acute coronary syndrome and stress testing was performed which showed no evidence of myocardial ischemia. ?Her chest discomfort appeared to be clearly musculoskeletal.          Vitals:    06/07/20 1515 06/07/20 1535   BP: 108/76 110/78   BP Source: Arm, Left Upper Arm, Right Upper   Patient Position: Sitting Sitting   Pulse: 66    SpO2: 97%    Weight: 97.1 kg (214 lb)    Height: 1.651 m (5' 5)    PainSc: Zero      Body mass index is 35.61 kg/m?Marland Kitchen     Past Medical History  Patient Active Problem List    Diagnosis Date Noted   ? Encounter for cosmetic surgery 05/02/2020   ? Localized adiposity 05/02/2020   ? Hypertrophy of breast 03/21/2020   ? Carpal tunnel syndrome on right 12/27/2019   ? Persistent depressive disorder 11/24/2019   ? Other specified anxiety disorders 11/24/2019   ? PTSD (post-traumatic stress disorder) 11/24/2019   ? Nicotine dependence 11/24/2019   ? Centrilobular emphysema (HCC) 10/27/2019   ? Nicotine abuse 10/27/2019   ? Chest pain 06/09/2019   ? Bilateral arm weakness 05/27/2019     Describes neck manipulation at chiropractor starting in May, bilateral arm weakness/numbness/pain that began one week ago, and ongoing symptoms of bi-occipital, posterior neck pain moving anteriorly since June 2020      CTA head and neck with and without contrast negative for any dissection, large vessel stenosis    No longer endorsing these symptoms, but does state post L carpal tunnel release surgery, she's been having pain in her left wrist      ? Bilateral occipital neuralgia 05/27/2019     Describes bilateral neck/occipital region pain that moves anteriorly. Tenderness to palpation on bilateral trap>occipital grooves.     Currently being seen by chronic anesthesiology for nerve blocks      ? Primary insomnia 03/18/2018   ? Nightmare 11/26/2015   ? 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 Described as a jerking sensation lasting seconds.     MRI brain with and without contrast negative    Evaluation by neuro-otologist did not find any pathological findings    Continues to endorse some intermittent dizziness      ? Obstructive sleep apnea syndrome 05/21/2015     PSG on 03/16/15: AHI 8.9 events per hour,The REM AHI was 21.3, supine REM AHI 37.? The overall supine AHI was 16 and lateral AHI was 0. Oxyhemoglobin saturation nadir of 84% and Time below 88% was 12.8 minutes.   PLMS 0/hr   12/19/2015 oximetry on 2 Liters, <88% 6 minutes.  Increase to 3 liters and recheck.  Much better headaches.     ? Obesity (BMI 30-39.9) 05/21/2015     Body mass index is 30.71 kg/(m^2).     ? Chronic insomnia 05/21/2015     - trouble with sleep maintenance   - Components of psychophysiologic insomnia and anxiety  - Has not used clonazepam for this, had leg cramps with lunesta  - poor sleep  hygiene     Follows with Dr. Andria Meuse, currently non compliant with CPAP but does use supplemental oxygen overnight and being worked up for adequate use      ? Sleep disorder 11/09/2014   ? 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 Hyperlipidemia 08/26/2012   ? Vertigo 08/11/2012     Spells for years of intermittent dizziness, rarely with vertigo but now described more in terms of altered cognition., initially positionally provoked, now lasting for only seconds.  Occurs daily currently.  Initially with feeling of L ear fullness and pain.  Has been associated with headaches as well  Does have a h/o falling down stairs hitting her head and neck 20 years ago  MRI wo/w + IAC 10/2012 without significant abnormalities  Neuro-otology workup with likely central cause of dizziness (normal audiogram and mostly normal VEMPs, normal electronystagmography, no canal dehiscence on imaging)  Has undergone PT for vestibular and cervical causes  EEG (routine) normal    Current treatment: Clonazepam 1mg  qAM, qPM, Depakote 250 BID (for mood), Acupuncture (has had some response)    Prior trials: Prozac (SE), Effexor (suicidal thoughts), Topamax (speech difficulties), nortriptyline, Indomethacin (helped headache but GI SE), Magnesium/Riboflavin/coQ10, Propranolol (changed to Bystolic), Verapamil.  Occipital block with poor tolerance due to lasting pain at injection site     ? Tension headache 08/11/2012     frontal/L eye pressure since her 30s with conjunctival injection/photo/phono.  Able to work through this without any medication.  Sleeps in positions that provoke neck/back pain.  Significant greater occipital tenderness on exam  Improved with treatment of OSA, and after occipital nerve blocks (though poorly tolerated as above)  History of frontal meningocele (resected), head trauma (fell down stairs)    Currently taking a swig of liquid tylenol x2 a week to help treat. Also taking migraine cocktail for it with mild improvement      ? Allergic rhinitis 03/29/2012     Pt is a 49 yo F who presents to ENT clinic for evaluation of her sinus allergies.  Patient states that she has a 1 year history of sinus pressure and congestion.  Usual she has congestion behind her eyes and in her cheeks.  + Facial pain.  Symptoms are worse in spring and summer.  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  Patient has taken claritin however it makes her dizzy. She is currently doing daily sinus rinses as well as flonase.  Currently she feels her sinuses are not that bad.    Patient has secondary complaint of dizziness for 1 year.  It is an off balance sensation when she is driving or bending over.  Lasts only momentarily. No sensation of the room spinning. No hearing loss, tinnitus. Some L ear fullness.     ? Chest pain of uncertain etiology 11/29/2010   ? Pancreatitis 11/21/2010     2/12 Hospitalization [facility].     ? Essential hypertension 11/21/2010         Review of Systems   Constitutional: Positive for malaise/fatigue.   HENT: Negative.    Eyes: Negative.    Cardiovascular: Negative.    Respiratory: Negative.    Endocrine: Negative.    Hematologic/Lymphatic: Negative.    Skin: Negative.    Musculoskeletal: Positive for back pain and neck pain.   Gastrointestinal: Negative.    Genitourinary: Negative.    Neurological: Positive for dizziness and headaches.   Psychiatric/Behavioral: Negative.    Allergic/Immunologic: Negative.  Physical Exam  GENERAL: The patient is well developed, well nourished, resting comfortably and in no distress. ?  HEENT: No abnormalities of the visible oro-nasopharynx, conjunctiva or sclera are noted.  NECK: There is no jugular venous distension. Carotids are palpable and without bruits. There is no thyroid enlargement.  Chest: Lung fields are clear to auscultation. There are no wheezes or crackles.??  CV: There is a regular rhythm. The first and second heart sounds are normal. There are no murmurs, gallops or rubs. Her apical heart rate is?64?BPM.  ABD: The abdomen is soft and supple with normal bowel sounds. There is no hepatosplenomegaly, ascites, tenderness, masses or bruits.  Neuro: There are no focal motor defects. Ambulation is normal. Cognitive function appears normal.  Ext:?There is no edema or evidence of deep vein thrombosis. Peripheral pulses are satisfactory. ?She is wearing a left wrist brace.  SKIN:?There are no rashes and no cellulitis  PSYCH:?The patient is calm, rationale and oriented    Cardiovascular Studies  A twelve-lead ECG was obtained on 06/07/2020 reveals normal sinus rhythm with a heart rate of 63 bpm.  There is no evidence of myocardial ischemia or infarction.    PET/CT stress study 06/10/2019:  Normal pharmacologic stress myocardial perfusion, flow quantification, and   function.   No significant coronary calcification.       Cardiovascular Health Factors  Vitals BP Readings from Last 3 Encounters:   06/07/20 110/78   05/02/20 121/74   05/02/20 105/71     Wt Readings from Last 3 Encounters:   06/07/20 97.1 kg (214 lb)   05/02/20 97.1 kg (214 lb)   05/02/20 101.8 kg (224 lb 6.4 oz)     BMI Readings from Last 3 Encounters:   06/07/20 35.61 kg/m?   05/02/20 35.61 kg/m?   05/02/20 37.34 kg/m?      Smoking Social History     Tobacco Use   Smoking Status Former Smoker   ? Packs/day: 1.50   ? Years: 30.00   ? Pack years: 45.00   ? Types: Cigarettes   ? Quit date: 05/02/2012   ? Years since quitting: 8.1   Smokeless Tobacco Never Used   Tobacco Comment    Currently Vapes      Lipid Profile Cholesterol   Date Value Ref Range Status   06/16/2019 134  Final     HDL   Date Value Ref Range Status   06/16/2019 44  Final     LDL   Date Value Ref Range Status   06/16/2019 69  Final     Triglycerides   Date Value Ref Range Status   06/16/2019 107  Final      Blood Sugar Hemoglobin A1C   Date Value Ref Range Status   07/02/2011 5.9  Final     Glucose   Date Value Ref Range Status   06/09/2019 76 70 - 100 MG/DL Final   16/06/9603 89 70 - 100 MG/DL Final   54/05/8118 99  Final   08/07/2017 99  Final          Problems Addressed Today  No diagnosis found.    Assessment and Plan     Christina Garrison appears stable from a cardiovascular perspective.  She has chronic chest/back discomfort which clearly appears to be musculoskeletal and related to macromastia.   Most likely breast reduction surgery will be helpful in reducing her chest/back discomfort and may improve her physical conditioning and cardiovascular stability.  In order to further evaluate her  risk for cardiovascular morbidity and mortality associated with non-cardiac surgery, I have asked her to obtain an echo-doppler study to assess for structural cardiac abnormalities prior to surgery. Her surgical risk can then be further addressed after this study has been obtained.  Otherwise her blood pressure and lipid profile appear to be under very good control. I have asked her to return for follow-up in?6?months. The total time spent during this interview and exam was 40 minutes.An estimated counseling time of 30 minutes was spent regarding cardiovascular preoperative evaluation.         Current Medications (including today's revisions)  ? biotin 5,000 mcg TbDi Dissolve 2 tablets by mouth twice daily.   ? BYSTOLIC 5 mg tablet TAKE 1 TABLET BY MOUTH EVERY DAY   ? cholecalciferol (vitamin D3) (VITAMIN D3 PO) Take  by mouth every 48 hours.   ? clonazePAM (KLONOPIN) 0.5 mg tablet Take 1.5 tablets by mouth twice daily.   ? clonazePAM (KLONOPIN) 1 mg tablet Take one tablet by mouth as Needed (For driving).   ? cyanocobalamin (vitamin B-12) 2,500 mcg tab Take 1 tablet by mouth daily with breakfast.   ? diphenhydrAMINE (BENADRYL ALLERGY) 12.5 mg/5 mL oral solution Take 12.5 mg by mouth every 6 hours as needed.   ? divalproex (DEPAKOTE EC) 250 mg DR tablet TAKE 1 TABLET BY MOUTH TWICE A DAY WITH FOOD FOR BIPOLAR DEPRESSION   ? melatonin 3 mg tab Take one tablet by mouth at bedtime daily. (Patient taking differently: Take 3 mg by mouth at bedtime as needed.)   ? multivit, Ca, min-FA-soy isofl 400-60 mcg-mg tab Take 1 tablet by mouth twice daily.   ? nystatin/triamcinolone 100,000 unit/g / 0.1 % topical cream Apply  topically to affected area twice daily. Apply sparingly to area twice a day for 5 days followed by once a day for 5 days.   ? ondansetron (ZOFRAN ODT) 4 mg rapid dissolve tablet Dissolve one tablet by mouth every 8 hours as needed. Place on tongue to disolve.   ? pantoprazole DR (PROTONIX) 40 mg tablet TAKE 1 TABLET BY MOUTH EVERY DAY (Patient taking differently: Take 40 mg by mouth as Needed.)   ? POTASSIUM PO Take  by mouth twice weekly.   ? prochlorperazine maleate (COMPAZINE) 10 mg tablet Take one tablet by mouth every 6 hours as needed for Nausea or Vomiting.   ? rosuvastatin (CRESTOR) 20 mg tablet TAKE 1 TABLET BY MOUTH EVERY DAY   ? scopolamine (TRANSDERM-SCOP) 1.5 mg 3 day patch Apply one patch to top of skin as directed every 72 hours. (Patient taking differently: Apply 1 patch to top of skin as directed as Needed.)   ? sertraline (ZOLOFT) 100 mg tablet TAKE 1 TABLET BY MOUTH EVERY DAY FOR DEPRESSION   ? Verapamil 40 mg tab 20 mg in AM and 40 mg in PM   ? vitamins, multi w/minerals 9 mg iron-400 mcg tab Take 1 tablet by mouth daily with breakfast.

## 2020-06-07 NOTE — Patient Instructions
Echo   Follow up in 6 months.

## 2020-06-21 ENCOUNTER — Ambulatory Visit: Admit: 2020-06-21 | Discharge: 2020-06-21 | Payer: Commercial Managed Care - HMO

## 2020-06-21 ENCOUNTER — Ambulatory Visit: Admit: 2020-06-21 | Discharge: 2020-06-22 | Payer: Commercial Managed Care - HMO

## 2020-06-21 ENCOUNTER — Encounter: Admit: 2020-06-21 | Discharge: 2020-06-21 | Payer: PRIVATE HEALTH INSURANCE

## 2020-06-21 ENCOUNTER — Ambulatory Visit: Admit: 2020-06-21 | Discharge: 2020-06-21 | Payer: PRIVATE HEALTH INSURANCE

## 2020-06-21 DIAGNOSIS — M1990 Unspecified osteoarthritis, unspecified site: Secondary | ICD-10-CM

## 2020-06-21 DIAGNOSIS — R519 Generalized headaches: Secondary | ICD-10-CM

## 2020-06-21 DIAGNOSIS — K859 Acute pancreatitis without necrosis or infection, unspecified: Secondary | ICD-10-CM

## 2020-06-21 DIAGNOSIS — R0600 Dyspnea, unspecified: Secondary | ICD-10-CM

## 2020-06-21 DIAGNOSIS — T753XXA Motion sickness, initial encounter: Secondary | ICD-10-CM

## 2020-06-21 DIAGNOSIS — J302 Other seasonal allergic rhinitis: Secondary | ICD-10-CM

## 2020-06-21 DIAGNOSIS — I10 Essential (primary) hypertension: Secondary | ICD-10-CM

## 2020-06-21 DIAGNOSIS — R079 Chest pain, unspecified: Secondary | ICD-10-CM

## 2020-06-21 DIAGNOSIS — F99 Mental disorder, not otherwise specified: Secondary | ICD-10-CM

## 2020-06-21 DIAGNOSIS — G4733 Obstructive sleep apnea (adult) (pediatric): Secondary | ICD-10-CM

## 2020-06-21 DIAGNOSIS — G5601 Carpal tunnel syndrome, right upper limb: Secondary | ICD-10-CM

## 2020-06-21 DIAGNOSIS — M549 Dorsalgia, unspecified: Secondary | ICD-10-CM

## 2020-06-21 DIAGNOSIS — K219 Gastro-esophageal reflux disease without esophagitis: Secondary | ICD-10-CM

## 2020-06-21 DIAGNOSIS — Z720 Tobacco use: Secondary | ICD-10-CM

## 2020-06-21 DIAGNOSIS — E785 Hyperlipidemia, unspecified: Secondary | ICD-10-CM

## 2020-06-21 DIAGNOSIS — G43909 Migraine, unspecified, not intractable, without status migrainosus: Secondary | ICD-10-CM

## 2020-06-21 DIAGNOSIS — E7800 Pure hypercholesterolemia, unspecified: Secondary | ICD-10-CM

## 2020-06-21 DIAGNOSIS — N3010 Interstitial cystitis (chronic) without hematuria: Secondary | ICD-10-CM

## 2020-06-21 DIAGNOSIS — R42 Dizziness and giddiness: Secondary | ICD-10-CM

## 2020-06-21 MED ORDER — CLONAZEPAM 1 MG PO TAB
1 mg | ORAL_TABLET | ORAL | 5 refills | Status: AC | PRN
Start: 2020-06-21 — End: ?

## 2020-06-21 MED ORDER — SERTRALINE 25 MG PO TAB
25 mg | ORAL_TABLET | Freq: Every day | ORAL | 3 refills | Status: AC
Start: 2020-06-21 — End: ?

## 2020-06-21 MED ORDER — CLONAZEPAM 0.5 MG PO TAB
.75 mg | ORAL_TABLET | Freq: Two times a day (BID) | ORAL | 5 refills | Status: AC
Start: 2020-06-21 — End: ?

## 2020-06-21 MED ORDER — PROCHLORPERAZINE MALEATE 10 MG PO TAB
10 mg | ORAL_TABLET | ORAL | 5 refills | Status: AC | PRN
Start: 2020-06-21 — End: ?

## 2020-06-21 MED ADMIN — PERFLUTREN LIPID MICROSPHERES 1.1 MG/ML IV SUSP [79178]: 2 mL | INTRAVENOUS | @ 14:00:00 | Stop: 2020-06-21 | NDC 11994001116

## 2020-06-21 NOTE — Patient Instructions
-   discuss starting amitryptiline with psych, can increase every few weeks if tolerated

## 2020-06-21 NOTE — Telephone Encounter
Results and recommendations called to patient. Patient has no questions at this time. Office note routed to patient's plastic surgeon's office.

## 2020-06-21 NOTE — Patient Instructions
-   Your sertraline will be reduced to 75mg  daily  - I will follow up with neurology so they can start amitriptyline for your headache  - A referral for psychotherapy will be placed

## 2020-06-21 NOTE — Telephone Encounter
-----   Message from Hester Mates, MD sent at 06/21/2020  3:18 PM CDT -----  To all: Ms. Biddy echo Doppler study from 06/21/2020 looks favorable.  Please let her know.  Her preoperative evaluation has been completed and appears as an addendum to my clinic note dated 06/07/2020.  Please send to her surgical anesthesia teams.  Thanks.  SBG  ----- Message -----  From: Levora Angel, MD  Sent: 06/21/2020  10:58 AM CDT  To: Hester Mates, MD

## 2020-06-22 ENCOUNTER — Encounter: Admit: 2020-06-22 | Discharge: 2020-06-22 | Payer: PRIVATE HEALTH INSURANCE

## 2020-06-22 NOTE — Telephone Encounter
-----   Message from Hester Mates, MD sent at 06/21/2020  3:18 PM CDT -----  To all: Ms. Biddy echo Doppler study from 06/21/2020 looks favorable.  Please let her know.  Her preoperative evaluation has been completed and appears as an addendum to my clinic note dated 06/07/2020.  Please send to her surgical anesthesia teams.  Thanks.  SBG  ----- Message -----  From: Levora Angel, MD  Sent: 06/21/2020  10:58 AM CDT  To: Hester Mates, MD

## 2020-06-22 NOTE — Telephone Encounter
Results and recommendations called to patient.

## 2020-06-27 ENCOUNTER — Encounter: Admit: 2020-06-27 | Discharge: 2020-06-27 | Payer: PRIVATE HEALTH INSURANCE

## 2020-06-27 NOTE — Telephone Encounter
called and scheduled peer to peer with insurance company  called pt to let her know this has been scheduled

## 2020-07-04 ENCOUNTER — Ambulatory Visit: Admit: 2020-07-04 | Discharge: 2020-07-05 | Payer: Commercial Managed Care - HMO

## 2020-07-04 ENCOUNTER — Encounter: Admit: 2020-07-04 | Discharge: 2020-07-04 | Payer: PRIVATE HEALTH INSURANCE

## 2020-07-04 DIAGNOSIS — I10 Essential (primary) hypertension: Secondary | ICD-10-CM

## 2020-07-04 DIAGNOSIS — Z720 Tobacco use: Secondary | ICD-10-CM

## 2020-07-04 DIAGNOSIS — K219 Gastro-esophageal reflux disease without esophagitis: Secondary | ICD-10-CM

## 2020-07-04 DIAGNOSIS — R519 Generalized headaches: Secondary | ICD-10-CM

## 2020-07-04 DIAGNOSIS — K859 Acute pancreatitis without necrosis or infection, unspecified: Secondary | ICD-10-CM

## 2020-07-04 DIAGNOSIS — R42 Dizziness and giddiness: Secondary | ICD-10-CM

## 2020-07-04 DIAGNOSIS — T753XXA Motion sickness, initial encounter: Secondary | ICD-10-CM

## 2020-07-04 DIAGNOSIS — E669 Obesity, unspecified: Secondary | ICD-10-CM

## 2020-07-04 DIAGNOSIS — E7800 Pure hypercholesterolemia, unspecified: Secondary | ICD-10-CM

## 2020-07-04 DIAGNOSIS — N62 Hypertrophy of breast: Secondary | ICD-10-CM

## 2020-07-04 DIAGNOSIS — G4733 Obstructive sleep apnea (adult) (pediatric): Secondary | ICD-10-CM

## 2020-07-04 DIAGNOSIS — G5601 Carpal tunnel syndrome, right upper limb: Secondary | ICD-10-CM

## 2020-07-04 DIAGNOSIS — M1990 Unspecified osteoarthritis, unspecified site: Secondary | ICD-10-CM

## 2020-07-04 DIAGNOSIS — R0600 Dyspnea, unspecified: Secondary | ICD-10-CM

## 2020-07-04 DIAGNOSIS — J302 Other seasonal allergic rhinitis: Secondary | ICD-10-CM

## 2020-07-04 DIAGNOSIS — F99 Mental disorder, not otherwise specified: Secondary | ICD-10-CM

## 2020-07-04 DIAGNOSIS — M549 Dorsalgia, unspecified: Secondary | ICD-10-CM

## 2020-07-04 DIAGNOSIS — N3010 Interstitial cystitis (chronic) without hematuria: Secondary | ICD-10-CM

## 2020-07-04 DIAGNOSIS — G43909 Migraine, unspecified, not intractable, without status migrainosus: Secondary | ICD-10-CM

## 2020-07-04 MED ORDER — OXYCODONE 5 MG PO TAB
5 mg | ORAL_TABLET | ORAL | 0 refills | 6.00000 days | Status: AC | PRN
Start: 2020-07-04 — End: ?

## 2020-07-04 MED ORDER — ONDANSETRON 4 MG PO TBDI
4 mg | ORAL_TABLET | ORAL | 0 refills | 8.00000 days | Status: AC | PRN
Start: 2020-07-04 — End: ?

## 2020-07-04 NOTE — Progress Notes
Subjective:       History of Present Illness  Christina Garrison is a 49 y.o. female.  Pre-op for breast reduction.  She quit vaping in early September.  I did a peer to peer review today and the surgery was approved if I remove more than 700 grams on the smaller side.        Review of Systems   Constitutional: Negative.    HENT: Positive for sinus pain.    Eyes: Negative.    Respiratory: Negative.    Cardiovascular: Negative.    Gastrointestinal: Negative.    Endocrine: Negative.    Genitourinary: Negative.    Musculoskeletal: Positive for neck pain and neck stiffness.   Skin: Negative.    Allergic/Immunologic: Positive for environmental allergies.   Neurological: Negative.    Hematological: Negative.    Psychiatric/Behavioral: The patient is nervous/anxious.          Objective:         ? biotin 5,000 mcg TbDi Dissolve 2 tablets by mouth twice daily.   ? BYSTOLIC 5 mg tablet TAKE 1 TABLET BY MOUTH EVERY DAY   ? cholecalciferol (vitamin D3) (VITAMIN D3 PO) Take  by mouth every 48 hours.   ? clonazePAM (KLONOPIN) 0.5 mg tablet Take 1.5 tablets by mouth twice daily.   ? clonazePAM (KLONOPIN) 1 mg tablet Take one tablet by mouth as Needed (For driving).   ? cyanocobalamin (vitamin B-12) 2,500 mcg tab Take 1 tablet by mouth daily with breakfast.   ? diphenhydrAMINE (BENADRYL ALLERGY) 12.5 mg/5 mL oral solution Take 12.5 mg by mouth every 6 hours as needed.   ? divalproex (DEPAKOTE EC) 250 mg DR tablet TAKE 1 TABLET BY MOUTH TWICE A DAY WITH FOOD FOR BIPOLAR DEPRESSION   ? melatonin 3 mg tab Take one tablet by mouth at bedtime daily. (Patient taking differently: Take 3 mg by mouth at bedtime as needed.)   ? multivit, Ca, min-FA-soy isofl 400-60 mcg-mg tab Take 1 tablet by mouth twice daily.   ? nystatin/triamcinolone 100,000 unit/g / 0.1 % topical cream Apply  topically to affected area twice daily. Apply sparingly to area twice a day for 5 days followed by once a day for 5 days.   ? ondansetron (ZOFRAN ODT) 4 mg rapid dissolve tablet Dissolve one tablet by mouth every 8 hours as needed. Place on tongue to disolve.   ? pantoprazole DR (PROTONIX) 40 mg tablet TAKE 1 TABLET BY MOUTH EVERY DAY (Patient taking differently: Take 40 mg by mouth as Needed.)   ? POTASSIUM PO Take  by mouth twice weekly.   ? prochlorperazine maleate (COMPAZINE) 10 mg tablet Take one tablet by mouth every 6 hours as needed for Nausea or Vomiting.   ? rosuvastatin (CRESTOR) 20 mg tablet TAKE 1 TABLET BY MOUTH EVERY DAY   ? scopolamine (TRANSDERM-SCOP) 1.5 mg 3 day patch Apply one patch to top of skin as directed every 72 hours. (Patient taking differently: Apply 1 patch to top of skin as directed as Needed.)   ? sertraline (ZOLOFT) 100 mg tablet TAKE 1 TABLET BY MOUTH EVERY DAY FOR DEPRESSION   ? sertraline (ZOLOFT) 25 mg tablet Take one tablet by mouth daily. For a total of 75mg  daily.  Indications: anxiousness associated with depression   ? Verapamil 40 mg tab 20 mg in AM and 40 mg in PM   ? vitamins, multi w/minerals 9 mg iron-400 mcg tab Take 1 tablet by mouth daily with breakfast.  Vitals:    07/04/20 1428   BP: 112/77   Pulse: 60   Temp: 36.6 ?C (97.8 ?F)   TempSrc: Skin   Weight: 105.2 kg (231 lb 14.4 oz)   Height: 165.1 cm (65)   PainSc: Zero     Body mass index is 38.59 kg/m?Marland Kitchen     Physical Exam  Vitals reviewed.   Constitutional:       Appearance: She is obese.   HENT:      Head: Normocephalic.   Cardiovascular:      Rate and Rhythm: Normal rate.   Chest:      Comments: Heavy, pendulous breasts.     Breast measurements:         Sternal notch to nipple:   R 35cm  L 37cm   Inframammary to nipple: R 14cm L 16cm   Breast base width:   R 16cm L 16cm    Grade 3 breast ptosis bilaterally.    diminished light touch nipple sensation bilaterally.    Abdominal:      Palpations: Abdomen is soft.      Hernia: No hernia is present.      Comments: Excess adiposity of abdomen with intra-abdominal adiposity.   Lower abdomen pannus formation.  Adiposity of mons. Musculoskeletal:      Comments: Lateral chest wall adiposity.               Assessment and Plan:    Plan: bilateral breast reduction possible free nipple graft. Liposuction to abdomen/flanks/mons/lateral chest wall.     I explained the risks of surgery including bleeding, infection, delayed wound healing, altered sensation of her nipple and areola, scarring and asymmetry.    Described the free nipple graft procedure with the associated numbness, hypopigmentation, and inability to breast feed.    Discussed the inferior pedicle reduction and Wise pattern.    Asked her to stop using Biotin. She quit vaping.     Gave her the prescriptions for after surgery, oxycodone, zofran. Sent to [facility].  Signed consent for surgery today.    Patient is staying extended recovery.    We discussed her goal to have a B cup.  I demonstrated for her what her breast volume will approximate.  Explained I cannot guarantee the cup size.    Discussed patient's BMI with her.  The body mass index is 38.59 kg/m?Marland Kitchen and falls within the category of Obesity 2 (35 to <40); counseled regarding weight loss.     The patient's questions and concerns regarding surgery were answered.  She was given the [facility] consent form to take home and read.        Problem List Items Addressed This Visit     Obesity (BMI 30-39.9)    Nicotine abuse      Other Visit Diagnoses     Macromastia    -  Primary

## 2020-07-04 NOTE — Telephone Encounter
Peer to peer review for breast reduction.   Her insurance requires that her weight removal is at least 717 grams per side. If I am able to get 700 grams from the right side, then it would be covered. I am planning on getting 750 grams on the left.   We also discussed that if she lost weight, then she would need a lower weight removal. This is something I would need to address with the patient.     I agree to get 700 grams from the right and 750 grams on the left.    They will then cover surgery.  The patient still needs to quit vaping for surgery.

## 2020-07-09 ENCOUNTER — Encounter: Admit: 2020-07-09 | Discharge: 2020-07-09 | Payer: PRIVATE HEALTH INSURANCE

## 2020-07-09 DIAGNOSIS — N62 Hypertrophy of breast: Secondary | ICD-10-CM

## 2020-07-09 NOTE — Telephone Encounter
Pt states broke a crown and sees a specialist next Wednesday with possibility of having oral procedure on Friday 11/19 before surgery on 11/22. I sent message to Dr. Erlene Quan and left vm message to let pt know Dr. Erlene Quan would be notified.

## 2020-07-10 ENCOUNTER — Encounter: Admit: 2020-07-10 | Discharge: 2020-07-10 | Payer: PRIVATE HEALTH INSURANCE

## 2020-07-13 ENCOUNTER — Encounter: Admit: 2020-07-13 | Discharge: 2020-07-13 | Payer: PRIVATE HEALTH INSURANCE

## 2020-07-13 ENCOUNTER — Ambulatory Visit: Admit: 2020-07-13 | Discharge: 2020-07-13 | Payer: Commercial Managed Care - HMO

## 2020-07-13 DIAGNOSIS — M1990 Unspecified osteoarthritis, unspecified site: Secondary | ICD-10-CM

## 2020-07-13 DIAGNOSIS — J302 Other seasonal allergic rhinitis: Secondary | ICD-10-CM

## 2020-07-13 DIAGNOSIS — I10 Essential (primary) hypertension: Secondary | ICD-10-CM

## 2020-07-13 DIAGNOSIS — G5601 Carpal tunnel syndrome, right upper limb: Secondary | ICD-10-CM

## 2020-07-13 DIAGNOSIS — N3010 Interstitial cystitis (chronic) without hematuria: Secondary | ICD-10-CM

## 2020-07-13 DIAGNOSIS — Z720 Tobacco use: Secondary | ICD-10-CM

## 2020-07-13 DIAGNOSIS — E7800 Pure hypercholesterolemia, unspecified: Secondary | ICD-10-CM

## 2020-07-13 DIAGNOSIS — K859 Acute pancreatitis without necrosis or infection, unspecified: Secondary | ICD-10-CM

## 2020-07-13 DIAGNOSIS — M549 Dorsalgia, unspecified: Secondary | ICD-10-CM

## 2020-07-13 DIAGNOSIS — G43909 Migraine, unspecified, not intractable, without status migrainosus: Secondary | ICD-10-CM

## 2020-07-13 DIAGNOSIS — G4733 Obstructive sleep apnea (adult) (pediatric): Secondary | ICD-10-CM

## 2020-07-13 DIAGNOSIS — Z01818 Encounter for other preprocedural examination: Secondary | ICD-10-CM

## 2020-07-13 DIAGNOSIS — R42 Dizziness and giddiness: Secondary | ICD-10-CM

## 2020-07-13 DIAGNOSIS — R519 Generalized headaches: Secondary | ICD-10-CM

## 2020-07-13 DIAGNOSIS — R0600 Dyspnea, unspecified: Secondary | ICD-10-CM

## 2020-07-13 DIAGNOSIS — J449 Chronic obstructive pulmonary disease, unspecified: Secondary | ICD-10-CM

## 2020-07-13 DIAGNOSIS — T753XXA Motion sickness, initial encounter: Secondary | ICD-10-CM

## 2020-07-13 DIAGNOSIS — F99 Mental disorder, not otherwise specified: Secondary | ICD-10-CM

## 2020-07-13 LAB — COMPREHENSIVE METABOLIC PANEL
Lab: 0.4 mg/dL (ref 0.3–1.2)
Lab: 0.5 mg/dL (ref 0.4–1.00)
Lab: 10 (ref 3–12)
Lab: 103 MMOL/L (ref 98–110)
Lab: 139 MMOL/L (ref 137–147)
Lab: 15 U/L (ref 7–40)
Lab: 15 mg/dL (ref 7–25)
Lab: 16 U/L (ref 7–56)
Lab: 26 MMOL/L (ref 21–30)
Lab: 3.9 MMOL/L (ref 3.5–5.1)
Lab: 4 g/dL (ref 3.5–5.0)
Lab: 6 g/dL (ref 6.0–8.0)
Lab: 73 mg/dL — ABNORMAL HIGH (ref 70–100)
Lab: 79 U/L (ref 25–110)
Lab: 8.9 mg/dL (ref 8.5–10.6)
Lab: >60 mL/min (ref >60)
Lab: >60 mL/min (ref >60)

## 2020-07-13 LAB — CBC: Lab: 11 K/UL — ABNORMAL HIGH (ref 4.5–11.0)

## 2020-07-13 NOTE — Anesthesia Pre-Procedure Evaluation
Anesthesia Pre-Procedure Evaluation    Name: Christina Garrison      MRN: [id number]     DOB: [birth year]     Age: 49 y.o.     Sex: female   _________________________________________________________________________     Procedure Info:   Procedure Information     Date/Time: 07/23/20 0830    Procedures:       MAMMAPLASTY REDUCTION (INSURANCE) (Bilateral ) - CASE LENGTH 3.5 HOURS      LATERAL CHEST WALL LIPOSUCTION  (COSMETIC) LIPOSUCTION TO ABDOMEN AND FLANKS (Bilateral )    Location: MAIN OR 16 / Main OR/Periop    Surgeons: Stevenson Clinch, MD          Physical Assessment  Vital Signs (last filed in past 24 hours):  BP: 119/69 (11/12 1606)  Temp: 36.6 ?C (97.8 ?F) (11/12 1606)  Pulse: 57 (11/12 1606)  Respirations: 17 PER MINUTE (11/12 1606)  SpO2: 98 % (11/12 1606)  Height: 165.1 cm (65) (11/12 1606)  Weight: 105.5 kg (232 lb 9.6 oz) (11/12 1606)      Patient History   Allergies   Allergen Reactions   ? Effexor [Venlafaxine] SEE COMMENTS     Suicidal thoughts   ? Strawberry ANAPHYLAXIS and HIVES   ? Diovan [Valsartan] HIVES   ? Prozac [Fluoxetine] NAUSEA AND VOMITING and ANXIETY   ? Topamax [Topiramate] SEE COMMENTS     Feels like she is going to pass out and speech messed up   ? Decadron [Dexamethasone] NAUSEA AND VOMITING and SEE COMMENTS     Also causes agitation    ? Gabapentin SEE COMMENTS     She can't remember what the reaction was but doesn't want to take it again.        Current Medications    Medication Directions   biotin 5,000 mcg TbDi Dissolve 2 tablets by mouth twice daily.   BYSTOLIC 5 mg tablet TAKE 1 TABLET BY MOUTH EVERY DAY  Patient taking differently: Take 5 mg by mouth daily with dinner.   cholecalciferol (VITAMIN D-3) 1,000 units tablet Take 1,000 Units by mouth twice weekly.   clonazePAM (KLONOPIN) 0.5 mg tablet Take 1.5 tablets by mouth twice daily.  Patient taking differently: Take 0.75 mg by mouth daily.   clonazePAM (KLONOPIN) 1 mg tablet Take one tablet by mouth as Needed (For driving).  Patient taking differently: Take 1 mg by mouth daily with dinner.   cyanocobalamin (vitamin B-12) 2,500 mcg tab Take 1 tablet by mouth daily with breakfast.   diphenhydrAMINE (BENADRYL ALLERGY) 12.5 mg/5 mL oral solution Take 12.5 mg by mouth every 6 hours as needed.   divalproex (DEPAKOTE EC) 250 mg DR tablet TAKE 1 TABLET BY MOUTH TWICE A DAY WITH FOOD FOR BIPOLAR DEPRESSION   famotidine (PEPCID) 20 mg tablet Take 20 mg by mouth daily as needed.   melatonin 3 mg tab Take one tablet by mouth at bedtime daily.  Patient not taking: Reported on 07/13/2020   multivit, Ca, min-FA-soy isofl 400-60 mcg-mg tab Take 1 tablet by mouth twice daily.   nystatin/triamcinolone 100,000 unit/g / 0.1 % topical cream Apply  topically to affected area twice daily. Apply sparingly to area twice a day for 5 days followed by once a day for 5 days.  Patient taking differently: Apply  topically to affected area twice daily as needed. Apply sparingly to area twice a day for 5 days followed by once a day for 5 days.   ondansetron (ZOFRAN ODT) 4  mg rapid dissolve tablet Dissolve one tablet by mouth every 8 hours as needed. Place on tongue to disolve.   oxyCODONE (ROXICODONE) 5 mg tablet Take one tablet by mouth every 4 hours as needed for Pain   pantoprazole DR (PROTONIX) 40 mg tablet TAKE 1 TABLET BY MOUTH EVERY DAY  Patient taking differently: Take 40 mg by mouth as Needed.   Potassium 99 mg tab Take 1 tablet by mouth three times weekly.   prochlorperazine maleate (COMPAZINE) 10 mg tablet Take one tablet by mouth every 6 hours as needed for Nausea or Vomiting.   rosuvastatin (CRESTOR) 20 mg tablet TAKE 1 TABLET BY MOUTH EVERY DAY  Patient taking differently: Take 20 mg by mouth at bedtime daily.   scopolamine (TRANSDERM-SCOP) 1.5 mg 3 day patch Apply one patch to top of skin as directed every 72 hours.  Patient taking differently: Apply 1 patch to top of skin as directed as Needed.   sertraline (ZOLOFT) 100 mg tablet TAKE 1 TABLET BY MOUTH EVERY DAY FOR DEPRESSION  Patient taking differently: Take 50 mg by mouth daily.   sertraline (ZOLOFT) 25 mg tablet Take one tablet by mouth daily. For a total of 75mg  daily.  Indications: anxiousness associated with depression   Verapamil 40 mg tab 20 mg in AM and 40 mg in PM  Patient taking differently: Take 40 mg by mouth at bedtime daily.   vitamins, multi w/minerals 9 mg iron-400 mcg tab Take 1 tablet by mouth daily with breakfast.         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), mild       No recent URI      Shortness of breath        Sleep apnea          Interventions: BiPAP; compliant      Cardiovascular         Exercise tolerance: >4 METS       Beta Blocker therapy: Yes        Hypertension, well controlled      No hx of coronary artery disease      No palpitations      No angina      Hyperlipidemia (statin)      No orthopnea      No dyspnea on exertion      GI/Hepatic/Renal           GERD (PPI), well controlled      No hx of liver disease     No renal disease      No nausea      Neuro/Psych       No seizures      No CVA      Headaches      No chronic opioid use      Chronic benzodiazepine use (clonazepam daily)      No indications/hx of sensory deficit        Psychiatric history  Depression          Anxiety      Musculoskeletal         Neck pain      Back pain      No arthritis      Endocrine/Other - negative      No diabetes      No hypothyroidism      No anemia      No autoimmune disease      Obesity    Constitution - negative   Physical Exam    Airway Findings      Mallampati: II      TM distance: >3 FB      Neck ROM: full      Mouth opening: good      Airway patency: adequate    Dental Findings: Negative            Cardiovascular Findings:       Rhythm: regular      Rate: normal      No murmur, no carotid bruit, no peripheral edema    Pulmonary Findings:       Breath sounds clear to auscultation.    Abdominal Findings:       Obese      Abdominal exam deferred    Neurological Findings:       Alert and oriented x 3    Constitutional findings:       No acute distress      Well-developed      Well-nourished       Diagnostic Tests  Hematology:   Lab Results   Component Value Date    HGB 15.0 06/09/2019    HCT 45.2 06/09/2019    PLTCT 290 06/09/2019    WBC 12.0 06/09/2019    NEUT 60 06/09/2019    ANC 7.23 06/09/2019    ALC 3.78 06/09/2019    MONA 7 06/09/2019    AMC 0.79 06/09/2019    EOSA 1 06/09/2019    ABC 0.07 06/09/2019    MCV 101.8 06/09/2019    MCH 33.8 06/09/2019    MCHC 33.2 06/09/2019    MPV 9.4 06/09/2019    RDW 12.4 06/09/2019         General Chemistry:   Lab Results   Component Value Date    NA 141 06/09/2019    K 3.5 06/09/2019    CL 102 06/09/2019    CO2 28 06/09/2019    GAP 11 06/09/2019    BUN 13 06/09/2019    CR 0.67 06/09/2019    GLU 76 06/09/2019    CA 9.6 06/09/2019    ALBUMIN 4.6 06/09/2019    TOTBILI 0.5 06/09/2019      Coagulation: No results found for: PT, PTT, INR      06/07/20 Cardiology pre-op eval.     06/21/20 Echo  The left ventricular size, wall thickness and systolic function are normal. The ejection fraction by Simpson's biplane method is 60%. Normal left ventricular diastolic function.      Cardiac Risk assessment, 06/21/20 Dr. Arna Medici:  Based upon the results of the patient's clinical profile and non-invasive cardiovascular testing, the risk for cardiovascular morbidity and mortality associated with non-cardiac surgery is INTERMEDIATE. This by no means precludes surgery but provides information for the surgeon, anesthesiologist and patient concerning the risks of surgery.  A decision to proceed with surgery should be made based upon the relative benefits and risks involved. Indeed, breast reduction and liposuction could  be a very helpful intervention for this patient's overall health maintenance and could prove very helpful for improvement in her exercise tolerance, functional capacity and cardiovascular stability. There are no absolute contra-indications to elective surgery, as deemed necessary. No additional cardiovascular testing is required at this time. SBG      Anesthesia Plan    ASA score: 3     Induction method: intravenous      Informed Consent  Use of blood products discussed with patient            LAB: CBC, BMP

## 2020-07-13 NOTE — Pre-Anesthesia Patient Instructions
GENERAL INFORMATION    Before you come to the hospital  Make arrangements for a responsible adult to drive you home and stay with you for 24 hours following surgery.  Bath/Shower Instructions  Take a bath or shower using the special soap given to you in PAC. Use half the bottle the night before, and the other half the morning of your procedure. Use clean towels with each bath or shower.  Put on clean clothes after bath or shower.  Avoid using lotion and oils.  If you are having surgery above the waist, wear a shirt that fastens up the front.  Sleep on clean sheets if bath or shower is done the night before procedure.  Leave money, credit cards, jewelry, and any other valuables at home. The [facility] is not responsible for the loss or breakage of personal items.  Remove nail polish, makeup and all jewelry (including piercings) before coming to the hospital.  The morning of your procedure:  brush your teeth and tongue  do not smoke  do not shave the area where you will have surgery    What to bring to the hospital  ID/ Insurance Card  Medical Device card  Official documents for legal guardianship   Copy of your Living Will, Advanced Directives, and/or Durable Power of Attorney   Small bag with a few personal belongings  CPAP/BiPAP machine (including all supplies)  Walker,cane, or motorized scooter  Cases for glasses/hearing aids/contact lens (bring solutions for contacts)  Dress in clean, loose, comfortable clothing     Eating or drinking before surgery  Do not eat or drink anything after 11:00 p.m. the day before your procedure (including gum, mints, candy, or chewing tobacco) OR follow the specific instructions you were given by your Surgeon.  You may have WATER ONLY up to 2 hours before arriving at the hospital.  Other instructions:      Other instructions  Notify your surgeon if:  there is a possibility that you are pregnant  you become ill with a cough, fever, sore throat, nausea, vomiting or flu-like symptoms  you have any open wounds/sores that are red, painful, draining, or are new since you last saw  the doctor  you need to cancel your procedure  You will receive a call with your surgery arrival time from between 2:30pm and 4:30pm the last business day before your procedure.  If you do not receive a call, please call [phone] before 4:30pm or [phone] after 4:30pm.    Notify us at [facility]: [phone]  if you need to cancel your procedure  if you are going to be late    Arrival at the hospital    [facility]  [address]  [place], [place] [address]    Park in the [employer], located directly across from the main entrance to the hospital.  Judee [place] parking is available  from 7 AM to 4 PM Monday through Friday.  Enter through the ground floor main hospital entrance and check in at the Information Desk in the lobby.  They will validate your parking ticket and direct you to the next location.  If you are a woman between the ages of 39 and 15, and have not had a hysterectomy, you will be asked for a urine sample prior to surgery.  Please do not urinate before arriving in the Surgery Waiting Room.  Once there, check in and let the attendant know if you need to  provide a sample.     Coronavirus (COVID19) Information  If you get sick with fever (100.10F/38C or higher), cough, or have trouble breathing:  Call your primary care physician for questions or health needs.  Tell your doctor about any recent travel and your symptoms.  Check your MyChart for your COVID swab results. (MyChart notifications are immediate and patients are often know their test result before their surgeon is notified).  Notify your surgeon if you are COVID+ positive.  If you are COVID+ positive DO NOT come to the hospital for your surgery until your surgeon has instructed you on what to do. Wait for instructions to find out if you should stay home or if you should still have surgery.  Avoid contact with others.    For up to date information on the Coronavirus, visit the CDC website at DiningCalendar.de.     For the safety of all patients, visitors and staff as we work to contain COVID-19, we must restrict patient visitors.    Current Visitor Policy (02/22/20):    Our current, and ongoing, visitor rules in surgery and procedural areas are:    1 visitor per patient will be allowed to accompany the patient and wait in the Waiting Room  No visitors will be allowed into the pre/post areas       Patients in inpatient and pediatric units, Emergency Department, ambulatory clinics and lab appointments may now have two visitors at the same time.  For inpatient stays, there is no limit on the amount of visitors per day but only two visitors may visit at a time.  The policy applies to [facility]s [place], [address], [place] and [place] campuses and clinics.      Exceptions include:  No visitors allowed for patients with active COVID-19 infections.  No visitors under the age of 74 for inpatients.  Parents or guardians may bring their children to their clinic appointments or bring siblings to their children's clinic appointment.  One visitor allowed during a patient?s cancer exam appointment; however, no visitors allowed during their cancer treatment/infusion appointment; check with staff for exceptions.  One visitor allowed in perioperative and procedural waiting rooms; no visitors allowed in pre/post areas unless a patient becomes an overnight boarder.  Two parents/guardians are allowed for surgical or procedural patients younger than 51 years old.  Adult inpatients in semiprivate rooms may have visitors, but visits should be coordinated so only two total visitors are in a room at a time due to space limitations.    Visitors must be free of fever and symptoms to be in our facilities. We ask visitors to follow these guidelines:  Wear a mask at all times, unless under the age of 2, have trouble breathing or are unconscious, incapacitated or otherwise unable to remove the cover without assistance.  Go directly to the nursing station in the unit you are visiting and do not linger in public areas.  Check in at the nursing station before going to the patient's room.  Maintain a physical distance of six feet from all others.  Follow elevator restrictions to four riding at a time - peak times are 6:30-7:30 a.m., noon and 6:30-7:30 p.m.  Be aware cafeteria peak times are 11 a.m. - 1 p.m.  Wash your hands frequently and cover your coughs and sneezes.     If you are having an outpatient procedure, you will need to arrange for a responsible ride/person to accompany you home due  to sedation or anesthesia with your procedure. A responsible person is a person who has the ability to identify a change in the patient's status and notify medical personnel.  This is typically a family member or friend.  Public transportation is permitted if you have a responsible person to accompany you.  An [employer], taxi or other public transportation driver is not considered a responsible person to accompany you home.     It is strongly recommended that all patients undergoing an operation or procedure have a COVID19 test performed 2-3 days prior to their procedure regardless of vaccination status.  Please contact your surgeon to schedule this testing or call the [facility] COVID hotline @ [phone].  A viral PCR swab test is the best testing method.  Rapid COVID testing is not recommended.  Failure to complete the COVID test could result in delay or cancellation of your procedure.

## 2020-07-17 ENCOUNTER — Encounter: Admit: 2020-07-17 | Discharge: 2020-07-17 | Payer: PRIVATE HEALTH INSURANCE

## 2020-07-17 MED ORDER — PANTOPRAZOLE 40 MG PO TBEC
ORAL_TABLET | Freq: Every day | 0 refills
Start: 2020-07-17 — End: ?

## 2020-07-17 MED ORDER — SERTRALINE 25 MG PO TAB
25 mg | ORAL_TABLET | Freq: Every day | ORAL | 1 refills | Status: AC
Start: 2020-07-17 — End: ?

## 2020-07-20 ENCOUNTER — Encounter: Admit: 2020-07-20 | Discharge: 2020-07-20 | Payer: PRIVATE HEALTH INSURANCE

## 2020-07-20 DIAGNOSIS — Z720 Tobacco use: Secondary | ICD-10-CM

## 2020-07-20 DIAGNOSIS — E7800 Pure hypercholesterolemia, unspecified: Secondary | ICD-10-CM

## 2020-07-20 DIAGNOSIS — T753XXA Motion sickness, initial encounter: Secondary | ICD-10-CM

## 2020-07-20 DIAGNOSIS — K859 Acute pancreatitis without necrosis or infection, unspecified: Secondary | ICD-10-CM

## 2020-07-20 DIAGNOSIS — R519 Generalized headaches: Secondary | ICD-10-CM

## 2020-07-20 DIAGNOSIS — G4733 Obstructive sleep apnea (adult) (pediatric): Secondary | ICD-10-CM

## 2020-07-20 DIAGNOSIS — J449 Chronic obstructive pulmonary disease, unspecified: Secondary | ICD-10-CM

## 2020-07-20 DIAGNOSIS — J302 Other seasonal allergic rhinitis: Secondary | ICD-10-CM

## 2020-07-20 DIAGNOSIS — G5601 Carpal tunnel syndrome, right upper limb: Secondary | ICD-10-CM

## 2020-07-20 DIAGNOSIS — R0600 Dyspnea, unspecified: Secondary | ICD-10-CM

## 2020-07-20 DIAGNOSIS — F99 Mental disorder, not otherwise specified: Secondary | ICD-10-CM

## 2020-07-20 DIAGNOSIS — I10 Essential (primary) hypertension: Secondary | ICD-10-CM

## 2020-07-20 DIAGNOSIS — R42 Dizziness and giddiness: Secondary | ICD-10-CM

## 2020-07-20 DIAGNOSIS — N3010 Interstitial cystitis (chronic) without hematuria: Secondary | ICD-10-CM

## 2020-07-20 DIAGNOSIS — M1990 Unspecified osteoarthritis, unspecified site: Secondary | ICD-10-CM

## 2020-07-20 DIAGNOSIS — M549 Dorsalgia, unspecified: Secondary | ICD-10-CM

## 2020-07-20 DIAGNOSIS — G43909 Migraine, unspecified, not intractable, without status migrainosus: Secondary | ICD-10-CM

## 2020-07-21 ENCOUNTER — Encounter: Admit: 2020-07-21 | Discharge: 2020-07-21 | Payer: PRIVATE HEALTH INSURANCE

## 2020-07-21 DIAGNOSIS — Z720 Tobacco use: Secondary | ICD-10-CM

## 2020-07-21 DIAGNOSIS — J302 Other seasonal allergic rhinitis: Secondary | ICD-10-CM

## 2020-07-21 DIAGNOSIS — R42 Dizziness and giddiness: Secondary | ICD-10-CM

## 2020-07-21 DIAGNOSIS — K859 Acute pancreatitis without necrosis or infection, unspecified: Secondary | ICD-10-CM

## 2020-07-21 DIAGNOSIS — F99 Mental disorder, not otherwise specified: Secondary | ICD-10-CM

## 2020-07-21 DIAGNOSIS — G43909 Migraine, unspecified, not intractable, without status migrainosus: Secondary | ICD-10-CM

## 2020-07-21 DIAGNOSIS — T753XXA Motion sickness, initial encounter: Secondary | ICD-10-CM

## 2020-07-21 DIAGNOSIS — I10 Essential (primary) hypertension: Secondary | ICD-10-CM

## 2020-07-21 DIAGNOSIS — E7800 Pure hypercholesterolemia, unspecified: Secondary | ICD-10-CM

## 2020-07-21 DIAGNOSIS — R0600 Dyspnea, unspecified: Secondary | ICD-10-CM

## 2020-07-21 DIAGNOSIS — G5601 Carpal tunnel syndrome, right upper limb: Secondary | ICD-10-CM

## 2020-07-21 DIAGNOSIS — J449 Chronic obstructive pulmonary disease, unspecified: Secondary | ICD-10-CM

## 2020-07-21 DIAGNOSIS — R519 Generalized headaches: Secondary | ICD-10-CM

## 2020-07-21 DIAGNOSIS — M549 Dorsalgia, unspecified: Secondary | ICD-10-CM

## 2020-07-21 DIAGNOSIS — G4733 Obstructive sleep apnea (adult) (pediatric): Secondary | ICD-10-CM

## 2020-07-21 DIAGNOSIS — N3010 Interstitial cystitis (chronic) without hematuria: Secondary | ICD-10-CM

## 2020-07-21 DIAGNOSIS — M1990 Unspecified osteoarthritis, unspecified site: Secondary | ICD-10-CM

## 2020-07-23 ENCOUNTER — Encounter: Admit: 2020-07-23 | Discharge: 2020-07-23 | Payer: PRIVATE HEALTH INSURANCE

## 2020-07-23 DIAGNOSIS — F99 Mental disorder, not otherwise specified: Secondary | ICD-10-CM

## 2020-07-23 DIAGNOSIS — R0600 Dyspnea, unspecified: Secondary | ICD-10-CM

## 2020-07-23 DIAGNOSIS — M1990 Unspecified osteoarthritis, unspecified site: Secondary | ICD-10-CM

## 2020-07-23 DIAGNOSIS — M549 Dorsalgia, unspecified: Secondary | ICD-10-CM

## 2020-07-23 DIAGNOSIS — G5601 Carpal tunnel syndrome, right upper limb: Secondary | ICD-10-CM

## 2020-07-23 DIAGNOSIS — T753XXA Motion sickness, initial encounter: Secondary | ICD-10-CM

## 2020-07-23 DIAGNOSIS — I10 Essential (primary) hypertension: Secondary | ICD-10-CM

## 2020-07-23 DIAGNOSIS — G43909 Migraine, unspecified, not intractable, without status migrainosus: Secondary | ICD-10-CM

## 2020-07-23 DIAGNOSIS — N3010 Interstitial cystitis (chronic) without hematuria: Secondary | ICD-10-CM

## 2020-07-23 DIAGNOSIS — R42 Dizziness and giddiness: Secondary | ICD-10-CM

## 2020-07-23 DIAGNOSIS — E7800 Pure hypercholesterolemia, unspecified: Secondary | ICD-10-CM

## 2020-07-23 DIAGNOSIS — J449 Chronic obstructive pulmonary disease, unspecified: Secondary | ICD-10-CM

## 2020-07-23 DIAGNOSIS — Z720 Tobacco use: Secondary | ICD-10-CM

## 2020-07-23 DIAGNOSIS — R519 Generalized headaches: Secondary | ICD-10-CM

## 2020-07-23 DIAGNOSIS — J302 Other seasonal allergic rhinitis: Secondary | ICD-10-CM

## 2020-07-23 DIAGNOSIS — G4733 Obstructive sleep apnea (adult) (pediatric): Secondary | ICD-10-CM

## 2020-07-23 DIAGNOSIS — K859 Acute pancreatitis without necrosis or infection, unspecified: Secondary | ICD-10-CM

## 2020-07-23 MED ORDER — LIDOCAINE (PF) 200 MG/10 ML (2 %) IJ SYRG
INTRAVENOUS | 0 refills | Status: DC
Start: 2020-07-23 — End: 2020-07-23
  Administered 2020-07-23: 15:00:00 80 mg via INTRAVENOUS

## 2020-07-23 MED ORDER — HYDROMORPHONE (PF) 2 MG/ML IJ SYRG
INTRAVENOUS | 0 refills | Status: DC
Start: 2020-07-23 — End: 2020-07-23
  Administered 2020-07-23 (×2): .5 mg via INTRAVENOUS

## 2020-07-23 MED ORDER — FENTANYL CITRATE (PF) 50 MCG/ML IJ SOLN
INTRAVENOUS | 0 refills | Status: DC
Start: 2020-07-23 — End: 2020-07-23
  Administered 2020-07-23: 15:00:00 100 ug via INTRAVENOUS

## 2020-07-23 MED ORDER — ACETAMINOPHEN 1,000 MG/100 ML (10 MG/ML) IV SOLN
INTRAVENOUS | 0 refills | Status: DC
Start: 2020-07-23 — End: 2020-07-23
  Administered 2020-07-23: 16:00:00 1000 mg via INTRAVENOUS

## 2020-07-23 MED ORDER — CEFAZOLIN 1 GRAM IJ SOLR
INTRAVENOUS | 0 refills | Status: DC
Start: 2020-07-23 — End: 2020-07-23
  Administered 2020-07-23: 15:00:00 2 g via INTRAVENOUS

## 2020-07-23 MED ORDER — ARTIFICIAL TEARS SINGLE DOSE DROPS GROUP
OPHTHALMIC | 0 refills | Status: DC
Start: 2020-07-23 — End: 2020-07-23
  Administered 2020-07-23: 15:00:00 2 [drp] via OPHTHALMIC

## 2020-07-23 MED ORDER — PHENYLEPHRINE HCL IN 0.9% NACL 1 MG/10 ML (100 MCG/ML) IV SYRG
INTRAVENOUS | 0 refills | Status: DC
Start: 2020-07-23 — End: 2020-07-23
  Administered 2020-07-23 (×2): 100 ug via INTRAVENOUS

## 2020-07-23 MED ORDER — ROCURONIUM 10 MG/ML IV SOLN
INTRAVENOUS | 0 refills | Status: DC
Start: 2020-07-23 — End: 2020-07-23
  Administered 2020-07-23: 16:00:00 5 mg via INTRAVENOUS
  Administered 2020-07-23: 15:00:00 50 mg via INTRAVENOUS
  Administered 2020-07-23: 16:00:00 10 mg via INTRAVENOUS

## 2020-07-23 MED ORDER — SUGAMMADEX 100 MG/ML IV SOLN
INTRAVENOUS | 0 refills | Status: DC
Start: 2020-07-23 — End: 2020-07-23
  Administered 2020-07-23: 17:00:00 200 mg via INTRAVENOUS

## 2020-07-23 MED ORDER — MIDAZOLAM 1 MG/ML IJ SOLN
INTRAVENOUS | 0 refills | Status: DC
Start: 2020-07-23 — End: 2020-07-23
  Administered 2020-07-23: 14:00:00 2 mg via INTRAVENOUS

## 2020-07-23 MED ORDER — PROPOFOL INJ 10 MG/ML IV VIAL
INTRAVENOUS | 0 refills | Status: DC
Start: 2020-07-23 — End: 2020-07-23
  Administered 2020-07-23: 15:00:00 200 mg via INTRAVENOUS

## 2020-07-23 MED ORDER — ONDANSETRON HCL (PF) 4 MG/2 ML IJ SOLN
INTRAVENOUS | 0 refills | Status: DC
Start: 2020-07-23 — End: 2020-07-23
  Administered 2020-07-23: 17:00:00 4 mg via INTRAVENOUS

## 2020-07-23 MED ORDER — GLYCOPYRROLATE 0.2 MG/ML IJ SOLN
INTRAVENOUS | 0 refills | Status: DC
Start: 2020-07-23 — End: 2020-07-23
  Administered 2020-07-23: 15:00:00 .2 mg via INTRAVENOUS

## 2020-07-23 MED ORDER — DIPHENHYDRAMINE HCL 50 MG/ML IJ SOLN
INTRAVENOUS | 0 refills | Status: DC
Start: 2020-07-23 — End: 2020-07-23
  Administered 2020-07-23: 16:00:00 25 mg via INTRAVENOUS

## 2020-07-23 MED ORDER — KETOROLAC 30 MG/ML (1 ML) IJ SOLN
INTRAVENOUS | 0 refills | Status: DC
Start: 2020-07-23 — End: 2020-07-23
  Administered 2020-07-23: 16:00:00 30 mg via INTRAVENOUS

## 2020-07-23 MED ADMIN — LACTATED RINGERS IV SOLP [4318]: 1000 mL | @ 15:00:00 | Stop: 2020-07-23 | NDC 00338011704

## 2020-07-23 MED ADMIN — LACTATED RINGERS IV SOLP [4318]: 1000 mL | INTRAVENOUS | @ 13:00:00 | Stop: 2020-07-25 | NDC 00338011704

## 2020-07-23 MED ADMIN — LACTATED RINGERS IV SOLP [4318]: 1000.000 mL | INTRAVENOUS | @ 16:00:00 | Stop: 2020-07-25 | NDC 00338011704

## 2020-07-23 MED ADMIN — BUPIVACAINE-EPINEPHRINE 0.25 %-1:200,000 IJ SOLN [14983]: 20 mL | INTRAMUSCULAR | @ 16:00:00 | Stop: 2020-07-23 | NDC 63323046101

## 2020-07-23 MED ADMIN — LACTATED RINGERS IV SOLP [4318]: 0 mL | INTRAVENOUS | @ 16:00:00 | Stop: 2020-07-25 | NDC 00338011704

## 2020-07-23 MED ADMIN — ONDANSETRON HCL (PF) 4 MG/2 ML IJ SOLN [136012]: 4 mg | INTRAVENOUS | @ 22:00:00 | NDC 36000001225

## 2020-07-23 MED ADMIN — HALOPERIDOL LACTATE 5 MG/ML IJ SOLN [3584]: 1 mg | INTRAVENOUS | @ 20:00:00 | Stop: 2020-07-23 | NDC 10147091101

## 2020-07-23 MED ADMIN — LIDOCAINE-EPINEPHRINE 1 %-1:100,000 IJ SOLN [15955]: 1000 mL | @ 15:00:00 | Stop: 2020-07-23 | NDC 63323048205

## 2020-07-23 MED ADMIN — TRANEXAMIC ACID 1,000 MG/10 ML (100 MG/ML) IV SOLN [309767]: 10 mL | TOPICAL | @ 16:00:00 | Stop: 2020-07-23 | NDC 67457019710

## 2020-07-23 MED ADMIN — OXYCODONE 5 MG PO TAB [10814]: 5 mg | ORAL | @ 22:00:00 | NDC 00904696661

## 2020-07-24 MED ADMIN — OXYCODONE 5 MG PO TAB [10814]: 5 mg | ORAL | @ 09:00:00 | Stop: 2020-07-24 | NDC 00904696661

## 2020-07-24 MED ADMIN — OXYCODONE 5 MG PO TAB [10814]: 5 mg | ORAL | @ 14:00:00 | Stop: 2020-07-24 | NDC 00904696661

## 2020-07-24 MED ADMIN — DIVALPROEX 250 MG PO TBEC [77658]: 250 mg | ORAL | @ 03:00:00 | NDC 68084077611

## 2020-07-24 MED ADMIN — ACETAMINOPHEN 325 MG PO TAB [101]: 650 mg | ORAL | @ 06:00:00 | NDC 00904677361

## 2020-07-24 MED ADMIN — ACETAMINOPHEN 325 MG PO TAB [101]: 650 mg | ORAL | @ 14:00:00 | Stop: 2020-07-24 | NDC 00904677361

## 2020-07-24 MED ADMIN — SERTRALINE 50 MG PO TAB [82509]: 25 mg | ORAL | @ 14:00:00 | Stop: 2020-07-24 | NDC 60687024211

## 2020-07-24 MED ADMIN — ENOXAPARIN 40 MG/0.4 ML SC SYRG [85052]: 40 mg | SUBCUTANEOUS | @ 14:00:00 | Stop: 2020-07-24 | NDC 00781324602

## 2020-07-24 MED ADMIN — OXYCODONE 5 MG PO TAB [10814]: 5 mg | ORAL | @ 03:00:00 | NDC 00904696661

## 2020-07-24 MED ADMIN — SERTRALINE 50 MG PO TAB [82509]: 50 mg | ORAL | @ 14:00:00 | Stop: 2020-07-24 | NDC 60687024211

## 2020-07-24 MED ADMIN — DIVALPROEX 250 MG PO TBEC [77658]: 250 mg | ORAL | @ 14:00:00 | Stop: 2020-07-24 | NDC 68084077611

## 2020-07-24 MED ADMIN — SERTRALINE 50 MG PO TAB [82509]: 50 mg | ORAL | @ 03:00:00 | NDC 60687024211

## 2020-07-24 MED ADMIN — SERTRALINE 50 MG PO TAB [82509]: 25 mg | ORAL | @ 03:00:00 | NDC 60687024211

## 2020-07-24 MED ADMIN — PANTOPRAZOLE 40 MG PO TBEC [80436]: 40 mg | ORAL | @ 14:00:00 | Stop: 2020-07-24 | NDC 00904647461

## 2020-07-28 ENCOUNTER — Encounter: Admit: 2020-07-28 | Discharge: 2020-07-28 | Payer: PRIVATE HEALTH INSURANCE

## 2020-07-28 NOTE — Telephone Encounter
Telephone Encounter Note    Patient contacted on call resident with concerns for new rash. Patient reports that yesterday she started having some itching below her breasts. She thought it was irritation from her abdominal binder so she washed it. This morning she noted a faint rash below and above her breasts. She reports continued itching. Denies erythema, fevers, chills, shortness of breath, difficulty breathing, or tongue swelling. Photos of areas of concern uploaded to MyChart and reviewed with Dr. Keenan Bachelor. Recommend patient start taking benadryl prn for itching or applying topical hydrocortisone to rash and to call clinic on Monday. Discussed calling back or proceeding to ED if worsening symptoms, difficulty breathing or development of fevers. Patient voiced understanding.     Discussed with Dr. Keenan Bachelor, who directed plan of care     Arliss Journey, MD

## 2020-07-30 ENCOUNTER — Encounter: Admit: 2020-07-30 | Discharge: 2020-07-30 | Payer: PRIVATE HEALTH INSURANCE

## 2020-07-30 NOTE — Telephone Encounter
Pt messaged about a rash on 11/27.  She spoke with the resident on call.  I followed up with a call to her this morning.  She states some of the rash on the sides is improved. There is more spreading rash on upper breasts, below the breasts.  She has been taking Benadryl orally around the clock and used topical cortisone, bumps are spreading and she scratched open a few in the night. In addition, she reports a red, warm lump on one side towards under arm.  I asked her to send a photo of that area as soon as she can. She accepted an appointment this Wednesday to see Dr. Keenan Bachelor. She will continue the cortisone topically and benadryl until seen.

## 2020-08-01 ENCOUNTER — Encounter: Admit: 2020-08-01 | Discharge: 2020-08-01 | Payer: PRIVATE HEALTH INSURANCE

## 2020-08-01 ENCOUNTER — Ambulatory Visit: Admit: 2020-08-01 | Discharge: 2020-08-02 | Payer: Commercial Managed Care - HMO

## 2020-08-01 DIAGNOSIS — J302 Other seasonal allergic rhinitis: Secondary | ICD-10-CM

## 2020-08-01 DIAGNOSIS — M549 Dorsalgia, unspecified: Secondary | ICD-10-CM

## 2020-08-01 DIAGNOSIS — G4733 Obstructive sleep apnea (adult) (pediatric): Secondary | ICD-10-CM

## 2020-08-01 DIAGNOSIS — R519 Generalized headaches: Secondary | ICD-10-CM

## 2020-08-01 DIAGNOSIS — R42 Dizziness and giddiness: Secondary | ICD-10-CM

## 2020-08-01 DIAGNOSIS — Z411 Encounter for cosmetic surgery: Secondary | ICD-10-CM

## 2020-08-01 DIAGNOSIS — G5601 Carpal tunnel syndrome, right upper limb: Secondary | ICD-10-CM

## 2020-08-01 DIAGNOSIS — J449 Chronic obstructive pulmonary disease, unspecified: Secondary | ICD-10-CM

## 2020-08-01 DIAGNOSIS — R0600 Dyspnea, unspecified: Secondary | ICD-10-CM

## 2020-08-01 DIAGNOSIS — E7800 Pure hypercholesterolemia, unspecified: Secondary | ICD-10-CM

## 2020-08-01 DIAGNOSIS — M1990 Unspecified osteoarthritis, unspecified site: Secondary | ICD-10-CM

## 2020-08-01 DIAGNOSIS — F99 Mental disorder, not otherwise specified: Secondary | ICD-10-CM

## 2020-08-01 DIAGNOSIS — E65 Localized adiposity: Secondary | ICD-10-CM

## 2020-08-01 DIAGNOSIS — Z720 Tobacco use: Secondary | ICD-10-CM

## 2020-08-01 DIAGNOSIS — G43909 Migraine, unspecified, not intractable, without status migrainosus: Secondary | ICD-10-CM

## 2020-08-01 DIAGNOSIS — T753XXA Motion sickness, initial encounter: Secondary | ICD-10-CM

## 2020-08-01 DIAGNOSIS — N3010 Interstitial cystitis (chronic) without hematuria: Secondary | ICD-10-CM

## 2020-08-01 DIAGNOSIS — E669 Obesity, unspecified: Secondary | ICD-10-CM

## 2020-08-01 DIAGNOSIS — I10 Essential (primary) hypertension: Secondary | ICD-10-CM

## 2020-08-01 DIAGNOSIS — K859 Acute pancreatitis without necrosis or infection, unspecified: Secondary | ICD-10-CM

## 2020-08-01 DIAGNOSIS — N62 Hypertrophy of breast: Secondary | ICD-10-CM

## 2020-08-01 MED ORDER — METHYLPREDNISOLONE 4 MG PO DSPK
ORAL_TABLET | 0 refills | Status: AC
Start: 2020-08-01 — End: ?

## 2020-08-01 NOTE — Progress Notes
Subjective:       History of Present Illness  Christina Garrison is a 49 y.o. female.  S/p breast reduction and liposuction to trunk and lateral chest wall.   Has a rash on her trunk.      Review of Systems   Constitutional: Negative.    HENT: Negative.    Eyes: Negative.    Respiratory: Negative.    Cardiovascular: Negative.    Gastrointestinal: Negative.    Endocrine: Negative.    Genitourinary: Negative.    Musculoskeletal: Negative.    Skin: Positive for rash.   Allergic/Immunologic: Negative.    Neurological: Negative.    Hematological: Negative.    Psychiatric/Behavioral: Negative.          Objective:         ? biotin 5,000 mcg TbDi Dissolve 2 tablets by mouth twice daily.   ? BYSTOLIC 5 mg tablet TAKE 1 TABLET BY MOUTH EVERY DAY   ? cholecalciferol (VITAMIN D-3) 1,000 units tablet Take 1,000 Units by mouth twice weekly.   ? clonazePAM (KLONOPIN) 0.5 mg tablet Take 1.5 tablets by mouth twice daily. (Patient taking differently: Take 0.75 mg by mouth daily.)   ? clonazePAM (KLONOPIN) 1 mg tablet Take one tablet by mouth as Needed (For driving). (Patient taking differently: Take 1 mg by mouth daily with dinner.)   ? cyanocobalamin (vitamin B-12) 2,500 mcg tab Take 1 tablet by mouth daily with breakfast.   ? diphenhydrAMINE (BENADRYL ALLERGY) 12.5 mg/5 mL oral solution Take 12.5 mg by mouth every 6 hours as needed.   ? divalproex (DEPAKOTE EC) 250 mg DR tablet TAKE 1 TABLET BY MOUTH TWICE A DAY WITH FOOD FOR BIPOLAR DEPRESSION   ? famotidine (PEPCID) 20 mg tablet Take 20 mg by mouth daily as needed.   ? melatonin 3 mg tab Take one tablet by mouth at bedtime daily.   ? multivit, Ca, min-FA-soy isofl 400-60 mcg-mg tab Take 1 tablet by mouth twice daily.   ? nystatin/triamcinolone 100,000 unit/g / 0.1 % topical cream Apply  topically to affected area twice daily. Apply sparingly to area twice a day for 5 days followed by once a day for 5 days. (Patient taking differently: Apply  topically to affected area twice daily as needed. Apply sparingly to area twice a day for 5 days followed by once a day for 5 days.)   ? ondansetron (ZOFRAN ODT) 4 mg rapid dissolve tablet Dissolve one tablet by mouth every 8 hours as needed. Place on tongue to disolve.   ? oxyCODONE (ROXICODONE) 5 mg tablet Take one tablet by mouth every 4 hours as needed for Pain   ? pantoprazole DR (PROTONIX) 40 mg tablet TAKE 1 TABLET BY MOUTH EVERY DAY (Patient taking differently: Take 40 mg by mouth as Needed.)   ? Potassium 99 mg tab Take 1 tablet by mouth three times weekly.   ? prochlorperazine maleate (COMPAZINE) 10 mg tablet Take one tablet by mouth every 6 hours as needed for Nausea or Vomiting.   ? rosuvastatin (CRESTOR) 20 mg tablet TAKE 1 TABLET BY MOUTH EVERY DAY (Patient taking differently: Take 20 mg by mouth at bedtime daily.)   ? scopolamine (TRANSDERM-SCOP) 1.5 mg 3 day patch Apply one patch to top of skin as directed every 72 hours. (Patient taking differently: Apply 1 patch to top of skin as directed as Needed.)   ? sertraline (ZOLOFT) 100 mg tablet TAKE 1 TABLET BY MOUTH EVERY DAY FOR DEPRESSION (Patient taking differently: Take  50 mg by mouth daily.)   ? sertraline (ZOLOFT) 25 mg tablet Take one tablet by mouth daily. For a total of 75mg  daily.  Indications: anxiousness associated with depression   ? verapamil (CALAN) 40 mg tablet TAKE 1/2 TABLET BY MOUTH IN THE MORNING AND TAKE 1 TABLET IN THE EVENING   ? vitamins, multi w/minerals 9 mg iron-400 mcg tab Take 1 tablet by mouth daily with breakfast.     Vitals:    08/01/20 0917   BP: 123/76   BP Source: Arm, Right Upper   Patient Position: Sitting   Pulse: 73   Temp: 36.2 ?C (97.2 ?F)   TempSrc: Skin   Weight: 103.9 kg (229 lb)   Height: 165.1 cm (65)   PainSc: Five     Body mass index is 38.11 kg/m?Marland Kitchen     Physical Exam  Vitals reviewed.   Constitutional:       General: She is not in acute distress.  Cardiovascular:      Rate and Rhythm: Normal rate.   Pulmonary:      Effort: Pulmonary effort is normal.   Chest:      Comments: Incisions intact.  Reports she had sensation to the nipples immediately post op.   Abdominal:      Comments: Good contour to trunk.  Areas very swollen.    Skin:     Comments: Macular papular rash on trunk.    Neurological:      Mental Status: She is alert.   Psychiatric:         Mood and Affect: Mood normal.         Thought Content: Thought content normal.              Assessment and Plan:        Problem List Items Addressed This Visit     Obesity (BMI 30-39.9)    Encounter for cosmetic surgery    Localized adiposity    Macromastia - Primary               continue compression.   Removed the tape today.   Ordered medrol dose pack.   Has taken prednisone before.     Showed her the photos from surgery, liposuction.  Reviewed pathology:   Final Diagnosis:     A. Breast tissue, right breast tissue, reduction mammoplasty:   No diagnostic abnormalities. ?     B. Breast tissue, left breast tissue, reduction mammoplasty:   No diagnostic abnormalities.     Return in 2 weeks.

## 2020-08-02 ENCOUNTER — Encounter: Admit: 2020-08-02 | Discharge: 2020-08-02 | Payer: PRIVATE HEALTH INSURANCE

## 2020-08-14 ENCOUNTER — Encounter: Admit: 2020-08-14 | Discharge: 2020-08-14 | Payer: PRIVATE HEALTH INSURANCE

## 2020-08-15 ENCOUNTER — Encounter: Admit: 2020-08-15 | Discharge: 2020-08-15 | Payer: PRIVATE HEALTH INSURANCE

## 2020-08-15 ENCOUNTER — Ambulatory Visit: Admit: 2020-08-15 | Discharge: 2020-08-16 | Payer: Commercial Managed Care - HMO

## 2020-08-15 DIAGNOSIS — J302 Other seasonal allergic rhinitis: Secondary | ICD-10-CM

## 2020-08-15 DIAGNOSIS — R0600 Dyspnea, unspecified: Secondary | ICD-10-CM

## 2020-08-15 DIAGNOSIS — N62 Hypertrophy of breast: Secondary | ICD-10-CM

## 2020-08-15 DIAGNOSIS — Z720 Tobacco use: Secondary | ICD-10-CM

## 2020-08-15 DIAGNOSIS — G43909 Migraine, unspecified, not intractable, without status migrainosus: Secondary | ICD-10-CM

## 2020-08-15 DIAGNOSIS — N3010 Interstitial cystitis (chronic) without hematuria: Secondary | ICD-10-CM

## 2020-08-15 DIAGNOSIS — K859 Acute pancreatitis without necrosis or infection, unspecified: Secondary | ICD-10-CM

## 2020-08-15 DIAGNOSIS — F99 Mental disorder, not otherwise specified: Secondary | ICD-10-CM

## 2020-08-15 DIAGNOSIS — E7800 Pure hypercholesterolemia, unspecified: Secondary | ICD-10-CM

## 2020-08-15 DIAGNOSIS — J449 Chronic obstructive pulmonary disease, unspecified: Secondary | ICD-10-CM

## 2020-08-15 DIAGNOSIS — I10 Essential (primary) hypertension: Secondary | ICD-10-CM

## 2020-08-15 DIAGNOSIS — M1990 Unspecified osteoarthritis, unspecified site: Secondary | ICD-10-CM

## 2020-08-15 DIAGNOSIS — T753XXA Motion sickness, initial encounter: Secondary | ICD-10-CM

## 2020-08-15 DIAGNOSIS — R42 Dizziness and giddiness: Secondary | ICD-10-CM

## 2020-08-15 DIAGNOSIS — G5601 Carpal tunnel syndrome, right upper limb: Secondary | ICD-10-CM

## 2020-08-15 DIAGNOSIS — G4733 Obstructive sleep apnea (adult) (pediatric): Secondary | ICD-10-CM

## 2020-08-15 DIAGNOSIS — R519 Generalized headaches: Secondary | ICD-10-CM

## 2020-08-15 DIAGNOSIS — M549 Dorsalgia, unspecified: Secondary | ICD-10-CM

## 2020-08-15 MED ORDER — MUPIROCIN 2 % TP OINT
Freq: Two times a day (BID) | TOPICAL | 1 refills | 11.00000 days | Status: AC
Start: 2020-08-15 — End: ?

## 2020-08-15 NOTE — Progress Notes
Subjective:       History of Present Illness  Christina Garrison is a 49 y.o. female s/p bilateral breast reduction and liposuction to lateral chest, abdomen and flanks on 11.22.21.    Here for 3 week postop. Was seen on 12/1 d/t rash on trunk, was started on Medrol dose pack, This has resolved. States she now has infection to left breast, she states breast incision is weeping and burns for the past 3 days. She has used peroxide and bacitracin to the area. Denies fevers.       Review of Systems   Constitutional: Negative.    HENT: Negative.    Eyes: Negative.    Respiratory: Negative.    Cardiovascular: Negative.    Gastrointestinal: Negative.    Endocrine: Negative.    Genitourinary: Negative.    Musculoskeletal: Negative.    Skin: Negative.    Allergic/Immunologic: Negative.    Neurological: Negative.    Hematological: Negative.    Psychiatric/Behavioral: Negative.          Objective:         ? biotin 5,000 mcg TbDi Dissolve 2 tablets by mouth twice daily.   ? BYSTOLIC 5 mg tablet TAKE 1 TABLET BY MOUTH EVERY DAY   ? cholecalciferol (VITAMIN D-3) 1,000 units tablet Take 1,000 Units by mouth twice weekly.   ? clonazePAM (KLONOPIN) 0.5 mg tablet Take 1.5 tablets by mouth twice daily. (Patient taking differently: Take 0.75 mg by mouth daily.)   ? clonazePAM (KLONOPIN) 1 mg tablet Take one tablet by mouth as Needed (For driving). (Patient taking differently: Take 1 mg by mouth daily with dinner.)   ? cyanocobalamin (vitamin B-12) 2,500 mcg tab Take 1 tablet by mouth daily with breakfast.   ? diphenhydrAMINE (BENADRYL ALLERGY) 12.5 mg/5 mL oral solution Take 12.5 mg by mouth every 6 hours as needed.   ? divalproex (DEPAKOTE EC) 250 mg DR tablet TAKE 1 TABLET BY MOUTH TWICE A DAY WITH FOOD FOR BIPOLAR DEPRESSION   ? famotidine (PEPCID) 20 mg tablet Take 20 mg by mouth daily as needed.   ? multivit, Ca, min-FA-soy isofl 400-60 mcg-mg tab Take 1 tablet by mouth twice daily.   ? nystatin/triamcinolone 100,000 unit/g / 0.1 % topical cream Apply  topically to affected area twice daily. Apply sparingly to area twice a day for 5 days followed by once a day for 5 days. (Patient taking differently: Apply  topically to affected area twice daily as needed. Apply sparingly to area twice a day for 5 days followed by once a day for 5 days.)   ? ondansetron (ZOFRAN ODT) 4 mg rapid dissolve tablet Dissolve one tablet by mouth every 8 hours as needed. Place on tongue to disolve.   ? pantoprazole DR (PROTONIX) 40 mg tablet TAKE 1 TABLET BY MOUTH EVERY DAY (Patient taking differently: Take 40 mg by mouth as Needed.)   ? Potassium 99 mg tab Take 1 tablet by mouth three times weekly.   ? prochlorperazine maleate (COMPAZINE) 10 mg tablet Take one tablet by mouth every 6 hours as needed for Nausea or Vomiting.   ? rosuvastatin (CRESTOR) 20 mg tablet TAKE 1 TABLET BY MOUTH EVERY DAY (Patient taking differently: Take 20 mg by mouth at bedtime daily.)   ? scopolamine (TRANSDERM-SCOP) 1.5 mg 3 day patch Apply one patch to top of skin as directed every 72 hours. (Patient taking differently: Apply 1 patch to top of skin as directed as Needed.)   ? sertraline (ZOLOFT)  100 mg tablet TAKE 1 TABLET BY MOUTH EVERY DAY FOR DEPRESSION (Patient taking differently: Take 50 mg by mouth daily.)   ? sertraline (ZOLOFT) 25 mg tablet Take one tablet by mouth daily. For a total of 75mg  daily.  Indications: anxiousness associated with depression   ? verapamil (CALAN) 40 mg tablet TAKE 1/2 TABLET BY MOUTH IN THE MORNING AND TAKE 1 TABLET IN THE EVENING   ? vitamins, multi w/minerals 9 mg iron-400 mcg tab Take 1 tablet by mouth daily with breakfast.     Vitals:    08/15/20 0855   BP: 117/80   Pulse: 67   Temp: 36.3 ?C (97.3 ?F)   TempSrc: Skin   Weight: 100.7 kg (222 lb)   Height: 165.1 cm (65)   PainSc: Eight     Body mass index is 36.94 kg/m?Marland Kitchen     Physical Exam  Vitals reviewed.   Pulmonary:      Effort: Pulmonary effort is normal.   Chest:      Comments: Bilateral breast reduction with wise pattern incisions. Right breast incisions clean, dry and intact. No s/s infection or fluid collections. Right NAC viable, no sensate. Eschars along left IMF incision and superficial wound noted along medial incision, fibrinous exudate noted. No erythema to breasts. No s/s fluid collections. Left NAC viable w/sensate  Abdominal:      Comments: Liposuction sites dry and intact. No s/s infection or fluid collections. Improved contour   Skin:     General: Skin is warm and dry.   Neurological:      Mental Status: She is alert and oriented to person, place, and time.   Psychiatric:         Mood and Affect: Mood normal.              Assessment and Plan:  11.22.21 s/p bilateral breast reduction and liposuction to lateral chest, abdomen and flanks. On physical exam to left breast, I noted eschars along IMF incision and superficial wound noted along medial IMF incision. Patient states she was told to wear tight compression to her chest. I believe the wound breakdown is a combination of the barbed suture used in the operation along with compression from her bra along her IMF incision, no s/s of systemic infection      - Order placed for Mupiricin apply to left breast wound twice daily  - Vaseline or aquaphor to closed incisions  - Photos today  - Continue no vigorous acitivity or lifting greater than 10 pounds for 1 week  - Patient given larger pink bra and discussed we want her to wear a supportive bra but nothing for compression  - Follow up 1 week

## 2020-08-22 ENCOUNTER — Encounter: Admit: 2020-08-22 | Discharge: 2020-08-22 | Payer: PRIVATE HEALTH INSURANCE

## 2020-08-22 ENCOUNTER — Ambulatory Visit: Admit: 2020-08-22 | Discharge: 2020-08-23 | Payer: Commercial Managed Care - HMO

## 2020-08-22 DIAGNOSIS — G4733 Obstructive sleep apnea (adult) (pediatric): Secondary | ICD-10-CM

## 2020-08-22 DIAGNOSIS — G43909 Migraine, unspecified, not intractable, without status migrainosus: Secondary | ICD-10-CM

## 2020-08-22 DIAGNOSIS — I10 Essential (primary) hypertension: Secondary | ICD-10-CM

## 2020-08-22 DIAGNOSIS — R42 Dizziness and giddiness: Secondary | ICD-10-CM

## 2020-08-22 DIAGNOSIS — N62 Hypertrophy of breast: Secondary | ICD-10-CM

## 2020-08-22 DIAGNOSIS — M549 Dorsalgia, unspecified: Secondary | ICD-10-CM

## 2020-08-22 DIAGNOSIS — J449 Chronic obstructive pulmonary disease, unspecified: Secondary | ICD-10-CM

## 2020-08-22 DIAGNOSIS — R519 Generalized headaches: Secondary | ICD-10-CM

## 2020-08-22 DIAGNOSIS — K859 Acute pancreatitis without necrosis or infection, unspecified: Secondary | ICD-10-CM

## 2020-08-22 DIAGNOSIS — M1990 Unspecified osteoarthritis, unspecified site: Secondary | ICD-10-CM

## 2020-08-22 DIAGNOSIS — F99 Mental disorder, not otherwise specified: Secondary | ICD-10-CM

## 2020-08-22 DIAGNOSIS — E7800 Pure hypercholesterolemia, unspecified: Secondary | ICD-10-CM

## 2020-08-22 DIAGNOSIS — T753XXA Motion sickness, initial encounter: Secondary | ICD-10-CM

## 2020-08-22 DIAGNOSIS — G5601 Carpal tunnel syndrome, right upper limb: Secondary | ICD-10-CM

## 2020-08-22 DIAGNOSIS — J302 Other seasonal allergic rhinitis: Secondary | ICD-10-CM

## 2020-08-22 DIAGNOSIS — N3010 Interstitial cystitis (chronic) without hematuria: Secondary | ICD-10-CM

## 2020-08-22 DIAGNOSIS — R0600 Dyspnea, unspecified: Secondary | ICD-10-CM

## 2020-08-22 DIAGNOSIS — Z720 Tobacco use: Secondary | ICD-10-CM

## 2020-08-22 MED ORDER — ROSUVASTATIN 20 MG PO TAB
ORAL_TABLET | Freq: Every day | ORAL | 3 refills | 90.00000 days | Status: AC
Start: 2020-08-22 — End: ?

## 2020-08-22 MED ORDER — NYSTATIN 100,000 UNIT/GRAM TP POWD
1 g | Freq: Four times a day (QID) | TOPICAL | 1 refills | 30.00000 days | Status: AC
Start: 2020-08-22 — End: ?

## 2020-08-22 NOTE — Progress Notes
Subjective:       History of Present Illness  Christina Garrison is a 49 y.o. female.       Review of Systems   Constitutional: Negative.    HENT: Negative.    Eyes: Negative.    Respiratory: Negative.    Cardiovascular: Negative.    Gastrointestinal: Negative.    Endocrine: Negative.    Genitourinary: Negative.    Musculoskeletal: Negative.    Skin: Negative.    Allergic/Immunologic: Negative.    Neurological: Negative.    Hematological: Negative.    Psychiatric/Behavioral: Negative.          Objective:         ? biotin 5,000 mcg TbDi Dissolve 2 tablets by mouth twice daily.   ? BYSTOLIC 5 mg tablet TAKE 1 TABLET BY MOUTH EVERY DAY   ? cholecalciferol (VITAMIN D-3) 1,000 units tablet Take 1,000 Units by mouth twice weekly.   ? clonazePAM (KLONOPIN) 0.5 mg tablet Take 1.5 tablets by mouth twice daily. (Patient taking differently: Take 0.75 mg by mouth daily.)   ? clonazePAM (KLONOPIN) 1 mg tablet Take one tablet by mouth as Needed (For driving). (Patient taking differently: Take 1 mg by mouth daily with dinner.)   ? cyanocobalamin (vitamin B-12) 2,500 mcg tab Take 1 tablet by mouth daily with breakfast.   ? diphenhydrAMINE (BENADRYL ALLERGY) 12.5 mg/5 mL oral solution Take 12.5 mg by mouth every 6 hours as needed.   ? divalproex (DEPAKOTE EC) 250 mg DR tablet TAKE 1 TABLET BY MOUTH TWICE A DAY WITH FOOD FOR BIPOLAR DEPRESSION   ? famotidine (PEPCID) 20 mg tablet Take 20 mg by mouth daily as needed.   ? multivit, Ca, min-FA-soy isofl 400-60 mcg-mg tab Take 1 tablet by mouth twice daily.   ? mupirocin (CENTANY) 2 % topical ointment Apply  topically to affected area twice daily.   ? nystatin/triamcinolone 100,000 unit/g / 0.1 % topical cream Apply  topically to affected area twice daily. Apply sparingly to area twice a day for 5 days followed by once a day for 5 days. (Patient taking differently: Apply  topically to affected area twice daily as needed. Apply sparingly to area twice a day for 5 days followed by once a day for 5 days.)   ? ondansetron (ZOFRAN ODT) 4 mg rapid dissolve tablet Dissolve one tablet by mouth every 8 hours as needed. Place on tongue to disolve.   ? pantoprazole DR (PROTONIX) 40 mg tablet TAKE 1 TABLET BY MOUTH EVERY DAY (Patient taking differently: Take 40 mg by mouth as Needed.)   ? Potassium 99 mg tab Take 1 tablet by mouth three times weekly.   ? prochlorperazine maleate (COMPAZINE) 10 mg tablet Take one tablet by mouth every 6 hours as needed for Nausea or Vomiting.   ? rosuvastatin (CRESTOR) 20 mg tablet TAKE 1 TABLET BY MOUTH EVERY DAY   ? scopolamine (TRANSDERM-SCOP) 1.5 mg 3 day patch Apply one patch to top of skin as directed every 72 hours. (Patient taking differently: Apply 1 patch to top of skin as directed as Needed.)   ? sertraline (ZOLOFT) 100 mg tablet TAKE 1 TABLET BY MOUTH EVERY DAY FOR DEPRESSION (Patient taking differently: Take 50 mg by mouth daily.)   ? sertraline (ZOLOFT) 25 mg tablet Take one tablet by mouth daily. For a total of 75mg  daily.  Indications: anxiousness associated with depression   ? verapamil (CALAN) 40 mg tablet TAKE 1/2 TABLET BY MOUTH IN THE MORNING AND TAKE  1 TABLET IN THE EVENING   ? vitamins, multi w/minerals 9 mg iron-400 mcg tab Take 1 tablet by mouth daily with breakfast.     Vitals:    08/22/20 0852   BP: 114/77   Pulse: 58   Temp: 36.3 ?C (97.3 ?F)   TempSrc: Skin   Weight: 100.7 kg (222 lb)   Height: 165.1 cm (65)   PainSc: Five     Body mass index is 36.94 kg/m?Marland Kitchen     Physical Exam         Assessment and Plan:

## 2020-08-22 NOTE — Progress Notes
Subjective:       History of Present Illness  Christina Garrison is a 49 y.o. female s/p bilateral breast reduction and liposuction to lateral chest, abdomen and flanks on 11.22.21.    Here for 4 week postop  She was placed on Mupiricin last week to open areas along left IMF. She denies any changes             Review of Systems   Constitutional: Negative.    HENT: Negative.    Eyes: Negative.    Respiratory: Negative.    Cardiovascular: Negative.    Gastrointestinal: Negative.    Endocrine: Negative.    Genitourinary: Negative.    Musculoskeletal: Negative.    Skin: Negative.    Allergic/Immunologic: Negative.    Neurological: Negative.    Hematological: Negative.    Psychiatric/Behavioral: Negative.    All other systems reviewed and are negative.        Objective:         ? biotin 5,000 mcg TbDi Dissolve 2 tablets by mouth twice daily.   ? BYSTOLIC 5 mg tablet TAKE 1 TABLET BY MOUTH EVERY DAY   ? cholecalciferol (VITAMIN D-3) 1,000 units tablet Take 1,000 Units by mouth twice weekly.   ? clonazePAM (KLONOPIN) 0.5 mg tablet Take 1.5 tablets by mouth twice daily. (Patient taking differently: Take 0.75 mg by mouth daily.)   ? clonazePAM (KLONOPIN) 1 mg tablet Take one tablet by mouth as Needed (For driving). (Patient taking differently: Take 1 mg by mouth daily with dinner.)   ? cyanocobalamin (vitamin B-12) 2,500 mcg tab Take 1 tablet by mouth daily with breakfast.   ? diphenhydrAMINE (BENADRYL ALLERGY) 12.5 mg/5 mL oral solution Take 12.5 mg by mouth every 6 hours as needed.   ? divalproex (DEPAKOTE EC) 250 mg DR tablet TAKE 1 TABLET BY MOUTH TWICE A DAY WITH FOOD FOR BIPOLAR DEPRESSION   ? famotidine (PEPCID) 20 mg tablet Take 20 mg by mouth daily as needed.   ? multivit, Ca, min-FA-soy isofl 400-60 mcg-mg tab Take 1 tablet by mouth twice daily.   ? mupirocin (CENTANY) 2 % topical ointment Apply  topically to affected area twice daily.   ? nystatin/triamcinolone 100,000 unit/g / 0.1 % topical cream Apply  topically to affected area twice daily. Apply sparingly to area twice a day for 5 days followed by once a day for 5 days. (Patient taking differently: Apply  topically to affected area twice daily as needed. Apply sparingly to area twice a day for 5 days followed by once a day for 5 days.)   ? ondansetron (ZOFRAN ODT) 4 mg rapid dissolve tablet Dissolve one tablet by mouth every 8 hours as needed. Place on tongue to disolve.   ? pantoprazole DR (PROTONIX) 40 mg tablet TAKE 1 TABLET BY MOUTH EVERY DAY (Patient taking differently: Take 40 mg by mouth as Needed.)   ? Potassium 99 mg tab Take 1 tablet by mouth three times weekly.   ? prochlorperazine maleate (COMPAZINE) 10 mg tablet Take one tablet by mouth every 6 hours as needed for Nausea or Vomiting.   ? rosuvastatin (CRESTOR) 20 mg tablet TAKE 1 TABLET BY MOUTH EVERY DAY   ? scopolamine (TRANSDERM-SCOP) 1.5 mg 3 day patch Apply one patch to top of skin as directed every 72 hours. (Patient taking differently: Apply 1 patch to top of skin as directed as Needed.)   ? sertraline (ZOLOFT) 100 mg tablet TAKE 1 TABLET BY MOUTH EVERY DAY FOR  DEPRESSION (Patient taking differently: Take 50 mg by mouth daily.)   ? sertraline (ZOLOFT) 25 mg tablet Take one tablet by mouth daily. For a total of 75mg  daily.  Indications: anxiousness associated with depression   ? verapamil (CALAN) 40 mg tablet TAKE 1/2 TABLET BY MOUTH IN THE MORNING AND TAKE 1 TABLET IN THE EVENING   ? vitamins, multi w/minerals 9 mg iron-400 mcg tab Take 1 tablet by mouth daily with breakfast.     Vitals:    08/22/20 0852   BP: 114/77   Pulse: 58   Temp: 36.3 ?C (97.3 ?F)   TempSrc: Skin   Weight: 100.7 kg (222 lb)   Height: 165.1 cm (65)   PainSc: Five     Body mass index is 36.94 kg/m?Marland Kitchen     Physical Exam  Vitals reviewed.   Pulmonary:      Effort: Pulmonary effort is normal.   Chest:      Comments: Bilateral breast reduction with wise pattern incisions. Right breast incisions clean, dry and intact. No s/s infection or fluid collections. Right NAC viable, no sensate. Improving eschars along left IMF incision and superficial wound noted along IMF, fibrinous exudate noted. Erythema along left IMF. No s/s fluid collections. Left NAC viable w/sensate  Abdominal:      Comments: Liposuction sites dry and intact. No s/s infection or fluid collections. Expected firm areas noted along liposuction sites. Improved contour   Skin:     General: Skin is warm and dry.   Neurological:      Mental Status: She is alert and oriented to person, place, and time.   Psychiatric:         Mood and Affect: Mood normal.              Assessment and Plan:  11.22.21 s/p bilateral breast reduction and liposuction to lateral chest, abdomen and flanks. She continues to have eschars and superficial wound along left IMF incision, possibly reaction from barbed suture. This is improving since her last visit. I would like to start her on Prisma to expedite wound healing. I am also going to order Nystatin powder for her to use along left IMF as her skin has some redness along this area from drainage d/t superficial wound. No s/s infection    - Will start on Prisma. Prisma and tegaderm placed today and instructed on use. Informed her that her body will absorb the prisma over the next 1-2 days, when she no longer sees the prisma in the wound then she will need to place new prisma and tegaderm over the wound. This was demonstrated to her. She expressed understanding.   - Nystatin powder ordered. I want her to use this along left IMF as she does have some redness to her skin. We advised if her skin starts to get irritated from the tegaderm, then to stop for a few days. Use the mupiricin and nystatin powder. Also, she can use an ABD or maxi pad instead of tegaderm to help hold prisma in place  - Aquaphor or Vaseline to healed areas on breasts  - Photos today  - May stop wearing compression   - Start massaging liposuction sites to help break up scar tissue  - Follow up in 2 weeks with Elease Hashimoto APRN-NP

## 2020-08-27 ENCOUNTER — Encounter: Admit: 2020-08-27 | Discharge: 2020-08-27 | Payer: PRIVATE HEALTH INSURANCE

## 2020-08-27 DIAGNOSIS — F4310 Post-traumatic stress disorder, unspecified: Secondary | ICD-10-CM

## 2020-08-27 DIAGNOSIS — F418 Other specified anxiety disorders: Secondary | ICD-10-CM

## 2020-08-27 DIAGNOSIS — F341 Dysthymic disorder: Secondary | ICD-10-CM

## 2020-08-27 MED ORDER — DIVALPROEX 250 MG PO TBEC
ORAL_TABLET | Freq: Two times a day (BID) | 0 refills
Start: 2020-08-27 — End: ?

## 2020-09-05 ENCOUNTER — Encounter: Admit: 2020-09-05 | Discharge: 2020-09-05 | Payer: PRIVATE HEALTH INSURANCE

## 2020-09-05 ENCOUNTER — Ambulatory Visit: Admit: 2020-09-05 | Discharge: 2020-09-06 | Payer: Commercial Managed Care - HMO

## 2020-09-05 DIAGNOSIS — J302 Other seasonal allergic rhinitis: Secondary | ICD-10-CM

## 2020-09-05 DIAGNOSIS — K859 Acute pancreatitis without necrosis or infection, unspecified: Secondary | ICD-10-CM

## 2020-09-05 DIAGNOSIS — J449 Chronic obstructive pulmonary disease, unspecified: Secondary | ICD-10-CM

## 2020-09-05 DIAGNOSIS — E65 Localized adiposity: Secondary | ICD-10-CM

## 2020-09-05 DIAGNOSIS — T753XXA Motion sickness, initial encounter: Secondary | ICD-10-CM

## 2020-09-05 DIAGNOSIS — E7800 Pure hypercholesterolemia, unspecified: Secondary | ICD-10-CM

## 2020-09-05 DIAGNOSIS — F99 Mental disorder, not otherwise specified: Secondary | ICD-10-CM

## 2020-09-05 DIAGNOSIS — Z720 Tobacco use: Secondary | ICD-10-CM

## 2020-09-05 DIAGNOSIS — M549 Dorsalgia, unspecified: Secondary | ICD-10-CM

## 2020-09-05 DIAGNOSIS — M1990 Unspecified osteoarthritis, unspecified site: Secondary | ICD-10-CM

## 2020-09-05 DIAGNOSIS — G43909 Migraine, unspecified, not intractable, without status migrainosus: Secondary | ICD-10-CM

## 2020-09-05 DIAGNOSIS — R42 Dizziness and giddiness: Secondary | ICD-10-CM

## 2020-09-05 DIAGNOSIS — I10 Essential (primary) hypertension: Secondary | ICD-10-CM

## 2020-09-05 DIAGNOSIS — N62 Hypertrophy of breast: Principal | ICD-10-CM

## 2020-09-05 DIAGNOSIS — G5601 Carpal tunnel syndrome, right upper limb: Secondary | ICD-10-CM

## 2020-09-05 DIAGNOSIS — R519 Generalized headaches: Secondary | ICD-10-CM

## 2020-09-05 DIAGNOSIS — G4733 Obstructive sleep apnea (adult) (pediatric): Secondary | ICD-10-CM

## 2020-09-05 DIAGNOSIS — R0600 Dyspnea, unspecified: Secondary | ICD-10-CM

## 2020-09-05 DIAGNOSIS — Z411 Encounter for cosmetic surgery: Secondary | ICD-10-CM

## 2020-09-05 DIAGNOSIS — N3010 Interstitial cystitis (chronic) without hematuria: Secondary | ICD-10-CM

## 2020-09-05 NOTE — Progress Notes
Subjective:       History of Present Illness  Christina Garrison is a 50 y.o. female s/p bilateral breast reduction and liposuction to lateral chest, abdomen and flanks on 11.22.21.    Here for 6 week postop  She was started on prisma to left IMF wound at her last visit to expedite wound healing. Christina Garrison does not feel this has helped. She had noticed yellow to bloody drainage from the open areas. She is having more pain to left breast.               Review of Systems   Constitutional: Negative.    HENT: Negative.    Eyes: Negative.    Respiratory: Negative.    Cardiovascular: Negative.    Gastrointestinal: Negative.    Endocrine: Negative.    Genitourinary: Negative.    Musculoskeletal: Negative.    Skin: Negative.    Allergic/Immunologic: Negative.    Neurological: Negative.    Hematological: Negative.    Psychiatric/Behavioral: Negative.    All other systems reviewed and are negative.        Objective:         ? biotin 5,000 mcg TbDi Dissolve 2 tablets by mouth twice daily.   ? BYSTOLIC 5 mg tablet TAKE 1 TABLET BY MOUTH EVERY DAY   ? cholecalciferol (VITAMIN D-3) 1,000 units tablet Take 1,000 Units by mouth twice weekly.   ? clonazePAM (KLONOPIN) 0.5 mg tablet Take 1.5 tablets by mouth twice daily. (Patient taking differently: Take 0.75 mg by mouth daily.)   ? clonazePAM (KLONOPIN) 1 mg tablet Take one tablet by mouth as Needed (For driving). (Patient taking differently: Take 1 mg by mouth daily with dinner.)   ? cyanocobalamin (vitamin B-12) 2,500 mcg tab Take 1 tablet by mouth daily with breakfast.   ? diphenhydrAMINE (BENADRYL ALLERGY) 12.5 mg/5 mL oral solution Take 12.5 mg by mouth every 6 hours as needed.   ? divalproex (DEPAKOTE EC) 250 mg DR tablet TAKE 1 TABLET BY MOUTH TWICE A DAY WITH FOOD FOR BIPOLAR DEPRESSION   ? famotidine (PEPCID) 20 mg tablet Take 20 mg by mouth daily as needed.   ? multivit, Ca, min-FA-soy isofl 400-60 mcg-mg tab Take 1 tablet by mouth twice daily.   ? mupirocin (CENTANY) 2 % topical ointment Apply  topically to affected area twice daily.   ? nystatin (NYSTOP) 100,000 unit/g topical powder Apply one g topically to affected area four times daily.   ? nystatin/triamcinolone 100,000 unit/g / 0.1 % topical cream Apply  topically to affected area twice daily. Apply sparingly to area twice a day for 5 days followed by once a day for 5 days. (Patient taking differently: Apply  topically to affected area twice daily as needed. Apply sparingly to area twice a day for 5 days followed by once a day for 5 days.)   ? ondansetron (ZOFRAN ODT) 4 mg rapid dissolve tablet Dissolve one tablet by mouth every 8 hours as needed. Place on tongue to disolve.   ? pantoprazole DR (PROTONIX) 40 mg tablet TAKE 1 TABLET BY MOUTH EVERY DAY (Patient taking differently: Take 40 mg by mouth as Needed.)   ? Potassium 99 mg tab Take 1 tablet by mouth three times weekly.   ? prochlorperazine maleate (COMPAZINE) 10 mg tablet Take one tablet by mouth every 6 hours as needed for Nausea or Vomiting.   ? rosuvastatin (CRESTOR) 20 mg tablet TAKE 1 TABLET BY MOUTH EVERY DAY   ? scopolamine (TRANSDERM-SCOP)  1.5 mg 3 day patch Apply one patch to top of skin as directed every 72 hours. (Patient taking differently: Apply 1 patch to top of skin as directed as Needed.)   ? sertraline (ZOLOFT) 100 mg tablet TAKE 1 TABLET BY MOUTH EVERY DAY FOR DEPRESSION (Patient taking differently: Take 50 mg by mouth daily.)   ? sertraline (ZOLOFT) 25 mg tablet Take one tablet by mouth daily. For a total of 75mg  daily.  Indications: anxiousness associated with depression   ? verapamil (CALAN) 40 mg tablet TAKE 1/2 TABLET BY MOUTH IN THE MORNING AND TAKE 1 TABLET IN THE EVENING   ? vitamins, multi w/minerals 9 mg iron-400 mcg tab Take 1 tablet by mouth daily with breakfast.     Vitals:    09/05/20 1337   BP: 122/81   BP Source: Arm, Right Upper   Patient Position: Sitting   Pulse: 65   Temp: 36.4 ?C (97.5 ?F)   TempSrc: Skin   Weight: 100.8 kg (222 lb 3.2 oz)   Height: 165.1 cm (65)   PainSc: Six     Body mass index is 36.98 kg/m?Marland Kitchen     Physical Exam  Vitals reviewed.   Pulmonary:      Effort: Pulmonary effort is normal.   Chest:      Comments: Bilateral breast reduction with wise pattern incisions. Left T junction at [facility] measuring 3.5 x 1cm and 8cm X 0.5cm to left lateral IMF. No s/s infection. Left NAC viable w/sensate.  Right breast incisions clean, dry and intact. No s/s infection or fluid collections. Right NAC viable, no sensate.       Abdominal:      Comments: Liposuction sites dry and intact. No s/s infection or fluid collections. Expected firm areas noted along liposuction sites. Improved contour   Skin:     General: Skin is warm and dry.   Neurological:      Mental Status: She is alert and oriented to person, place, and time.   Psychiatric:         Mood and Affect: Mood normal.              Assessment and Plan:  11.22.21 s/p bilateral breast reduction and liposuction to lateral chest, abdomen and flanks. She continues to have delayed wound healing along left IMF incision from the barbed suture. We will start her on aqua cell ag to help heal the area. There are no s/s infection. She is not wearing a bra, we did discuss the importance of wearing a bra to help support the open wound so the incision is not pulling. We also discussed limiting chores or any activity that may strain the incisions    - Start aqua cell ag, order on [employer] as her insurance does not cover this. She will order this, it will come in on Monday. In the meantime, use the provided Algidex Ag packing strip, gauze and abd pad. Then use breast binder or bra for support. You may need to change the dressing one to two times per day. Dressing instructions provided to patient  - shower daily, use a washcloth to the open areas along the IMF to clean the wound. Then place aqua cell ag dressing  - I do recommend wearing a bra and place padding along the IMF to pad the incision so her bra does not rub the incision  - Limit activities and chores   - follow up 3 with Schawn Byas APRN-NP, sooner if any concerns  Cyndi Bender, APRN-NP

## 2020-09-12 ENCOUNTER — Encounter: Admit: 2020-09-12 | Discharge: 2020-09-12 | Payer: PRIVATE HEALTH INSURANCE

## 2020-09-13 ENCOUNTER — Encounter: Admit: 2020-09-13 | Discharge: 2020-09-13 | Payer: PRIVATE HEALTH INSURANCE

## 2020-09-13 DIAGNOSIS — N62 Hypertrophy of breast: Secondary | ICD-10-CM

## 2020-09-13 DIAGNOSIS — T8131XD Disruption of external operation (surgical) wound, not elsewhere classified, subsequent encounter: Secondary | ICD-10-CM

## 2020-09-13 NOTE — Progress Notes
Pt sent list of [place] agencies in network.  I called 9 agencies, all stating they did not have a license to practise in [place] or her county. I called [employer] for DME recommendations for negative pressure therapy.  I spoke with Terrace Arabia at [phone] ext [phone]  in the care coordination office.  She will request a care coordinator to accept case and provide the footwork for finding care for this patient.  I am faxing over orders for wound care with negative pressure therapy, H&P, op report and updated notes now to [phone].

## 2020-09-14 ENCOUNTER — Ambulatory Visit: Admit: 2020-09-14 | Discharge: 2020-09-15 | Payer: Commercial Managed Care - HMO

## 2020-09-14 ENCOUNTER — Encounter: Admit: 2020-09-14 | Discharge: 2020-09-14 | Payer: PRIVATE HEALTH INSURANCE

## 2020-09-14 DIAGNOSIS — F4310 Post-traumatic stress disorder, unspecified: Secondary | ICD-10-CM

## 2020-09-14 DIAGNOSIS — F341 Dysthymic disorder: Secondary | ICD-10-CM

## 2020-09-14 DIAGNOSIS — F418 Other specified anxiety disorders: Secondary | ICD-10-CM

## 2020-09-14 DIAGNOSIS — Z4889 Encounter for other specified surgical aftercare: Secondary | ICD-10-CM

## 2020-09-14 MED ORDER — SERTRALINE 100 MG PO TAB
50 mg | ORAL_TABLET | Freq: Every day | ORAL | 3 refills | Status: AC
Start: 2020-09-14 — End: ?

## 2020-09-14 NOTE — Progress Notes
Pt does c/o burning along and below her left breast incision.  Skin has been light purplish/pink.  Yesterday, noticed small area of drainage medial end of incision where skin was thin and also light purple. She is now using Bactorban, dressing to area. Here for measurements and photos.  #1 medial left breast  2.7cm x 4.0cm x 0.3   #2 left lateral breast 7.5cm x 0.8cm x 0.3cm depth  Both wounds have red granulation.   Judson Roch from [facility] called this am and has accepted pt to their service. She will be calling to arrange a time for wound vac change on Monday. Pt will return Wednesdays for the next 2 weeks otherwise, vac changes to be done by Home health.  Today, I lined some areas with duoderm to protect skin, mainly, the distal edge. The rest was protected with skin prep and drape.  We will see which is best for her skin. Pt instructed on use of wound vac, how to contact [place] service, charging.  All questions were answered today.

## 2020-09-14 NOTE — Telephone Encounter
Pharmacy Comments: "PATIENT REQUESTS NEW RX: PLEASE SEND NEW RX WITH CORRECT SIG FOR SERTRALINE 100MG  TAB. PT SAID SHE TAKES 1/2 TAB WITH ONE 25MG  TAB TO EQUAL 75MG  DAILY."

## 2020-09-18 ENCOUNTER — Encounter: Admit: 2020-09-18 | Discharge: 2020-09-18 | Payer: PRIVATE HEALTH INSURANCE

## 2020-09-18 MED ORDER — DOXYCYCLINE HYCLATE 100 MG PO TAB
100 mg | ORAL_TABLET | Freq: Two times a day (BID) | ORAL | 0 refills | 8.00000 days | Status: AC
Start: 2020-09-18 — End: ?

## 2020-09-18 NOTE — Progress Notes
[place] nurse Precious Gilding, RN called today. States saw Marene yesterday for vac change. some skin issues with adhesive but looks ok. New concern, right side open spot medially 0.4x 0.3 with an irritated area 3cm lateral. when she pressed on that, serosanguinous  fluid expressed out of the medial so it communicates with the medial opening on the right. She put tegaderm on it. I notified Dr. Keenan Bachelor.  Pt to use Bactroban twice a day on the right. She also ordered Doxycycline BID for 2 weeks. I called pt.  The Tegaderm came off.  She will use Bactroban and gauze on the right for now until seen tomorrow.  The patient will pick up the doxycycline and start this evening.

## 2020-09-19 ENCOUNTER — Encounter: Admit: 2020-09-19 | Discharge: 2020-09-19 | Payer: PRIVATE HEALTH INSURANCE

## 2020-09-19 ENCOUNTER — Ambulatory Visit: Admit: 2020-09-19 | Discharge: 2020-09-19 | Payer: Commercial Managed Care - HMO

## 2020-09-19 DIAGNOSIS — T753XXA Motion sickness, initial encounter: Secondary | ICD-10-CM

## 2020-09-19 DIAGNOSIS — I10 Essential (primary) hypertension: Secondary | ICD-10-CM

## 2020-09-19 DIAGNOSIS — N62 Hypertrophy of breast: Secondary | ICD-10-CM

## 2020-09-19 DIAGNOSIS — R0600 Dyspnea, unspecified: Secondary | ICD-10-CM

## 2020-09-19 DIAGNOSIS — F99 Mental disorder, not otherwise specified: Secondary | ICD-10-CM

## 2020-09-19 DIAGNOSIS — T148XXD Other injury of unspecified body region, subsequent encounter: Secondary | ICD-10-CM

## 2020-09-19 DIAGNOSIS — G5601 Carpal tunnel syndrome, right upper limb: Secondary | ICD-10-CM

## 2020-09-19 DIAGNOSIS — G4733 Obstructive sleep apnea (adult) (pediatric): Secondary | ICD-10-CM

## 2020-09-19 DIAGNOSIS — G43909 Migraine, unspecified, not intractable, without status migrainosus: Secondary | ICD-10-CM

## 2020-09-19 DIAGNOSIS — M1990 Unspecified osteoarthritis, unspecified site: Secondary | ICD-10-CM

## 2020-09-19 DIAGNOSIS — E7800 Pure hypercholesterolemia, unspecified: Secondary | ICD-10-CM

## 2020-09-19 DIAGNOSIS — R519 Generalized headaches: Secondary | ICD-10-CM

## 2020-09-19 DIAGNOSIS — N61 Mastitis without abscess: Secondary | ICD-10-CM

## 2020-09-19 DIAGNOSIS — M549 Dorsalgia, unspecified: Secondary | ICD-10-CM

## 2020-09-19 DIAGNOSIS — J449 Chronic obstructive pulmonary disease, unspecified: Secondary | ICD-10-CM

## 2020-09-19 DIAGNOSIS — N3010 Interstitial cystitis (chronic) without hematuria: Secondary | ICD-10-CM

## 2020-09-19 DIAGNOSIS — R42 Dizziness and giddiness: Secondary | ICD-10-CM

## 2020-09-19 DIAGNOSIS — J302 Other seasonal allergic rhinitis: Secondary | ICD-10-CM

## 2020-09-19 DIAGNOSIS — K859 Acute pancreatitis without necrosis or infection, unspecified: Secondary | ICD-10-CM

## 2020-09-19 DIAGNOSIS — Z720 Tobacco use: Secondary | ICD-10-CM

## 2020-09-19 MED ORDER — MUPIROCIN 2 % TP OINT
Freq: Two times a day (BID) | TOPICAL | 1 refills | 11.00000 days | Status: AC
Start: 2020-09-19 — End: ?

## 2020-09-19 NOTE — Progress Notes
Subjective:       History of Present Illness  Tayjah E Bueti is a 50 y.o. female s/p bilateral breast reduction and liposuction to lateral chest, abdomen and flanks on 11.22.21.    Here today for follow up  She was started on wound vac last week d/t non-healing wound on left IMF. Home health changes her wound vac on Monday and Friday's. She notes small opening to right IMF on Saturday (1.15.22) with serosangious drainage. She was started on doxycycline yesterday, and was instructed to use mupirocin to right wound. She does not feel the right wound has improved. She has only had 2 doses of doxycycline. Denies fevers, shakes, or chills               Review of Systems   Constitutional: Negative.    HENT: Negative.    Eyes: Negative.    Respiratory: Negative.    Cardiovascular: Negative.    Gastrointestinal: Negative.    Endocrine: Negative.    Genitourinary: Negative.    Musculoskeletal: Negative.    Skin: Negative.    Allergic/Immunologic: Negative.    Neurological: Negative.    Hematological: Negative.    Psychiatric/Behavioral: Negative.    All other systems reviewed and are negative.        Objective:         ? biotin 5,000 mcg TbDi Dissolve 2 tablets by mouth twice daily.   ? BYSTOLIC 5 mg tablet TAKE 1 TABLET BY MOUTH EVERY DAY   ? cholecalciferol (VITAMIN D-3) 1,000 units tablet Take 1,000 Units by mouth twice weekly.   ? clonazePAM (KLONOPIN) 0.5 mg tablet Take 1.5 tablets by mouth twice daily. (Patient taking differently: Take 0.75 mg by mouth daily.)   ? clonazePAM (KLONOPIN) 1 mg tablet Take one tablet by mouth as Needed (For driving). (Patient taking differently: Take 1 mg by mouth daily with dinner.)   ? cyanocobalamin (vitamin B-12) 2,500 mcg tab Take 1 tablet by mouth daily with breakfast.   ? diphenhydrAMINE (BENADRYL ALLERGY) 12.5 mg/5 mL oral solution Take 12.5 mg by mouth every 6 hours as needed.   ? divalproex (DEPAKOTE EC) 250 mg DR tablet TAKE 1 TABLET BY MOUTH TWICE A DAY WITH FOOD FOR BIPOLAR DEPRESSION   ? doxycycline hyclate (VIBRACIN) 100 mg tablet Take one tablet by mouth twice daily.   ? famotidine (PEPCID) 20 mg tablet Take 20 mg by mouth daily as needed.   ? multivit, Ca, min-FA-soy isofl 400-60 mcg-mg tab Take 1 tablet by mouth twice daily.   ? mupirocin (CENTANY) 2 % topical ointment Apply  topically to affected area twice daily.   ? nystatin (NYSTOP) 100,000 unit/g topical powder Apply one g topically to affected area four times daily.   ? nystatin/triamcinolone 100,000 unit/g / 0.1 % topical cream Apply  topically to affected area twice daily. Apply sparingly to area twice a day for 5 days followed by once a day for 5 days. (Patient taking differently: Apply  topically to affected area twice daily as needed. Apply sparingly to area twice a day for 5 days followed by once a day for 5 days.)   ? ondansetron (ZOFRAN ODT) 4 mg rapid dissolve tablet Dissolve one tablet by mouth every 8 hours as needed. Place on tongue to disolve.   ? pantoprazole DR (PROTONIX) 40 mg tablet TAKE 1 TABLET BY MOUTH EVERY DAY (Patient taking differently: Take 40 mg by mouth as Needed.)   ? Potassium 99 mg tab Take 1 tablet by  mouth three times weekly.   ? prochlorperazine maleate (COMPAZINE) 10 mg tablet Take one tablet by mouth every 6 hours as needed for Nausea or Vomiting.   ? rosuvastatin (CRESTOR) 20 mg tablet TAKE 1 TABLET BY MOUTH EVERY DAY   ? scopolamine (TRANSDERM-SCOP) 1.5 mg 3 day patch Apply one patch to top of skin as directed every 72 hours. (Patient taking differently: Apply 1 patch to top of skin as directed as Needed.)   ? sertraline (ZOLOFT) 100 mg tablet Take one-half tablet by mouth daily. Take with 25 mg for total 75 mg dose.   ? sertraline (ZOLOFT) 25 mg tablet Take one tablet by mouth daily. For a total of 75mg  daily.  Indications: anxiousness associated with depression   ? verapamil (CALAN) 40 mg tablet TAKE 1/2 TABLET BY MOUTH IN THE MORNING AND TAKE 1 TABLET IN THE EVENING   ? vitamins, multi w/minerals 9 mg iron-400 mcg tab Take 1 tablet by mouth daily with breakfast.     Vitals:    09/19/20 1352   BP: 105/72   BP Source: Arm, Right Upper   Patient Position: Sitting   Pulse: 68   Temp: 36.5 ?C (97.7 ?F)   TempSrc: Skin   Weight: 102.2 kg (225 lb 3.2 oz)   Height: 165.1 cm (65)   PainSc: Seven     Body mass index is 37.48 kg/m?Marland Kitchen     Physical Exam  Vitals reviewed.   Pulmonary:      Effort: Pulmonary effort is normal.   Chest:      Comments: Bilateral breast reduction with wise pattern incisions. Left lateral breast IMF incision wound measurements 7.3 x 0.6 and medial wound measurements 3.3 x 1.9. Right IMF wound 0.4 x 0.8 x 0.25 (depth), 0.5 tunneling to lateral IMF at 9 o'clock position, erythema noted along IMF.      Bilateral      Left breast      Right breast            Skin:     General: Skin is warm and dry.   Neurological:      Mental Status: She is alert and oriented to person, place, and time.   Psychiatric:         Mood and Affect: Mood normal.              Assessment and Plan:  11.22.21 s/p bilateral breast reduction and liposuction to lateral chest, abdomen and flanks. Complicated by non-healing wound to left IMF and new opening to right IMF. I am concerned she may have superficial cellulitis infection to right breast noted by the erythema along IMF. She was started on doxycycline yesterday for this. She has only had two doses of doxy since starting the abx. I would like to continue the doxy as she has only been on this for less than 24 hours. I did discuss worsening s/s of infection including fevers, shakes, chills, increase in redness or foul drainage. If she experiences any worsening symptoms, then call right away. I do anticipate that the small wound along right IMF may open where the wound is tunneling. If this wound opens by Friday, then the home health nurse may apply a wound vac to the right breast opening. The nurse can use a y connector to connect to her existing wound vac.    - left breast wound vac change today  - order placed for mupirocin ointment bid to right breast wound. I recommend wicking small strip  of plain gauze packing with mupirocin ointment into the wound  - follow up as scheduled in 1 week      Cyndi Bender, APRN-NP

## 2020-09-19 NOTE — Progress Notes
Subjective:       History of Present Illness  Christina Garrison is a 50 y.o. female.       Review of Systems   Constitutional: Negative.    HENT: Negative.    Eyes: Negative.    Respiratory: Negative.    Cardiovascular: Negative.    Gastrointestinal: Negative.    Endocrine: Negative.    Genitourinary: Negative.    Musculoskeletal: Negative.    Skin: Negative.    Allergic/Immunologic: Negative.    Neurological: Negative.    Hematological: Negative.    Psychiatric/Behavioral: Negative.          Objective:         ? biotin 5,000 mcg TbDi Dissolve 2 tablets by mouth twice daily.   ? BYSTOLIC 5 mg tablet TAKE 1 TABLET BY MOUTH EVERY DAY   ? cholecalciferol (VITAMIN D-3) 1,000 units tablet Take 1,000 Units by mouth twice weekly.   ? clonazePAM (KLONOPIN) 0.5 mg tablet Take 1.5 tablets by mouth twice daily. (Patient taking differently: Take 0.75 mg by mouth daily.)   ? clonazePAM (KLONOPIN) 1 mg tablet Take one tablet by mouth as Needed (For driving). (Patient taking differently: Take 1 mg by mouth daily with dinner.)   ? cyanocobalamin (vitamin B-12) 2,500 mcg tab Take 1 tablet by mouth daily with breakfast.   ? diphenhydrAMINE (BENADRYL ALLERGY) 12.5 mg/5 mL oral solution Take 12.5 mg by mouth every 6 hours as needed.   ? divalproex (DEPAKOTE EC) 250 mg DR tablet TAKE 1 TABLET BY MOUTH TWICE A DAY WITH FOOD FOR BIPOLAR DEPRESSION   ? doxycycline hyclate (VIBRACIN) 100 mg tablet Take one tablet by mouth twice daily.   ? famotidine (PEPCID) 20 mg tablet Take 20 mg by mouth daily as needed.   ? multivit, Ca, min-FA-soy isofl 400-60 mcg-mg tab Take 1 tablet by mouth twice daily.   ? mupirocin (CENTANY) 2 % topical ointment Apply  topically to affected area twice daily.   ? nystatin (NYSTOP) 100,000 unit/g topical powder Apply one g topically to affected area four times daily.   ? nystatin/triamcinolone 100,000 unit/g / 0.1 % topical cream Apply  topically to affected area twice daily. Apply sparingly to area twice a day for 5 days followed by once a day for 5 days. (Patient taking differently: Apply  topically to affected area twice daily as needed. Apply sparingly to area twice a day for 5 days followed by once a day for 5 days.)   ? ondansetron (ZOFRAN ODT) 4 mg rapid dissolve tablet Dissolve one tablet by mouth every 8 hours as needed. Place on tongue to disolve.   ? pantoprazole DR (PROTONIX) 40 mg tablet TAKE 1 TABLET BY MOUTH EVERY DAY (Patient taking differently: Take 40 mg by mouth as Needed.)   ? Potassium 99 mg tab Take 1 tablet by mouth three times weekly.   ? prochlorperazine maleate (COMPAZINE) 10 mg tablet Take one tablet by mouth every 6 hours as needed for Nausea or Vomiting.   ? rosuvastatin (CRESTOR) 20 mg tablet TAKE 1 TABLET BY MOUTH EVERY DAY   ? scopolamine (TRANSDERM-SCOP) 1.5 mg 3 day patch Apply one patch to top of skin as directed every 72 hours. (Patient taking differently: Apply 1 patch to top of skin as directed as Needed.)   ? sertraline (ZOLOFT) 100 mg tablet Take one-half tablet by mouth daily. Take with 25 mg for total 75 mg dose.   ? sertraline (ZOLOFT) 25 mg tablet Take one tablet by  mouth daily. For a total of 75mg  daily.  Indications: anxiousness associated with depression   ? verapamil (CALAN) 40 mg tablet TAKE 1/2 TABLET BY MOUTH IN THE MORNING AND TAKE 1 TABLET IN THE EVENING   ? vitamins, multi w/minerals 9 mg iron-400 mcg tab Take 1 tablet by mouth daily with breakfast.     Vitals:    09/19/20 1352   BP: 105/72   BP Source: Arm, Right Upper   Patient Position: Sitting   Pulse: 68   Temp: 36.5 ?C (97.7 ?F)   TempSrc: Skin   Weight: 102.2 kg (225 lb 3.2 oz)   Height: 165.1 cm (65)   PainSc: Seven     Body mass index is 37.48 kg/m?Marland Kitchen     Physical Exam         Assessment and Plan:

## 2020-09-21 ENCOUNTER — Encounter: Admit: 2020-09-21 | Discharge: 2020-09-21 | Payer: PRIVATE HEALTH INSURANCE

## 2020-09-26 ENCOUNTER — Encounter: Admit: 2020-09-26 | Discharge: 2020-09-26 | Payer: PRIVATE HEALTH INSURANCE

## 2020-09-26 ENCOUNTER — Ambulatory Visit: Admit: 2020-09-26 | Discharge: 2020-09-27 | Payer: Commercial Managed Care - HMO

## 2020-09-26 DIAGNOSIS — J302 Other seasonal allergic rhinitis: Secondary | ICD-10-CM

## 2020-09-26 DIAGNOSIS — G4733 Obstructive sleep apnea (adult) (pediatric): Secondary | ICD-10-CM

## 2020-09-26 DIAGNOSIS — K859 Acute pancreatitis without necrosis or infection, unspecified: Secondary | ICD-10-CM

## 2020-09-26 DIAGNOSIS — R519 Generalized headaches: Secondary | ICD-10-CM

## 2020-09-26 DIAGNOSIS — I10 Essential (primary) hypertension: Secondary | ICD-10-CM

## 2020-09-26 DIAGNOSIS — N3010 Interstitial cystitis (chronic) without hematuria: Secondary | ICD-10-CM

## 2020-09-26 DIAGNOSIS — G5601 Carpal tunnel syndrome, right upper limb: Secondary | ICD-10-CM

## 2020-09-26 DIAGNOSIS — E7800 Pure hypercholesterolemia, unspecified: Secondary | ICD-10-CM

## 2020-09-26 DIAGNOSIS — J449 Chronic obstructive pulmonary disease, unspecified: Secondary | ICD-10-CM

## 2020-09-26 DIAGNOSIS — T148XXD Other injury of unspecified body region, subsequent encounter: Secondary | ICD-10-CM

## 2020-09-26 DIAGNOSIS — F99 Mental disorder, not otherwise specified: Secondary | ICD-10-CM

## 2020-09-26 DIAGNOSIS — G43909 Migraine, unspecified, not intractable, without status migrainosus: Secondary | ICD-10-CM

## 2020-09-26 DIAGNOSIS — M1990 Unspecified osteoarthritis, unspecified site: Secondary | ICD-10-CM

## 2020-09-26 DIAGNOSIS — R0600 Dyspnea, unspecified: Secondary | ICD-10-CM

## 2020-09-26 DIAGNOSIS — Z720 Tobacco use: Secondary | ICD-10-CM

## 2020-09-26 DIAGNOSIS — R42 Dizziness and giddiness: Secondary | ICD-10-CM

## 2020-09-26 DIAGNOSIS — N62 Hypertrophy of breast: Principal | ICD-10-CM

## 2020-09-26 DIAGNOSIS — T753XXA Motion sickness, initial encounter: Secondary | ICD-10-CM

## 2020-09-26 DIAGNOSIS — M549 Dorsalgia, unspecified: Secondary | ICD-10-CM

## 2020-09-26 NOTE — Progress Notes
Subjective:       History of Present Illness  Christina Garrison is a 49 y.o. female s/p bilateral breast reduction and liposuction to lateral chest, abdomen and flanks on 11.22.21    Here today for wound vac change to left breast wound. She is doing well. She is still on doxycycline that was start last week. Says home health started bridging her right IMF wound last Friday and feels this has improved.                Review of Systems   Constitutional: Negative.    HENT: Negative.    Eyes: Negative.    Respiratory: Negative.    Cardiovascular: Negative.    Gastrointestinal: Negative.    Endocrine: Negative.    Genitourinary: Negative.    Musculoskeletal: Negative.    Skin: Negative.    Allergic/Immunologic: Negative.    Neurological: Negative.    Hematological: Negative.    Psychiatric/Behavioral: Negative.    All other systems reviewed and are negative.        Objective:         ? biotin 5,000 mcg TbDi Dissolve 2 tablets by mouth twice daily.   ? BYSTOLIC 5 mg tablet TAKE 1 TABLET BY MOUTH EVERY DAY   ? cholecalciferol (VITAMIN D-3) 1,000 units tablet Take 1,000 Units by mouth twice weekly.   ? clonazePAM (KLONOPIN) 0.5 mg tablet Take 1.5 tablets by mouth twice daily. (Patient taking differently: Take 0.75 mg by mouth daily.)   ? clonazePAM (KLONOPIN) 1 mg tablet Take one tablet by mouth as Needed (For driving). (Patient taking differently: Take 1 mg by mouth daily with dinner.)   ? cyanocobalamin (vitamin B-12) 2,500 mcg tab Take 1 tablet by mouth daily with breakfast.   ? diphenhydrAMINE (BENADRYL ALLERGY) 12.5 mg/5 mL oral solution Take 12.5 mg by mouth every 6 hours as needed.   ? divalproex (DEPAKOTE EC) 250 mg DR tablet TAKE 1 TABLET BY MOUTH TWICE A DAY WITH FOOD FOR BIPOLAR DEPRESSION   ? doxycycline hyclate (VIBRACIN) 100 mg tablet Take one tablet by mouth twice daily.   ? famotidine (PEPCID) 20 mg tablet Take 20 mg by mouth daily as needed.   ? multivit, Ca, min-FA-soy isofl 400-60 mcg-mg tab Take 1 tablet by mouth twice daily.   ? mupirocin (CENTANY) 2 % topical ointment Apply  topically to affected area twice daily.   ? nystatin (NYSTOP) 100,000 unit/g topical powder Apply one g topically to affected area four times daily.   ? nystatin/triamcinolone 100,000 unit/g / 0.1 % topical cream Apply  topically to affected area twice daily. Apply sparingly to area twice a day for 5 days followed by once a day for 5 days. (Patient taking differently: Apply  topically to affected area twice daily as needed. Apply sparingly to area twice a day for 5 days followed by once a day for 5 days.)   ? ondansetron (ZOFRAN ODT) 4 mg rapid dissolve tablet Dissolve one tablet by mouth every 8 hours as needed. Place on tongue to disolve.   ? pantoprazole DR (PROTONIX) 40 mg tablet TAKE 1 TABLET BY MOUTH EVERY DAY (Patient taking differently: Take 40 mg by mouth as Needed.)   ? Potassium 99 mg tab Take 1 tablet by mouth three times weekly.   ? prochlorperazine maleate (COMPAZINE) 10 mg tablet Take one tablet by mouth every 6 hours as needed for Nausea or Vomiting.   ? rosuvastatin (CRESTOR) 20 mg tablet TAKE 1 TABLET BY  MOUTH EVERY DAY   ? scopolamine (TRANSDERM-SCOP) 1.5 mg 3 day patch Apply one patch to top of skin as directed every 72 hours. (Patient taking differently: Apply 1 patch to top of skin as directed as Needed.)   ? sertraline (ZOLOFT) 100 mg tablet Take one-half tablet by mouth daily. Take with 25 mg for total 75 mg dose.   ? sertraline (ZOLOFT) 25 mg tablet Take one tablet by mouth daily. For a total of 75mg  daily.  Indications: anxiousness associated with depression   ? verapamil (CALAN) 40 mg tablet TAKE 1/2 TABLET BY MOUTH IN THE MORNING AND TAKE 1 TABLET IN THE EVENING   ? vitamins, multi w/minerals 9 mg iron-400 mcg tab Take 1 tablet by mouth daily with breakfast.     Vitals:    09/26/20 1407   Weight: 102.1 kg (225 lb)   Height: 165.1 cm (65)   PainSc: Five     Body mass index is 37.44 kg/m?Marland Kitchen     Physical Exam  Chest: Comments: Bilateral breast reduction with wise pattern incisions. Left lateral breast IMF incision wound measurements 4.5 x 0.5cm and medial wound measurements 2.5 x 1.5cm . Right IMF wound 1cm in length, no tunneling. Improving erythema to IMF               Assessment and Plan:  11.22.21 s/p bilateral breast reduction and liposuction to lateral chest, abdomen and flanks     Doing well      - continue doxycycline   - Follow up in 2 weeks

## 2020-10-10 ENCOUNTER — Encounter

## 2020-10-10 DIAGNOSIS — J449 Chronic obstructive pulmonary disease, unspecified: Secondary | ICD-10-CM

## 2020-10-10 DIAGNOSIS — M1990 Unspecified osteoarthritis, unspecified site: Secondary | ICD-10-CM

## 2020-10-10 DIAGNOSIS — L299 Pruritus, unspecified: Principal | ICD-10-CM

## 2020-10-10 DIAGNOSIS — K859 Acute pancreatitis without necrosis or infection, unspecified: Secondary | ICD-10-CM

## 2020-10-10 DIAGNOSIS — R0600 Dyspnea, unspecified: Secondary | ICD-10-CM

## 2020-10-10 DIAGNOSIS — M549 Dorsalgia, unspecified: Secondary | ICD-10-CM

## 2020-10-10 DIAGNOSIS — G4733 Obstructive sleep apnea (adult) (pediatric): Secondary | ICD-10-CM

## 2020-10-10 DIAGNOSIS — I10 Essential (primary) hypertension: Secondary | ICD-10-CM

## 2020-10-10 DIAGNOSIS — G43909 Migraine, unspecified, not intractable, without status migrainosus: Secondary | ICD-10-CM

## 2020-10-10 DIAGNOSIS — J302 Other seasonal allergic rhinitis: Secondary | ICD-10-CM

## 2020-10-10 DIAGNOSIS — Z720 Tobacco use: Secondary | ICD-10-CM

## 2020-10-10 DIAGNOSIS — R519 Generalized headaches: Secondary | ICD-10-CM

## 2020-10-10 DIAGNOSIS — N3010 Interstitial cystitis (chronic) without hematuria: Secondary | ICD-10-CM

## 2020-10-10 DIAGNOSIS — F99 Mental disorder, not otherwise specified: Secondary | ICD-10-CM

## 2020-10-10 DIAGNOSIS — G5601 Carpal tunnel syndrome, right upper limb: Secondary | ICD-10-CM

## 2020-10-10 DIAGNOSIS — T753XXA Motion sickness, initial encounter: Secondary | ICD-10-CM

## 2020-10-10 DIAGNOSIS — E7800 Pure hypercholesterolemia, unspecified: Secondary | ICD-10-CM

## 2020-10-10 DIAGNOSIS — R42 Dizziness and giddiness: Secondary | ICD-10-CM

## 2020-10-10 LAB — COMPREHENSIVE METABOLIC PANEL
Lab: 0.3 mg/dL (ref 0.3–1.2)
Lab: 0.6 mg/dL (ref 0.4–1.00)
Lab: 102 MMOL/L (ref 98–110)
Lab: 11 (ref 3–12)
Lab: 13 mg/dL (ref 7–25)
Lab: 14 U/L (ref 7–56)
Lab: 141 MMOL/L (ref 137–147)
Lab: 16 U/L (ref 7–40)
Lab: 28 MMOL/L (ref 21–30)
Lab: 4.1 g/dL (ref 3.5–5.0)
Lab: 4.4 MMOL/L (ref 3.5–5.1)
Lab: 6.2 g/dL (ref 6.0–8.0)
Lab: 74 mg/dL (ref 70–100)
Lab: 83 U/L (ref 25–110)
Lab: >60 mL/min (ref >60)

## 2020-10-10 LAB — CBC
Lab: 10 K/UL (ref 4.5–11.0)
Lab: 277 K/UL (ref 150–400)
Lab: 4.1 M/UL (ref 4.0–5.0)

## 2020-10-10 MED ORDER — NYSTATIN-TRIAMCINOLONE 100,000-0.1 UNIT/G-% TP CREA
Freq: Two times a day (BID) | TOPICAL | 0 refills | 25.00000 days | Status: AC | PRN
Start: 2020-10-10 — End: ?

## 2020-10-10 NOTE — Progress Notes
Subjective:       History of Present Illness  Christina Garrison is a 50 y.o. female.  She is coming in for her wound check.   Has been using the VAC.   Had drainage from the lateral aspect of her left breast on Friday. That part was not covered with a VAC.   Notices an odor from the left breast.     Has itching on her soles of her feet and in her hair.   Says she has a rash in these areas.  Uses hydrocortisone cream  6 times a day to her feet and ice.        Review of Systems   Constitutional: Negative.    HENT: Negative.    Eyes: Negative.    Respiratory: Negative.    Cardiovascular: Negative.    Gastrointestinal: Negative.    Endocrine: Negative.    Genitourinary: Negative.    Musculoskeletal: Negative.    Skin: Negative.    Allergic/Immunologic: Negative.    Neurological: Negative.    Hematological: Negative.    Psychiatric/Behavioral: Negative.          Objective:         ? biotin 5,000 mcg TbDi Dissolve 2 tablets by mouth twice daily.   ? BYSTOLIC 5 mg tablet TAKE 1 TABLET BY MOUTH EVERY DAY   ? cholecalciferol (VITAMIN D-3) 1,000 units tablet Take 1,000 Units by mouth twice weekly.   ? clonazePAM (KLONOPIN) 0.5 mg tablet Take 1.5 tablets by mouth twice daily. (Patient taking differently: Take 0.75 mg by mouth daily.)   ? clonazePAM (KLONOPIN) 1 mg tablet Take one tablet by mouth as Needed (For driving). (Patient taking differently: Take 1 mg by mouth daily with dinner.)   ? cyanocobalamin (vitamin B-12) 2,500 mcg tab Take 1 tablet by mouth daily with breakfast.   ? diphenhydrAMINE (BENADRYL ALLERGY) 12.5 mg/5 mL oral solution Take 12.5 mg by mouth every 6 hours as needed.   ? divalproex (DEPAKOTE EC) 250 mg DR tablet TAKE 1 TABLET BY MOUTH TWICE A DAY WITH FOOD FOR BIPOLAR DEPRESSION   ? doxycycline hyclate (VIBRACIN) 100 mg tablet Take one tablet by mouth twice daily.   ? famotidine (PEPCID) 20 mg tablet Take 20 mg by mouth daily as needed.   ? multivit, Ca, min-FA-soy isofl 400-60 mcg-mg tab Take 1 tablet by mouth twice daily.   ? mupirocin (CENTANY) 2 % topical ointment Apply  topically to affected area twice daily.   ? nystatin (NYSTOP) 100,000 unit/g topical powder Apply one g topically to affected area four times daily.   ? nystatin-triamcinolone 100,000 unit/g / 0.1 % topical cream Apply  topically to affected area twice daily as needed. Apply sparingly to area twice a day for 5 days followed by once a day for 5 days.   ? ondansetron (ZOFRAN ODT) 4 mg rapid dissolve tablet Dissolve one tablet by mouth every 8 hours as needed. Place on tongue to disolve.   ? pantoprazole DR (PROTONIX) 40 mg tablet TAKE 1 TABLET BY MOUTH EVERY DAY (Patient taking differently: Take 40 mg by mouth as Needed.)   ? Potassium 99 mg tab Take 1 tablet by mouth three times weekly.   ? prochlorperazine maleate (COMPAZINE) 10 mg tablet Take one tablet by mouth every 6 hours as needed for Nausea or Vomiting.   ? rosuvastatin (CRESTOR) 20 mg tablet TAKE 1 TABLET BY MOUTH EVERY DAY   ? scopolamine (TRANSDERM-SCOP) 1.5 mg 3 day patch Apply one  patch to top of skin as directed every 72 hours. (Patient taking differently: Apply 1 patch to top of skin as directed as Needed.)   ? sertraline (ZOLOFT) 100 mg tablet Take one-half tablet by mouth daily. Take with 25 mg for total 75 mg dose.   ? sertraline (ZOLOFT) 25 mg tablet Take one tablet by mouth daily. For a total of 75mg  daily.  Indications: anxiousness associated with depression   ? verapamil (CALAN) 40 mg tablet TAKE 1/2 TABLET BY MOUTH IN THE MORNING AND TAKE 1 TABLET IN THE EVENING   ? vitamins, multi w/minerals 9 mg iron-400 mcg tab Take 1 tablet by mouth daily with breakfast.     Vitals:    10/10/20 1422   BP: 132/84   BP Source: Arm, Left Upper   Patient Position: Sitting   Pulse: 68   Temp: 36.6 ?C (97.9 ?F)   TempSrc: Skin   Weight: 101.6 kg (224 lb)   Height: 165.1 cm (65)   PainSc: Four     Body mass index is 37.28 kg/m?Marland Kitchen     Physical Exam  Vitals reviewed.   HENT:      Head: Normocephalic.   Cardiovascular:      Rate and Rhythm: Normal rate.   Pulmonary:      Effort: Pulmonary effort is normal.   Chest:          Comments: Left breast center wound is superficial.   1.6 cm vertical x 2.3 cm horizontal  The lateral aspect is nearly completely healed. No fluid collection or fluctuance.     Skin surrounding the wound is moist, no overt punctate rash.   Skin:     Comments: No bullae, no petechiae, no blisters on her feet.   No rash visible on her posterior neck. She has it on her posterior scalp, but I could not see any today.    Neurological:      Mental Status: She is alert.              Assessment and Plan:    I feel she would benefit from the [facility] being removed soon. We need to allow epithelialization.   However, she had a lot of drainage and wants to have the VAC present.    She cannot return next week. She will return in 2 weeks to see our NP. If the drainage decreases, we can stop the VAC and allow epithelialization.     Regarding her itching. She may need to use an antifungal to her feet.  Wrote for triamcinolone/nystatin for the rash around her VAC. Can use on her feet.  Also ordered labs, Chem 12 and CBC to make sure no underlying abnormality.     I cannot see a definitive rash on her soles or scalp or palms.     Her body ph may be off since she was on an antibiotic. She has not healed her breast reduction in a normal fashion. This may be reflective of a vitamin deficiency.     Problem List Items Addressed This Visit     None      Visit Diagnoses     Itching    -  Primary    Relevant Orders    COMPREHENSIVE METABOLIC PANEL    CBC

## 2020-10-10 NOTE — Telephone Encounter
Faxed update to [facility]. Spoke with Production assistant, radio on the phone. Pt will return on 2/23.  HH to provide wound vac changes until then.  If rash worsens, pt may have a wound vac break for 3-7 days. Dr. Keenan Bachelor ordered labs and topical triamcinolone/nystatin cream

## 2020-10-11 ENCOUNTER — Encounter

## 2020-10-11 DIAGNOSIS — I10 Essential (primary) hypertension: Secondary | ICD-10-CM

## 2020-10-11 DIAGNOSIS — J302 Other seasonal allergic rhinitis: Secondary | ICD-10-CM

## 2020-10-11 DIAGNOSIS — F418 Other specified anxiety disorders: Secondary | ICD-10-CM

## 2020-10-11 DIAGNOSIS — F4310 Post-traumatic stress disorder, unspecified: Secondary | ICD-10-CM

## 2020-10-11 DIAGNOSIS — R42 Dizziness and giddiness: Secondary | ICD-10-CM

## 2020-10-11 DIAGNOSIS — F99 Mental disorder, not otherwise specified: Secondary | ICD-10-CM

## 2020-10-11 DIAGNOSIS — F341 Dysthymic disorder: Secondary | ICD-10-CM

## 2020-10-11 DIAGNOSIS — M549 Dorsalgia, unspecified: Secondary | ICD-10-CM

## 2020-10-11 DIAGNOSIS — J449 Chronic obstructive pulmonary disease, unspecified: Secondary | ICD-10-CM

## 2020-10-11 DIAGNOSIS — G5601 Carpal tunnel syndrome, right upper limb: Secondary | ICD-10-CM

## 2020-10-11 DIAGNOSIS — K859 Acute pancreatitis without necrosis or infection, unspecified: Secondary | ICD-10-CM

## 2020-10-11 DIAGNOSIS — M1990 Unspecified osteoarthritis, unspecified site: Secondary | ICD-10-CM

## 2020-10-11 DIAGNOSIS — T753XXA Motion sickness, initial encounter: Secondary | ICD-10-CM

## 2020-10-11 DIAGNOSIS — G4733 Obstructive sleep apnea (adult) (pediatric): Secondary | ICD-10-CM

## 2020-10-11 DIAGNOSIS — E7800 Pure hypercholesterolemia, unspecified: Secondary | ICD-10-CM

## 2020-10-11 DIAGNOSIS — R519 Generalized headaches: Secondary | ICD-10-CM

## 2020-10-11 DIAGNOSIS — N3010 Interstitial cystitis (chronic) without hematuria: Secondary | ICD-10-CM

## 2020-10-11 DIAGNOSIS — G43909 Migraine, unspecified, not intractable, without status migrainosus: Secondary | ICD-10-CM

## 2020-10-11 DIAGNOSIS — R0600 Dyspnea, unspecified: Secondary | ICD-10-CM

## 2020-10-11 DIAGNOSIS — G43009 Migraine without aura, not intractable, without status migrainosus: Secondary | ICD-10-CM

## 2020-10-11 DIAGNOSIS — Z720 Tobacco use: Secondary | ICD-10-CM

## 2020-10-11 MED ORDER — SUMATRIPTAN SUCCINATE 50 MG PO TAB
ORAL_TABLET | SUBCUTANEOUS | 3 refills | 30.00000 days | Status: AC
Start: 2020-10-11 — End: ?

## 2020-10-11 MED ORDER — DIVALPROEX 250 MG PO TBEC
750 mg | ORAL_TABLET | Freq: Every day | ORAL | 2 refills | 30.00000 days | Status: AC
Start: 2020-10-11 — End: ?

## 2020-10-14 ENCOUNTER — Encounter

## 2020-10-14 DIAGNOSIS — N3010 Interstitial cystitis (chronic) without hematuria: Secondary | ICD-10-CM

## 2020-10-14 DIAGNOSIS — T753XXA Motion sickness, initial encounter: Secondary | ICD-10-CM

## 2020-10-14 DIAGNOSIS — G4733 Obstructive sleep apnea (adult) (pediatric): Secondary | ICD-10-CM

## 2020-10-14 DIAGNOSIS — Z720 Tobacco use: Secondary | ICD-10-CM

## 2020-10-14 DIAGNOSIS — R0600 Dyspnea, unspecified: Secondary | ICD-10-CM

## 2020-10-14 DIAGNOSIS — I10 Essential (primary) hypertension: Secondary | ICD-10-CM

## 2020-10-14 DIAGNOSIS — J449 Chronic obstructive pulmonary disease, unspecified: Secondary | ICD-10-CM

## 2020-10-14 DIAGNOSIS — M1990 Unspecified osteoarthritis, unspecified site: Secondary | ICD-10-CM

## 2020-10-14 DIAGNOSIS — R42 Dizziness and giddiness: Secondary | ICD-10-CM

## 2020-10-14 DIAGNOSIS — K859 Acute pancreatitis without necrosis or infection, unspecified: Secondary | ICD-10-CM

## 2020-10-14 DIAGNOSIS — R519 Generalized headaches: Secondary | ICD-10-CM

## 2020-10-14 DIAGNOSIS — G43909 Migraine, unspecified, not intractable, without status migrainosus: Secondary | ICD-10-CM

## 2020-10-14 DIAGNOSIS — F99 Mental disorder, not otherwise specified: Secondary | ICD-10-CM

## 2020-10-14 DIAGNOSIS — J302 Other seasonal allergic rhinitis: Secondary | ICD-10-CM

## 2020-10-14 DIAGNOSIS — G44039 Episodic paroxysmal hemicrania, not intractable: Secondary | ICD-10-CM

## 2020-10-14 DIAGNOSIS — M549 Dorsalgia, unspecified: Secondary | ICD-10-CM

## 2020-10-14 DIAGNOSIS — E7800 Pure hypercholesterolemia, unspecified: Secondary | ICD-10-CM

## 2020-10-14 DIAGNOSIS — G5601 Carpal tunnel syndrome, right upper limb: Secondary | ICD-10-CM

## 2020-10-16 NOTE — Progress Notes
Date of Service: 10/17/2020    Subjective:          Christina Garrison is a 50 y.o. female emphysema, OSA on Bi-PAP, chronic headaches w/ occipital neuralgia, obesity s/p gastric sleeve 2015 (complicated by bilateral pleural effusions and extended hospitalization), GERD, arthritis, prior nicotine use now in remission, who presents to clinic for Bi-PAP compliance update.    Today, she reports that she has been doing fairly well using her Bi-PAP machine. In review of her compliance report, she has worn it for 26 of the last 30 days, though her AHI continues to be ~6 on therapy (previously down to 3). Per the report, she also has an air leak >20%, which is consistent with her feeling like her mask doesn't feel like it is fitting as well and she feels air blowing into her eyes. She has noticed that she has to adjust her mask frequently. She has done mask-fitting before and settled on the nasal mask; she did not tolerate nasal pillows or any mask that covered her mouth. She has also noticed that her morning headaches have returned. She is still having difficulty with sleep initiation, but remains motivated to improve her sleep hygiene      Since last visit, she stopped vaping nicotine; she is occasionally using the nicotine inhalers over the last week or so primarily due to worsening stress related to recent breast augmentation complicated by infection/need for woundvac.     Otherwise, she is not currently using any controller inhalers, and denies wheezing, cough, dyspnea.      Review of Systems   Constitutional: Positive for fatigue. Negative for activity change, appetite change, chills, diaphoresis and fever.   HENT: Negative for congestion, postnasal drip, rhinorrhea, sinus pressure and sinus pain.    Eyes: Negative.    Respiratory: Positive for apnea. Negative for cough, shortness of breath, wheezing and stridor.    Cardiovascular: Negative for chest pain, palpitations and leg swelling.   Gastrointestinal: Negative for abdominal distention, abdominal pain, blood in stool, constipation and diarrhea.   Endocrine: Negative.    Genitourinary: Negative for difficulty urinating, dysuria, frequency and hematuria.   Musculoskeletal: Negative for arthralgias, gait problem, joint swelling and myalgias.   Skin: Negative for color change and rash.   Allergic/Immunologic: Negative for environmental allergies and food allergies.   Neurological: Positive for headaches. Negative for dizziness, syncope and light-headedness.   Psychiatric/Behavioral: Negative for agitation, confusion, decreased concentration and dysphoric mood. The patient is not nervous/anxious.    All other systems reviewed and are negative.      Objective:         ? biotin 5,000 mcg TbDi Dissolve 2 tablets by mouth twice daily.   ? BYSTOLIC 5 mg tablet TAKE 1 TABLET BY MOUTH EVERY DAY   ? cholecalciferol (VITAMIN D-3) 1,000 units tablet Take 1,000 Units by mouth twice weekly.   ? clonazePAM (KLONOPIN) 0.5 mg tablet Take 1.5 tablets by mouth twice daily. (Patient taking differently: Take 0.75 mg by mouth daily.)   ? clonazePAM (KLONOPIN) 1 mg tablet Take one tablet by mouth as Needed (For driving). (Patient taking differently: Take 1 mg by mouth daily with dinner.)   ? cyanocobalamin (vitamin B-12) 2,500 mcg tab Take 1 tablet by mouth daily with breakfast.   ? diphenhydrAMINE (BENADRYL ALLERGY) 12.5 mg/5 mL oral solution Take 12.5 mg by mouth every 6 hours as needed.   ? divalproex (DEPAKOTE EC) 250 mg DR tablet Take three tablets by mouth daily. Take with  food.   ? doxycycline hyclate (VIBRACIN) 100 mg tablet Take one tablet by mouth twice daily.   ? famotidine (PEPCID) 20 mg tablet Take 20 mg by mouth daily as needed.   ? multivit, Ca, min-FA-soy isofl 400-60 mcg-mg tab Take 1 tablet by mouth twice daily.   ? mupirocin (CENTANY) 2 % topical ointment Apply  topically to affected area twice daily.   ? nystatin (NYSTOP) 100,000 unit/g topical powder Apply one g topically to affected area four times daily.   ? nystatin-triamcinolone 100,000 unit/g / 0.1 % topical cream Apply  topically to affected area twice daily as needed. Apply sparingly to area twice a day for 5 days followed by once a day for 5 days.   ? ondansetron (ZOFRAN ODT) 4 mg rapid dissolve tablet Dissolve one tablet by mouth every 8 hours as needed. Place on tongue to disolve.   ? pantoprazole DR (PROTONIX) 40 mg tablet TAKE 1 TABLET BY MOUTH EVERY DAY (Patient taking differently: Take 40 mg by mouth as Needed.)   ? Potassium 99 mg tab Take 1 tablet by mouth three times weekly.   ? prochlorperazine maleate (COMPAZINE) 10 mg tablet Take one tablet by mouth every 6 hours as needed for Nausea or Vomiting.   ? rosuvastatin (CRESTOR) 20 mg tablet TAKE 1 TABLET BY MOUTH EVERY DAY   ? scopolamine (TRANSDERM-SCOP) 1.5 mg 3 day patch Apply one patch to top of skin as directed every 72 hours. (Patient taking differently: Apply 1 patch to top of skin as directed as Needed.)   ? sertraline (ZOLOFT) 100 mg tablet Take one-half tablet by mouth daily. Take with 25 mg for total 75 mg dose.   ? sertraline (ZOLOFT) 25 mg tablet Take one tablet by mouth daily. For a total of 75mg  daily.  Indications: anxiousness associated with depression   ? SUMAtriptan succinate (IMITREX) 50 mg tablet Take one tablet by mouth at onset of headache. May repeat after 2 hours if needed. Max of 200 mg in 24 hours.   ? verapamil (CALAN) 40 mg tablet TAKE 1/2 TABLET BY MOUTH IN THE MORNING AND TAKE 1 TABLET IN THE EVENING   ? vitamins, multi w/minerals 9 mg iron-400 mcg tab Take 1 tablet by mouth daily with breakfast.       Vitals:    10/17/20 1414   BP: 119/69   BP Source: Arm, Right Upper   Patient Position: Sitting   Pulse: 79   Resp: 18   SpO2: 98%   Weight: 96.2 kg (212 lb)   Height: 165.1 cm (5' 5)   PainSc: Zero     Body mass index is 35.28 kg/m?Marland Kitchen     Physical Exam  Vitals and nursing note reviewed.   Constitutional:       General: She is not in acute distress.     Appearance: Normal appearance. She is obese. She is not ill-appearing or toxic-appearing.   HENT:      Head: Normocephalic and atraumatic.      Right Ear: External ear normal.      Left Ear: External ear normal.      Nose: Nose normal.      Mouth/Throat:      Mouth: Mucous membranes are moist.      Pharynx: Oropharynx is clear.   Eyes:      Extraocular Movements: Extraocular movements intact.      Conjunctiva/sclera: Conjunctivae normal.      Pupils: Pupils are equal, round, and reactive to  light.   Cardiovascular:      Rate and Rhythm: Normal rate and regular rhythm.      Pulses: Normal pulses.      Heart sounds: Normal heart sounds. No murmur heard.    No friction rub. No gallop.   Pulmonary:      Effort: Pulmonary effort is normal. No respiratory distress.      Breath sounds: Normal breath sounds. No stridor. No wheezing, rhonchi or rales.   Abdominal:      General: Bowel sounds are normal. There is no distension.      Palpations: Abdomen is soft.      Tenderness: There is no abdominal tenderness.   Musculoskeletal:         General: Normal range of motion.      Cervical back: Normal range of motion. No rigidity. No muscular tenderness.      Right lower leg: No edema.      Left lower leg: No edema.   Lymphadenopathy:      Cervical: No cervical adenopathy.   Skin:     General: Skin is warm and dry.      Capillary Refill: Capillary refill takes less than 2 seconds.      Findings: No rash.   Neurological:      General: No focal deficit present.      Mental Status: She is alert and oriented to person, place, and time. Mental status is at baseline.      Cranial Nerves: No cranial nerve deficit.   Psychiatric:         Mood and Affect: Mood normal.         Behavior: Behavior normal.         Labs:  Abs eos 170 (06/09/19)  CO2 28 (06/09/19)    Imaging:  CT chest 10/07/18:  1. ?No mediastinal mass, lymphadenopathy or fluid collection. Changes of   gastric sleeve are noted.     2. ?Mild emphysema.     3. ?There are 2 tiny nodules along the left major fissure which likely   represent granulomas or intrapulmonary lymph nodes. If there are   significant risk factors for pulmonary malignancy, a follow-up chest CT in   12 months could be obtained to assess for stability.     PFTs:   07/11/19  FVC 3.19L/92%  SVC 3.36L/97%  FEV1 2.63L/95%  Ratio 78%/ LLN 70%  RV 97%  TLC 98%  DLCO 94%  Normal pulmonary function tests    Assessment & Plan:  Christina Garrison is a 50 y.o. female with radiographic emphysema, OSA on Bi-PAP, chronic headaches w/ occipital neuralgia, obesity s/p gastric sleeve 2015 (complicated by bilateral pleural effusions and extended hospitalization), GERD, arthritis, former nicotine use now in remission, who presents to clinic for Bi-PAP compliance update.    1. Obstructive sleep apnea syndrome  Currently using her Bi-PAP> 4 hours per night for 87% of the nights in the past month.  She has noticed worsening of her morning headaches, and continues to have issues with mask fitting as well as poor sleep hygiene.  AHI has also worsened to 6.5/hr (previously 3.5/hr). For this reason we will contact [facility] for mask re-fitting. Following this, will reassess compliance report and if leak persists/AHI remains >5, will order repeat titration study.       2. Centrilobular emphysema (HCC)  Mild emphysema on prior imaging, no obstruction on PFTs.  Not currently on any inhalers and no significant dyspnea.    Follow-up  in 1 year.     Patient seen and evaluated with Dr. Maisie Fus.    Anola Gurney, MD

## 2020-10-17 ENCOUNTER — Encounter

## 2020-10-17 DIAGNOSIS — I10 Essential (primary) hypertension: Secondary | ICD-10-CM

## 2020-10-17 DIAGNOSIS — Z720 Tobacco use: Secondary | ICD-10-CM

## 2020-10-17 DIAGNOSIS — G4733 Obstructive sleep apnea (adult) (pediatric): Secondary | ICD-10-CM

## 2020-10-17 DIAGNOSIS — R519 Generalized headaches: Secondary | ICD-10-CM

## 2020-10-17 DIAGNOSIS — J302 Other seasonal allergic rhinitis: Secondary | ICD-10-CM

## 2020-10-17 DIAGNOSIS — M1990 Unspecified osteoarthritis, unspecified site: Secondary | ICD-10-CM

## 2020-10-17 DIAGNOSIS — T753XXA Motion sickness, initial encounter: Secondary | ICD-10-CM

## 2020-10-17 DIAGNOSIS — M549 Dorsalgia, unspecified: Secondary | ICD-10-CM

## 2020-10-17 DIAGNOSIS — R0600 Dyspnea, unspecified: Secondary | ICD-10-CM

## 2020-10-17 DIAGNOSIS — N3010 Interstitial cystitis (chronic) without hematuria: Secondary | ICD-10-CM

## 2020-10-17 DIAGNOSIS — F99 Mental disorder, not otherwise specified: Secondary | ICD-10-CM

## 2020-10-17 DIAGNOSIS — R42 Dizziness and giddiness: Secondary | ICD-10-CM

## 2020-10-17 DIAGNOSIS — G43909 Migraine, unspecified, not intractable, without status migrainosus: Secondary | ICD-10-CM

## 2020-10-17 DIAGNOSIS — K859 Acute pancreatitis without necrosis or infection, unspecified: Secondary | ICD-10-CM

## 2020-10-17 DIAGNOSIS — E7800 Pure hypercholesterolemia, unspecified: Secondary | ICD-10-CM

## 2020-10-17 DIAGNOSIS — G5601 Carpal tunnel syndrome, right upper limb: Secondary | ICD-10-CM

## 2020-10-17 DIAGNOSIS — J449 Chronic obstructive pulmonary disease, unspecified: Secondary | ICD-10-CM

## 2020-10-17 DIAGNOSIS — G44039 Episodic paroxysmal hemicrania, not intractable: Secondary | ICD-10-CM

## 2020-10-17 NOTE — Patient Instructions
Thank you for coming in! We will be in contact with [facility] to assist with mask re-fitting and they can assess your humidifier as well. A better seal/head gear should help your morning headaches.     My nurse is Estanislado Spire, Therapist, sports.  She can be reached at [phone].    Please contact my nurse with any signs and symptoms of worsening productive cough with thick secretions, blood in sputum, chest tightness/pain, shortness of breath, fever, chills, night sweats, or any questions or concerns.    For refills on medications, please have your pharmacy fax a refill authorization request form to our office at [phone]. Please allow at least 3 business days for refill requests.    For urgent issues after business hours/weekends/holidays call [phone] and request for the pulmonary fellow to be paged.

## 2020-10-17 NOTE — Telephone Encounter
Order for mask refit and to check humidifier placed as discussed per office note on 10/17/20    DME: [facility]  Routing to Princella Ion, MA CCC-SLP to fax order and continue follow up on order status.

## 2020-10-18 ENCOUNTER — Encounter

## 2020-10-18 DIAGNOSIS — N3010 Interstitial cystitis (chronic) without hematuria: Secondary | ICD-10-CM

## 2020-10-18 DIAGNOSIS — E7800 Pure hypercholesterolemia, unspecified: Secondary | ICD-10-CM

## 2020-10-18 DIAGNOSIS — R42 Dizziness and giddiness: Secondary | ICD-10-CM

## 2020-10-18 DIAGNOSIS — T753XXA Motion sickness, initial encounter: Secondary | ICD-10-CM

## 2020-10-18 DIAGNOSIS — R0600 Dyspnea, unspecified: Secondary | ICD-10-CM

## 2020-10-18 DIAGNOSIS — M549 Dorsalgia, unspecified: Secondary | ICD-10-CM

## 2020-10-18 DIAGNOSIS — K859 Acute pancreatitis without necrosis or infection, unspecified: Secondary | ICD-10-CM

## 2020-10-18 DIAGNOSIS — F99 Mental disorder, not otherwise specified: Secondary | ICD-10-CM

## 2020-10-18 DIAGNOSIS — J302 Other seasonal allergic rhinitis: Secondary | ICD-10-CM

## 2020-10-18 DIAGNOSIS — G4733 Obstructive sleep apnea (adult) (pediatric): Secondary | ICD-10-CM

## 2020-10-18 DIAGNOSIS — G44039 Episodic paroxysmal hemicrania, not intractable: Secondary | ICD-10-CM

## 2020-10-18 DIAGNOSIS — I10 Essential (primary) hypertension: Secondary | ICD-10-CM

## 2020-10-18 DIAGNOSIS — J449 Chronic obstructive pulmonary disease, unspecified: Secondary | ICD-10-CM

## 2020-10-18 DIAGNOSIS — G5601 Carpal tunnel syndrome, right upper limb: Secondary | ICD-10-CM

## 2020-10-18 DIAGNOSIS — G43909 Migraine, unspecified, not intractable, without status migrainosus: Secondary | ICD-10-CM

## 2020-10-18 DIAGNOSIS — Z720 Tobacco use: Secondary | ICD-10-CM

## 2020-10-18 DIAGNOSIS — R519 Generalized headaches: Secondary | ICD-10-CM

## 2020-10-18 DIAGNOSIS — M1990 Unspecified osteoarthritis, unspecified site: Secondary | ICD-10-CM

## 2020-10-19 MED ORDER — LEVOFLOXACIN 500 MG PO TAB
500 mg | ORAL_TABLET | Freq: Every day | ORAL | 0 refills | 7.00000 days | Status: AC
Start: 2020-10-19 — End: ?

## 2020-10-20 NOTE — Telephone Encounter
After hours when I listened to call. Forwarded call and information to Dr. Keenan Bachelor.

## 2020-10-20 NOTE — Telephone Encounter
I got a voicemail from the patient's home health nurse.  The nurse reports that the right side has opened up and the left side wound has gotten larger.  She is having more drainage.  There has also been some blue-green drainage.     The home health nurse suggested an antibiotic.  I called the patient today and discussed the plan with her.    It is discouraging and puzzling that she is not healing.  She says it will be 3 months since the surgery on Tuesday.  She did use a nicotine pen one time since her last visit.  However she has thrown it away.  She is not regularly using nicotine.    I ordered her Levaquin for 2 weeks.  Told her to pick it up at the pharmacy and start it tonight.  If the Levaquin does not help, we will order a CT chest with contrast to rule out a fluid collection.  I will also [facility] to see if they have any suggestions.  She may benefit from hyperbaric oxygen therapy, but needs to be approved for it.     She is also having problems with itching of her hands and feet.  Her feet are itching and she is using hydrocortisone cream multiple times per day.  They become very swollen.  In the morning when she wakes up her feet are painful.  She says the itching has now spread to her buttocks but nowhere else.    She asked her pulmonologist for a suggestion but they had none.  I considered writing for Atarax, however I do not want to complicate the picture with more medications.  She has tried the antifungal powder for her feet, but she said it did not help.    She is hoping that the antibiotics will help with the itching.    Also the nurse home health nurse and the patient were brainstorming that possibly the patient has an allergy to the silver.  However we did not use the silver throughout the whole course of her healing.    I also explained to the patient that I have considered cutting out the incisions and reclosing them.  However she would still need to heal those very new incisions.We discussed that I would do this in the office, but she was concerned that she won't be under anesthesia.     She sees our nurse practitioner this Wednesday.  I will forward this note to our nurse practitioner so she knows the plan.    The patient was appreciative of the phone call.

## 2020-10-23 ENCOUNTER — Encounter: Admit: 2020-10-23 | Discharge: 2020-10-23 | Payer: PRIVATE HEALTH INSURANCE

## 2020-10-24 ENCOUNTER — Encounter: Admit: 2020-10-24 | Discharge: 2020-10-24 | Payer: PRIVATE HEALTH INSURANCE

## 2020-10-24 ENCOUNTER — Ambulatory Visit: Admit: 2020-10-24 | Discharge: 2020-10-25 | Payer: Commercial Managed Care - HMO

## 2020-10-24 DIAGNOSIS — N62 Hypertrophy of breast: Principal | ICD-10-CM

## 2020-10-24 DIAGNOSIS — K859 Acute pancreatitis without necrosis or infection, unspecified: Secondary | ICD-10-CM

## 2020-10-24 DIAGNOSIS — J449 Chronic obstructive pulmonary disease, unspecified: Secondary | ICD-10-CM

## 2020-10-24 DIAGNOSIS — M1990 Unspecified osteoarthritis, unspecified site: Secondary | ICD-10-CM

## 2020-10-24 DIAGNOSIS — R0600 Dyspnea, unspecified: Secondary | ICD-10-CM

## 2020-10-24 DIAGNOSIS — E65 Localized adiposity: Secondary | ICD-10-CM

## 2020-10-24 DIAGNOSIS — G44039 Episodic paroxysmal hemicrania, not intractable: Secondary | ICD-10-CM

## 2020-10-24 DIAGNOSIS — K4090 Unilateral inguinal hernia, without obstruction or gangrene, not specified as recurrent: Secondary | ICD-10-CM

## 2020-10-24 DIAGNOSIS — F99 Mental disorder, not otherwise specified: Secondary | ICD-10-CM

## 2020-10-24 DIAGNOSIS — N3010 Interstitial cystitis (chronic) without hematuria: Secondary | ICD-10-CM

## 2020-10-24 DIAGNOSIS — T753XXA Motion sickness, initial encounter: Secondary | ICD-10-CM

## 2020-10-24 DIAGNOSIS — J302 Other seasonal allergic rhinitis: Secondary | ICD-10-CM

## 2020-10-24 DIAGNOSIS — M549 Dorsalgia, unspecified: Secondary | ICD-10-CM

## 2020-10-24 DIAGNOSIS — T148XXD Other injury of unspecified body region, subsequent encounter: Secondary | ICD-10-CM

## 2020-10-24 DIAGNOSIS — G5601 Carpal tunnel syndrome, right upper limb: Secondary | ICD-10-CM

## 2020-10-24 DIAGNOSIS — R42 Dizziness and giddiness: Secondary | ICD-10-CM

## 2020-10-24 DIAGNOSIS — Z7689 Persons encountering health services in other specified circumstances: Secondary | ICD-10-CM

## 2020-10-24 DIAGNOSIS — G4733 Obstructive sleep apnea (adult) (pediatric): Secondary | ICD-10-CM

## 2020-10-24 DIAGNOSIS — I10 Essential (primary) hypertension: Secondary | ICD-10-CM

## 2020-10-24 DIAGNOSIS — E7800 Pure hypercholesterolemia, unspecified: Secondary | ICD-10-CM

## 2020-10-24 DIAGNOSIS — G43909 Migraine, unspecified, not intractable, without status migrainosus: Secondary | ICD-10-CM

## 2020-10-24 DIAGNOSIS — R519 Generalized headaches: Secondary | ICD-10-CM

## 2020-10-24 DIAGNOSIS — Z720 Tobacco use: Secondary | ICD-10-CM

## 2020-10-26 ENCOUNTER — Encounter: Admit: 2020-10-26 | Discharge: 2020-10-26 | Payer: PRIVATE HEALTH INSURANCE

## 2020-10-26 MED ORDER — METHYLPREDNISOLONE 4 MG PO DSPK
ORAL_TABLET | 0 refills | Status: AC
Start: 2020-10-26 — End: ?

## 2020-10-26 NOTE — Telephone Encounter
Donnetta Simpers [facility] nurse called back [phone]  Reports pt c/o increased itching/swelling to hands/feet. Reports wounds "looking better, granulating" MD notified ok to begin Medrol dose pack (pt has tolerated in the past) as directed and Sorbact with wound vac application. To discontinue Levaquin. Follow up as scheduled, sooner if any concerns. Tilda Burrow, RN

## 2020-10-31 ENCOUNTER — Encounter: Admit: 2020-10-31 | Discharge: 2020-10-31 | Payer: PRIVATE HEALTH INSURANCE

## 2020-10-31 NOTE — Telephone Encounter
Called to check on patient.  She says the right side is better, overall. Not completely healed.  The left side is more swollen and red. She has pain from the left. The left still has a wound with a little green drainage now.     She still has swelling of her hands and feet and itching.   Has been on a medrol dose pack. Has one more pill remaining. The rash on her hands/feet/buttocks is now gone. There is still swelling to her hands and feet.     Is back on the wound VAC again to the left side. Still has 2 areas on the left and one on the right that are covered by the wound vac.     Is getting a CT chest to look at the breasts and a CT abdomen to check for a hernia.     She is also waiting to get an appointment with a PCP.

## 2020-10-31 NOTE — Telephone Encounter
-----   Message from Gypsy Balsam, RN sent at 10/23/2020 10:59 AM CST -----  Regarding: FW: Silver...   Dr. Keenan Bachelor,    Please see the photo of silver products she has used. Home Health said she reacted to the [facility].    Gypsy Balsam, RN  ----- Message -----  From: Nicholos Johns  Sent: 10/23/2020  10:00 AM CST  To: [facility]  Subject: Silver...                                        Hello Dr. Leverne Humbles or Shirlean Mylar,    I received the notes from our discussion friday evening Oct 19, 2020 and I noticed in your notes that you said I did use any silver products during my healing process.   I think someone needs to look over my file again because I have had several silver products during the healing process and all I purchased online after given samples and instructions on what to get.   I am sending you a picture of these products.  I just want this on record for in my file please.     Thank you  Christina Garrison   1970-11-10

## 2020-11-01 ENCOUNTER — Encounter: Admit: 2020-11-01 | Discharge: 2020-11-01 | Payer: PRIVATE HEALTH INSURANCE

## 2020-11-01 IMAGING — CT L_SPINE_(Adult)
3 of 5 series · 10 of 33 positions shown, 12 images · non-contrast
Comparison: none

[Series 2: l-spine ax 2.00 br60 s3 · axial · 0.32mm/px · z∈[+1375,+1483]mm · 2 of 159 slices shown, 3 images]
[im 53/159  soft-tissue]
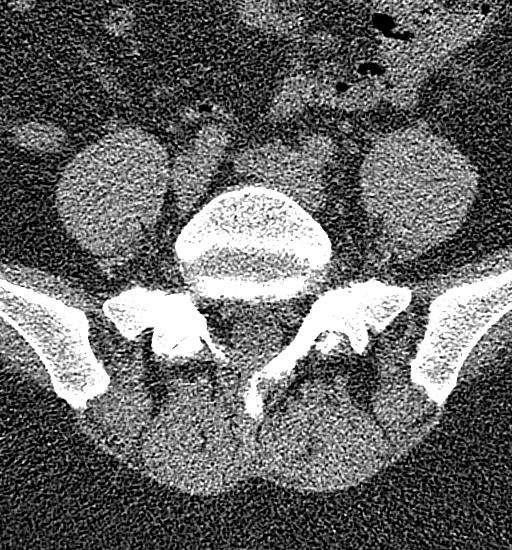
[im 53/159  bone]
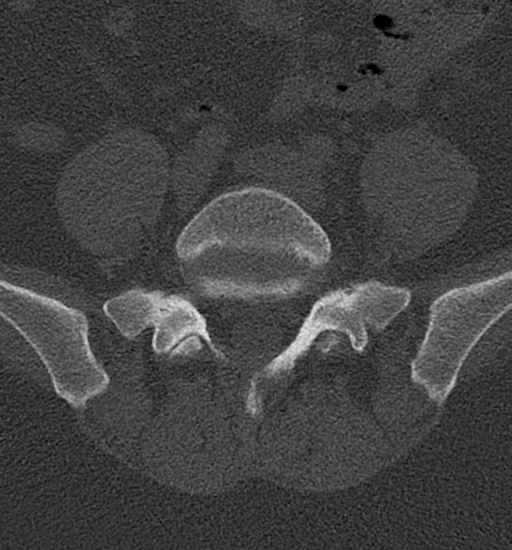
[im 106/159  bone]
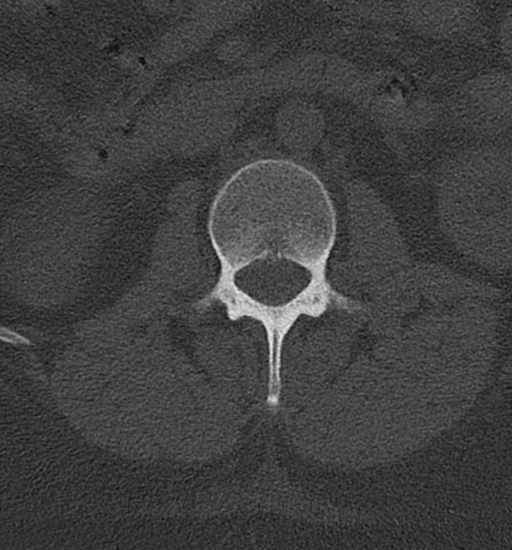

[Series 4: l-spine cor 2.00 br60 s3 · coronal · 0.32mm/px · 3 of 86 slices shown]
[im 18/86  bone]
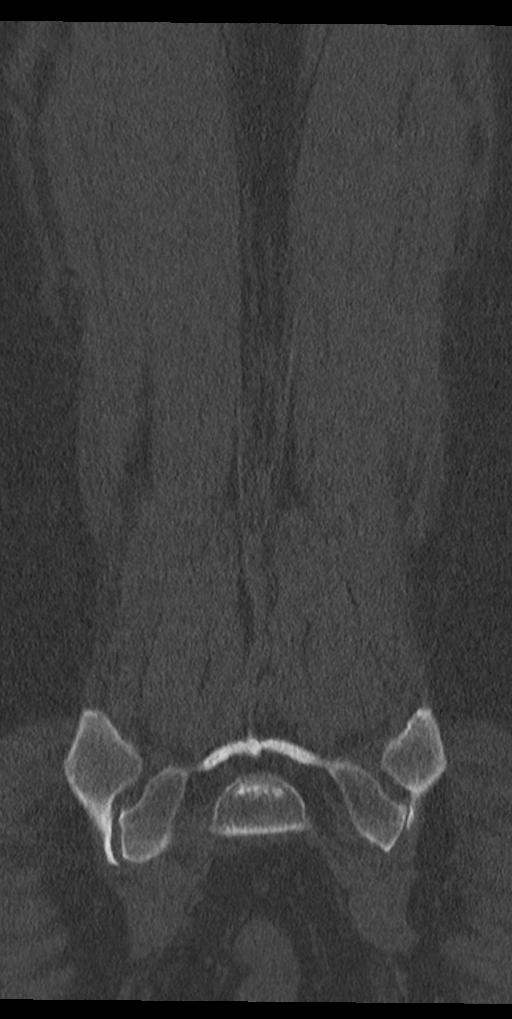
[im 35/86  bone]
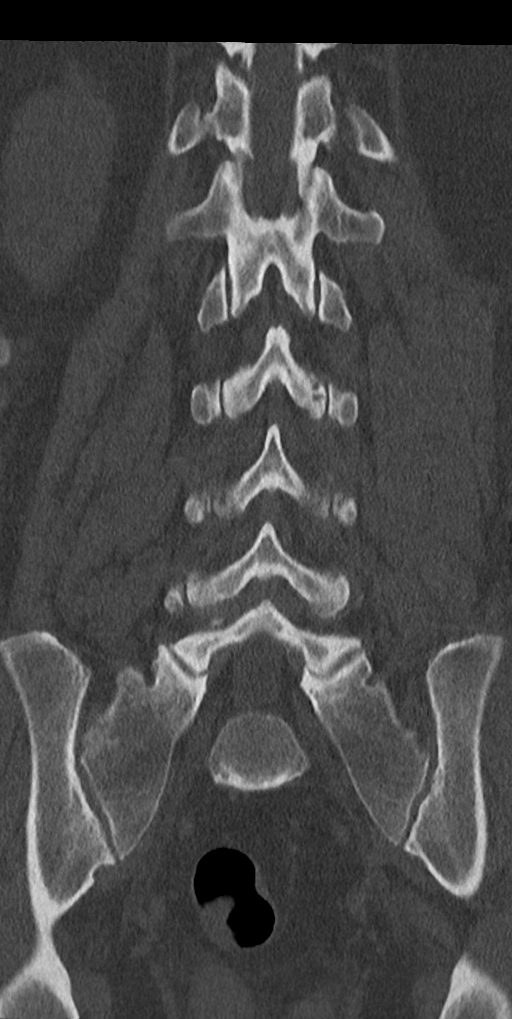
[im 52/86  bone]
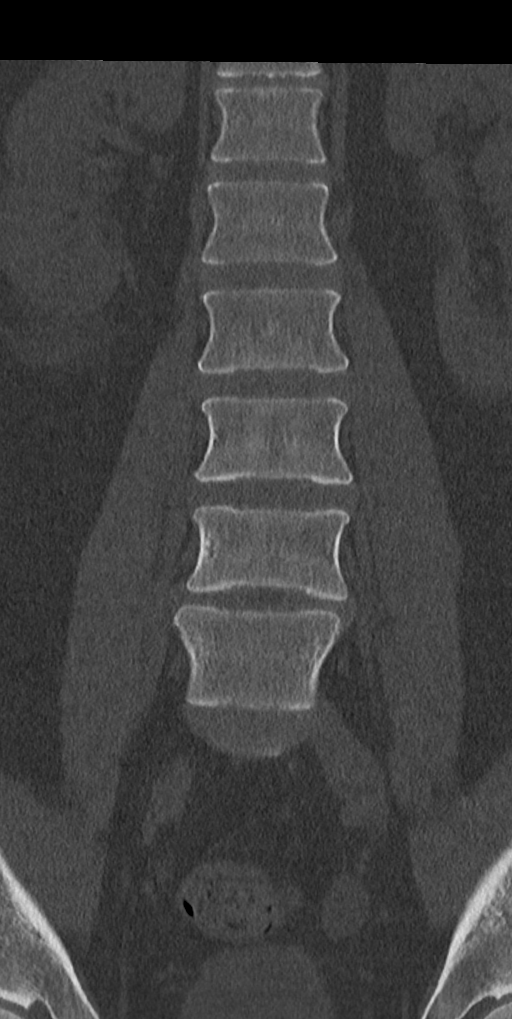

[Series 6: l-spine sag 2.00 br60 s3 · sagittal · 0.34mm/px · 5 of 79 slices shown, 6 images]
[im 27/79  bone]
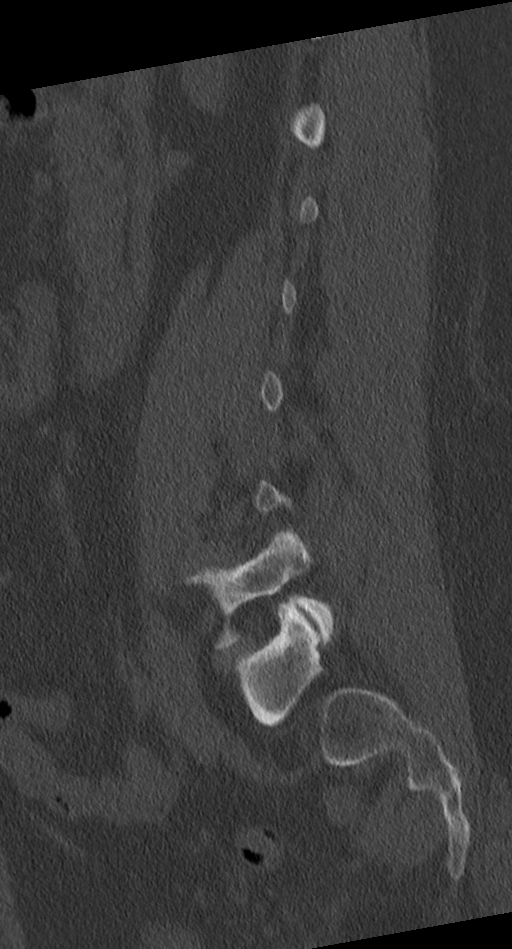
[im 33/79  bone]
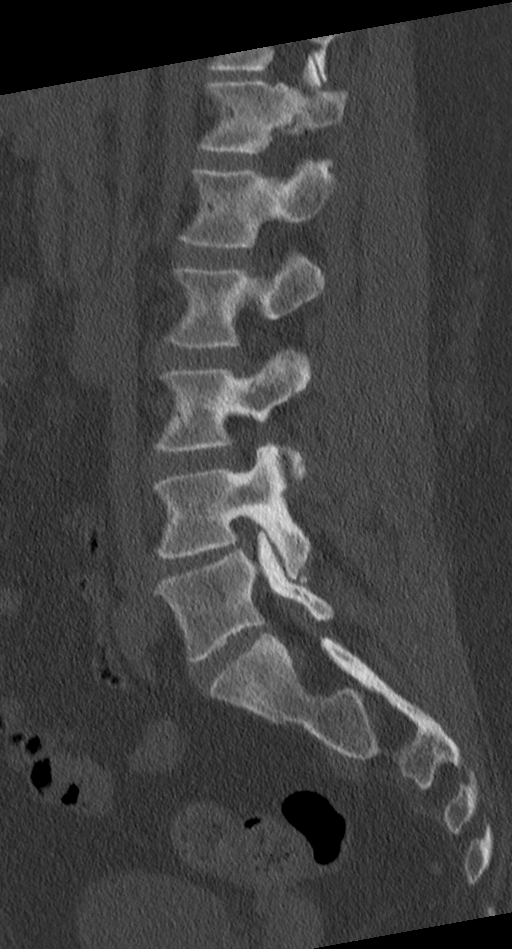
[im 40/79  soft-tissue]
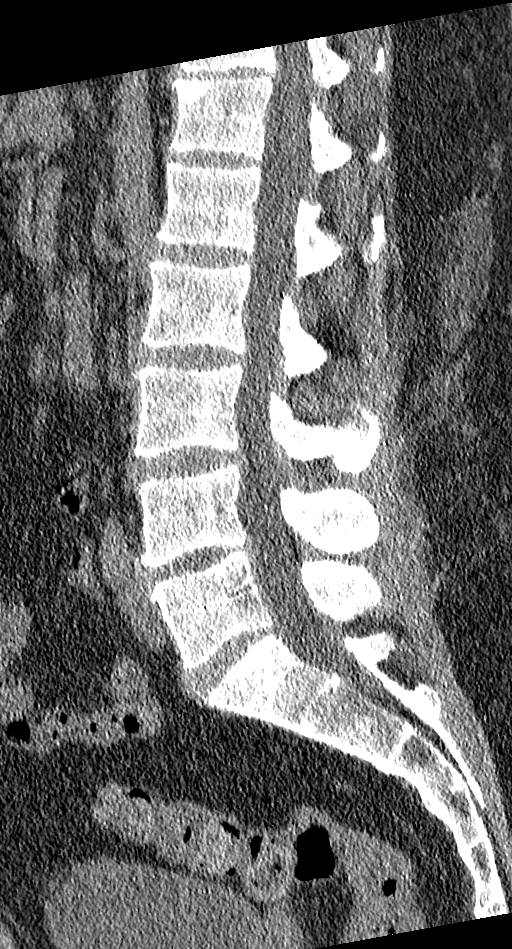
[im 40/79  bone]
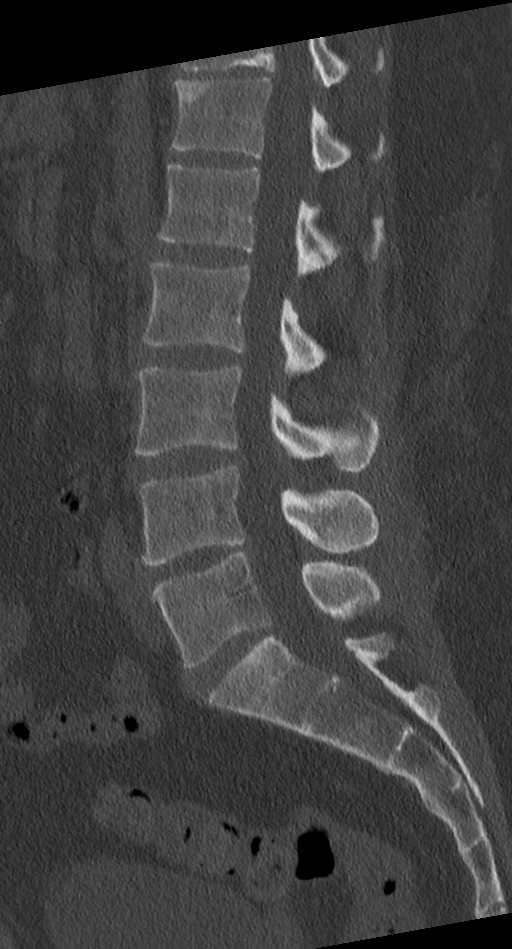
[im 46/79  bone]
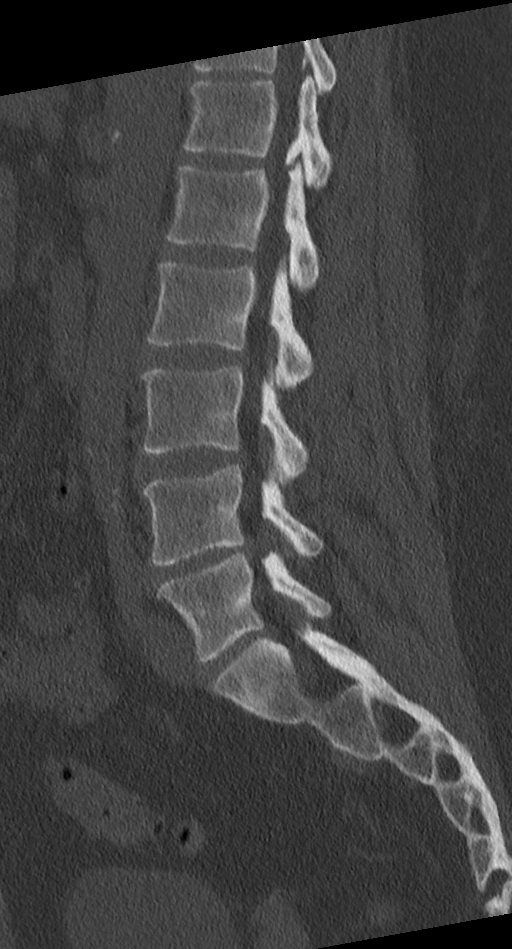
[im 53/79  bone]
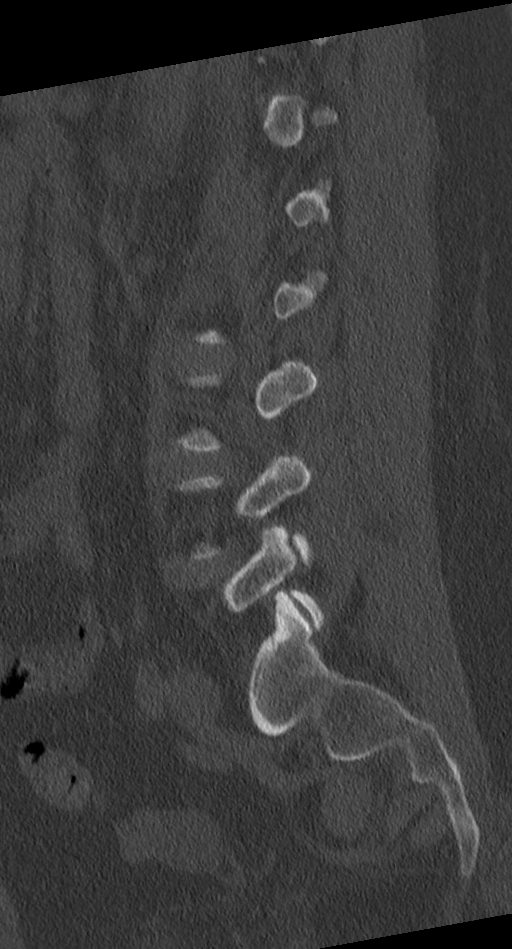

[10 of 33 positions shown; findings below may reference images not displayed]

All CT scans at this facility use dose modulation, iterative reconstruction, and/or weight based
dosing when appropriate to reduce radiation dose to as low as reasonably achievable.

EXAM
CT lumbar spine

INDICATION
Chronic back pain, worsening LE numbness and weakn
pt. c/o chronic lower back pain. recent worsening leg numbness and weakness.

TECHNIQUE
Routine CT lumbar spine without contrast.

COMPARISONS
None

FINDINGS
Normal vertebral body height and alignment. No acute fracture. , greatest at L4-L5 and L5-S1. Mild
multilevel degenerative disc disease Minimal multilevel facet arthropathy. Paraspinal soft tissues
are unremarkable.

IMPRESSION
No acute osseous abnormality.
Mild multilevel lumbar spondylosis.

Tech Notes:

pt. c/o chronic lower back pain. recent worsening leg numbness and weakness.

## 2020-11-02 ENCOUNTER — Encounter: Admit: 2020-11-02 | Discharge: 2020-11-02 | Payer: Commercial Managed Care - HMO

## 2020-11-02 ENCOUNTER — Encounter: Admit: 2020-11-02 | Discharge: 2020-11-02 | Payer: PRIVATE HEALTH INSURANCE

## 2020-11-02 DIAGNOSIS — K4090 Unilateral inguinal hernia, without obstruction or gangrene, not specified as recurrent: Secondary | ICD-10-CM

## 2020-11-02 DIAGNOSIS — T148XXD Other injury of unspecified body region, subsequent encounter: Secondary | ICD-10-CM

## 2020-11-02 MED ORDER — IOHEXOL 350 MG IODINE/ML IV SOLN
100 mL | Freq: Once | INTRAVENOUS | 0 refills | Status: CP
Start: 2020-11-02 — End: ?
  Administered 2020-11-02: 21:00:00 100 mL via INTRAVENOUS

## 2020-11-02 MED ORDER — SODIUM CHLORIDE 0.9 % IJ SOLN
50 mL | Freq: Once | INTRAVENOUS | 0 refills | Status: CP
Start: 2020-11-02 — End: ?
  Administered 2020-11-02: 21:00:00 50 mL via INTRAVENOUS

## 2020-11-07 ENCOUNTER — Encounter: Admit: 2020-11-07 | Discharge: 2020-11-07 | Payer: PRIVATE HEALTH INSURANCE

## 2020-11-07 ENCOUNTER — Ambulatory Visit: Admit: 2020-11-07 | Discharge: 2020-11-08 | Payer: Commercial Managed Care - HMO

## 2020-11-07 DIAGNOSIS — M1990 Unspecified osteoarthritis, unspecified site: Secondary | ICD-10-CM

## 2020-11-07 DIAGNOSIS — G43909 Migraine, unspecified, not intractable, without status migrainosus: Secondary | ICD-10-CM

## 2020-11-07 DIAGNOSIS — J302 Other seasonal allergic rhinitis: Secondary | ICD-10-CM

## 2020-11-07 DIAGNOSIS — G44039 Episodic paroxysmal hemicrania, not intractable: Secondary | ICD-10-CM

## 2020-11-07 DIAGNOSIS — R519 Generalized headaches: Secondary | ICD-10-CM

## 2020-11-07 DIAGNOSIS — I10 Essential (primary) hypertension: Secondary | ICD-10-CM

## 2020-11-07 DIAGNOSIS — T148XXD Other injury of unspecified body region, subsequent encounter: Secondary | ICD-10-CM

## 2020-11-07 DIAGNOSIS — F99 Mental disorder, not otherwise specified: Secondary | ICD-10-CM

## 2020-11-07 DIAGNOSIS — R0600 Dyspnea, unspecified: Secondary | ICD-10-CM

## 2020-11-07 DIAGNOSIS — T8189XA Other complications of procedures, not elsewhere classified, initial encounter: Secondary | ICD-10-CM

## 2020-11-07 DIAGNOSIS — K859 Acute pancreatitis without necrosis or infection, unspecified: Secondary | ICD-10-CM

## 2020-11-07 DIAGNOSIS — M549 Dorsalgia, unspecified: Secondary | ICD-10-CM

## 2020-11-07 DIAGNOSIS — E7800 Pure hypercholesterolemia, unspecified: Secondary | ICD-10-CM

## 2020-11-07 DIAGNOSIS — G5601 Carpal tunnel syndrome, right upper limb: Secondary | ICD-10-CM

## 2020-11-07 DIAGNOSIS — N62 Hypertrophy of breast: Secondary | ICD-10-CM

## 2020-11-07 DIAGNOSIS — G4733 Obstructive sleep apnea (adult) (pediatric): Secondary | ICD-10-CM

## 2020-11-07 DIAGNOSIS — N3010 Interstitial cystitis (chronic) without hematuria: Secondary | ICD-10-CM

## 2020-11-07 DIAGNOSIS — T753XXA Motion sickness, initial encounter: Secondary | ICD-10-CM

## 2020-11-07 DIAGNOSIS — Z720 Tobacco use: Secondary | ICD-10-CM

## 2020-11-07 DIAGNOSIS — R42 Dizziness and giddiness: Secondary | ICD-10-CM

## 2020-11-07 DIAGNOSIS — J449 Chronic obstructive pulmonary disease, unspecified: Secondary | ICD-10-CM

## 2020-11-07 NOTE — Progress Notes
Subjective:    History of Present Illness  Christina Garrison is a 50 y.o. female who is s/p BBR. Patient continues to be followed in wound care clinic. She indicates that the L breast is still draining and has had significant drainage over the last 2 days. She is unsure if the R side is draining. Both locations were packed with OASIS and bandaged in the wound care clinic today. She is set to see wound care again next Wednesday for another evaluation followed by packing. She is no longer requiring the wound VAC.    Expressed to me concern that we and the wound clinic did not clean the wound prior to dressing change. But her home health nurse did clean the wound.     ?  Review of Systems   Constitutional: Negative.    HENT: Positive for rhinorrhea and sinus pressure.    Eyes: Positive for photophobia.   Respiratory: Negative.    Cardiovascular: Negative.    Gastrointestinal: Negative.    Endocrine: Negative.    Genitourinary: Positive for pelvic pain.   Musculoskeletal: Negative.    Skin: Negative.    Allergic/Immunologic: Negative.    Neurological: Positive for headaches.   Psychiatric/Behavioral: Positive for agitation. The patient is nervous/anxious.    ?  ?  ?  Objective:         ? biotin 5,000 mcg TbDi Dissolve 2 tablets by mouth twice daily.   ? BYSTOLIC 5 mg tablet TAKE 1 TABLET BY MOUTH EVERY DAY   ? cholecalciferol (VITAMIN D-3) 1,000 units tablet Take 1,000 Units by mouth twice weekly.   ? clonazePAM (KLONOPIN) 0.5 mg tablet Take 1.5 tablets by mouth twice daily. (Patient taking differently: Take 0.75 mg by mouth daily.)   ? clonazePAM (KLONOPIN) 1 mg tablet Take one tablet by mouth as Needed (For driving). (Patient taking differently: Take 1 mg by mouth daily with dinner.)   ? cyanocobalamin (vitamin B-12) 2,500 mcg tab Take 1 tablet by mouth daily with breakfast.   ? diphenhydrAMINE (BENADRYL ALLERGY) 12.5 mg/5 mL oral solution Take 12.5 mg by mouth every 6 hours as needed.   ? divalproex (DEPAKOTE EC) 250 mg DR tablet Take three tablets by mouth daily. Take with food.   ? famotidine (PEPCID) 20 mg tablet Take 20 mg by mouth daily as needed.   ? multivit, Ca, min-FA-soy isofl 400-60 mcg-mg tab Take 1 tablet by mouth twice daily.   ? mupirocin (CENTANY) 2 % topical ointment Apply  topically to affected area twice daily.   ? nystatin (NYSTOP) 100,000 unit/g topical powder Apply one g topically to affected area four times daily.   ? nystatin-triamcinolone 100,000 unit/g / 0.1 % topical cream Apply  topically to affected area twice daily as needed. Apply sparingly to area twice a day for 5 days followed by once a day for 5 days.   ? ondansetron (ZOFRAN ODT) 4 mg rapid dissolve tablet Dissolve one tablet by mouth every 8 hours as needed. Place on tongue to disolve.   ? pantoprazole DR (PROTONIX) 40 mg tablet TAKE 1 TABLET BY MOUTH EVERY DAY (Patient taking differently: Take 40 mg by mouth as Needed.)   ? Potassium 99 mg tab Take 1 tablet by mouth three times weekly.   ? prochlorperazine maleate (COMPAZINE) 10 mg tablet Take one tablet by mouth every 6 hours as needed for Nausea or Vomiting.   ? rosuvastatin (CRESTOR) 20 mg tablet TAKE 1 TABLET BY MOUTH EVERY DAY   ?  scopolamine (TRANSDERM-SCOP) 1.5 mg 3 day patch Apply one patch to top of skin as directed every 72 hours. (Patient taking differently: Apply 1 patch to top of skin as directed as Needed.)   ? sertraline (ZOLOFT) 100 mg tablet Take one-half tablet by mouth daily. Take with 25 mg for total 75 mg dose.   ? sertraline (ZOLOFT) 25 mg tablet Take one tablet by mouth daily. For a total of 75mg  daily.  Indications: anxiousness associated with depression   ? SUMAtriptan succinate (IMITREX) 50 mg tablet Take one tablet by mouth at onset of headache. May repeat after 2 hours if needed. Max of 200 mg in 24 hours.   ? verapamil (CALAN) 40 mg tablet TAKE 1/2 TABLET BY MOUTH IN THE MORNING AND TAKE 1 TABLET IN THE EVENING   ? vitamins, multi w/minerals 9 mg iron-400 mcg tab Take 1 tablet by mouth daily with breakfast.   ?       Vitals:   ? 11/07/20 1457 11/07/20 1458   PainSc: Eight Five   ?  Body mass index is 37.74 kg/m?Marland Kitchen  Vitals:    11/07/20 1458   BP: 120/78   Pulse: 65   Temp: 36.8 ?C (98.2 ?F)       ?  Physical Exam  Constitutional:       General: She is not in acute distress.     Appearance: Normal appearance.   Eyes:      General: No scleral icterus.     Extraocular Movements: Extraocular movements intact.      Conjunctiva/sclera: Conjunctivae normal.   Pulmonary:      Effort: Pulmonary effort is normal. No respiratory distress.   Chest:          Comments: Bandages in place from wound care visit. Previous incisions sites not covered by bandages indicate well healing scar.  Musculoskeletal:      Cervical back: Normal range of motion.   Skin:     General: Skin is warm and dry.   Neurological:      General: No focal deficit present.      Mental Status: She is alert and oriented to person, place, and time. Mental status is at baseline.         ?  ?  Assessment and Plan:  ?  Patient has been advised that should her dressing fall off prior to next week to please take a photo and send to the office. Otherwise, discussed patient coming to the office on Wednesday next week in lieu of wound care clinic so that sites can be evaluated. We can pack with OASIS per wound care's direction. Cause of L breast drainage remains unclear as CT did not demonstrate a fluid collection and wounds are superficial.    The drainage is from a superfical wound. There is no fluid collection per CT.   The appearance of the wounds shows that it should heal very soon with further wound care.    I also asked her to send [place] the bills to see if we can get this all covered under her insurance.     She said the [facility] cost $4K for 1 month. Of that $1900 was her responsibility.     I have reviewed her CT chest.  Have check her CMP and CBC in the past.   She has been eating more protein as I suggested.  Is not using nicotine products.     An underlying connective tissue disorder is not as likely  given that she has had multiple surgeries in the past.     She is still waiting for an appointment with a PCP. The order was placed and faxed to the referral line.   Sent her a MyChart message the the number to call.     Problem List Items Addressed This Visit     Hypertrophy of breast - Primary    Wound healing, delayed

## 2020-11-07 NOTE — Progress Notes
Outpatient [facility] Note  Name: Christina Garrison  MRN: [id number]  DOB: 02-Apr-1971  Age: 50 y.o.   Date of Service: 11/07/2020     Chief Complaint   Patient presents with   ? Wound     consult?       Subjective     HISTORY OF PRESENT ILLNESS:   Christina Garrison is a 50 yo female, Hx of macromastia, s/p reduction on 07/03/20  Has some small open areas on the suture line (related to dissolvable sutures?)  NPWT started  She was started on Levaquin on 02.18.2022 for concern of increased drainage but Chest CT does not confirm any fluid collection and no evidence of infectoin.   At today's visit, 11/07/20, she verbalizes dissatisfaction with appearance of her breasts and frustration with wounds.    Chest CT 11/02/20  No drainable fluid collection    07/03/20  MAMMAPLASTY REDUCTION (INSURANCE) (Bilateral)  LATERAL CHEST WALL LIPOSUCTION  (COSMETIC) LIPOSUCTION TO ABDOMEN AND FLANKS (Bilateral)         Review of Systems   Constitutional: Negative.    Skin: Positive for wound.       Allergies   Allergen Reactions   ? Effexor [Venlafaxine] SEE COMMENTS     Suicidal thoughts   ? Strawberry ANAPHYLAXIS and HIVES   ? Diovan [Valsartan] HIVES   ? Prozac [Fluoxetine] NAUSEA AND VOMITING and ANXIETY   ? Topamax [Topiramate] SEE COMMENTS     Feels like she is going to pass out and speech messed up   ? Decadron [Dexamethasone] NAUSEA AND VOMITING and SEE COMMENTS     Also causes agitation    ? Gabapentin SEE COMMENTS     She can't remember what the reaction was but doesn't want to take it again.       Current Outpatient Medications on File Prior to Visit   Medication Sig Dispense Refill   ? biotin 5,000 mcg TbDi Dissolve 2 tablets by mouth twice daily.     ? BYSTOLIC 5 mg tablet TAKE 1 TABLET BY MOUTH EVERY DAY 90 tablet 1   ? cholecalciferol (VITAMIN D-3) 1,000 units tablet Take 1,000 Units by mouth twice weekly.     ? clonazePAM (KLONOPIN) 0.5 mg tablet Take 1.5 tablets by mouth twice daily. (Patient taking differently: Take 0.75 mg by mouth daily.) 90 tablet 5   ? clonazePAM (KLONOPIN) 1 mg tablet Take one tablet by mouth as Needed (For driving). (Patient taking differently: Take 1 mg by mouth daily with dinner.) 30 tablet 5   ? cyanocobalamin (vitamin B-12) 2,500 mcg tab Take 1 tablet by mouth daily with breakfast.     ? diphenhydrAMINE (BENADRYL ALLERGY) 12.5 mg/5 mL oral solution Take 12.5 mg by mouth every 6 hours as needed.     ? divalproex (DEPAKOTE EC) 250 mg DR tablet Take three tablets by mouth daily. Take with food. 180 tablet 2   ? famotidine (PEPCID) 20 mg tablet Take 20 mg by mouth daily as needed.     ? methylPREDNIsolone (MEDROL (PAK)) 4 mg tablet Take medication as directed on package for 6 days. Take with food. 21 tablet 0   ? multivit, Ca, min-FA-soy isofl 400-60 mcg-mg tab Take 1 tablet by mouth twice daily.     ? mupirocin (CENTANY) 2 % topical ointment Apply  topically to affected area twice daily. 22 g 1   ? nystatin (NYSTOP) 100,000 unit/g topical powder Apply one g topically to affected area four times daily. 60  g 1   ? nystatin-triamcinolone 100,000 unit/g / 0.1 % topical cream Apply  topically to affected area twice daily as needed. Apply sparingly to area twice a day for 5 days followed by once a day for 5 days. 60 g 0   ? ondansetron (ZOFRAN ODT) 4 mg rapid dissolve tablet Dissolve one tablet by mouth every 8 hours as needed. Place on tongue to disolve. 15 tablet 0   ? pantoprazole DR (PROTONIX) 40 mg tablet TAKE 1 TABLET BY MOUTH EVERY DAY (Patient taking differently: Take 40 mg by mouth as Needed.) 90 tablet 0   ? Potassium 99 mg tab Take 1 tablet by mouth three times weekly.     ? prochlorperazine maleate (COMPAZINE) 10 mg tablet Take one tablet by mouth every 6 hours as needed for Nausea or Vomiting. 30 tablet 5   ? rosuvastatin (CRESTOR) 20 mg tablet TAKE 1 TABLET BY MOUTH EVERY DAY 90 tablet 3   ? scopolamine (TRANSDERM-SCOP) 1.5 mg 3 day patch Apply one patch to top of skin as directed every 72 hours. (Patient taking differently: Apply 1 patch to top of skin as directed as Needed.) 1 patch 3   ? sertraline (ZOLOFT) 100 mg tablet Take one-half tablet by mouth daily. Take with 25 mg for total 75 mg dose. 45 tablet 3   ? sertraline (ZOLOFT) 25 mg tablet Take one tablet by mouth daily. For a total of 75mg  daily.  Indications: anxiousness associated with depression 90 tablet 1   ? SUMAtriptan succinate (IMITREX) 50 mg tablet Take one tablet by mouth at onset of headache. May repeat after 2 hours if needed. Max of 200 mg in 24 hours. 9 tablet 3   ? verapamil (CALAN) 40 mg tablet TAKE 1/2 TABLET BY MOUTH IN THE MORNING AND TAKE 1 TABLET IN THE EVENING 135 tablet 3   ? vitamins, multi w/minerals 9 mg iron-400 mcg tab Take 1 tablet by mouth daily with breakfast.       No current facility-administered medications on file prior to visit.       Medical History:   Diagnosis Date   ? Arthritis    ? Back pain    ? Carpal tunnel syndrome on right 12/27/2019   ? COPD (chronic obstructive pulmonary disease) (HCC)    ? Dyspnea     per pt-post op 2015.  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 Generalized headaches    ? High cholesterol    ? Hypertension 11/21/2010   ? Interstitial cystitis    ? Migraines    ? Motion sickness    ? OSA (obstructive sleep apnea)     uses NC w CA   ? Pancreatitis 11/21/2010   ? 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 Psychiatric illness     Bipolar; Anxiety; Depression   ? Seasonal allergic reaction    ? Tobacco abuse    ? Vertigo        Surgical History:   Procedure Laterality Date   ? SINUS SURGERY  2004   ? PR LAPS GSTRC RSTRICTIV PX LONGITUDINAL GASTRECTOMY  03/2014    per pt code blue the day after-I couldn't breath- I had fluid on my lungs ([place])   ? EGD N/A 08/23/2018    Performed by Vertell Novak, MD at [facility] OR   ? COLONOSCOPY DIAGNOSTIC WITH SPECIMEN COLLECTION BY BRUSHING/ WASHING - FLEXIBLE N/A 08/23/2018    Performed  by Vertell Novak, MD at [facility] OR   ? OPEN REPAIR SUPRAUMBILICAL HERNIA WITH MESH N/A 09/23/2018    Performed by Lelan Pons., MD at [facility] OR   ? 24HR pH w/ Impedance -OFF PPIs N/A 01/27/2019    Performed by Tommie Sams, MD at [facility] ENDO   ? ESOPHAGEAL MOTILITY STUDY N/A 01/27/2019    Performed by Tommie Sams, MD at [facility] ENDO   ? ENDOSCOPIC RELEASE OF TRANSVERSE CARPAL LIGAMENT- WRIST Left 10/21/2019    Performed by Marylene Land, MD at [facility] OR   ? ENDOSCOPIC RELEASE OF TRANSVERSE CARPAL LIGAMENT- WRIST Right 12/27/2019    Performed by Marylene Land, MD at [facility] OR   ? BREAST REDUCTION Bilateral 07/23/2020   ? LIPOSUCTION  07/23/2020   ? MAMMAPLASTY REDUCTION (INSURANCE) Bilateral 07/23/2020    Performed by Erlene Quan, Zachary George, MD at [facility] OR   ? LATERAL CHEST WALL LIPOSUCTION  (COSMETIC) LIPOSUCTION TO ABDOMEN AND FLANKS Bilateral 07/23/2020    Performed by Stevenson Clinch, MD at [facility] OR   ? CARPAL TUNNEL RELEASE Bilateral 10/2019-12/2019   ? ELECTROCARDIOGRAM     ? HX APPENDECTOMY     ? HX CHOLECYSTECTOMY     ? HX PARTIAL HYSTERECTOMY         family history includes Allergy-severe in her mother; Asthma in her mother; Cancer (age of onset: 75) in her sister; Coronary Artery Disease in her father and mother; Diabetes in her brother, mother, and sister; Heart Attack in her father and mother; Heart Disease (age of onset: 66) in her sister; High Cholesterol in her father, mother, and sister; Hypertension in her brother, father, mother, and sister; Tumor in her sister; Tumor (age of onset: 3) in her sister.    Social History     Socioeconomic History   ? Marital status: Married     Spouse name: Not on file   ? Number of children: Not on file   ? Years of education: Not on file   ? Highest education level: Not on file   Occupational History   ? Not on file   Tobacco Use   ? Smoking status: Former Smoker     Packs/day: 1.50     Years: 30.00     Pack years: 45.00     Types: Cigarettes     Quit date: 05/02/2012     Years since quitting: 8.5   ? Smokeless tobacco: Never Used   ? Tobacco comment: Currently Vapes   Vaping Use   ? Vaping Use: Former   ? Quit date: 05/14/2020   ? Substances: Nicotine   Substance and Sexual Activity   ? Alcohol use: No     Alcohol/week: 0.0 standard drinks   ? Drug use: No   ? Sexual activity: Not Currently     Birth control/protection: Surgical   Other Topics Concern   ? Not on file   Social History Narrative   ? Not on file                   PHYSICAL EXAM:  Vitals:    11/07/20 1310   BP: (!) 144/78   Pulse: 67   Resp: 18   Temp: 36.7 ?C (98 ?F)   SpO2: 97%   Weight: 96.2 kg (212 lb)   Height: 165.1 cm (5' 5)   PainSc: Five       Pain Score: Five    Body mass index is  35.28 kg/m?Marland Kitchen    Physical Exam  Constitutional:       Appearance: Normal appearance. She is obese.   HENT:      Head: Normocephalic.   Skin:     Comments: Right breast: small superficial area, no depth, no drainage  Left breast: medial wound superficial, no depth, wound bed pink  Left breast: lateral wound with depth, bloody drainage, no surrounding erythema   Neurological:      Mental Status: She is alert and oriented to person, place, and time.   Psychiatric:         Mood and Affect: Mood normal.         Behavior: Behavior normal.         Thought Content: Thought content normal.         Judgment: Judgment normal.                Wounds 11/07/20 1319 Surgical incision Left;Lateral Breast (Active)   11/07/20 1319 Breast   Wound Type: Surgical incision   Pressure Injury Stages (For Pressure Injury Wound Type Only):    Pressure Injury Present On Inpatient Admission:    If this pressure injury is suspected to be device related, please select the device::    Wound/Pressure Injury Orientation: Left;Lateral   Wound Location Comments:    ZXWound Location:    Wound Description (Comments):    Wound Type::    Wound Image    11/07/20 1300   Wound Dressing Status Changed 11/07/20 1300   Wound Drainage Description Serosanguineous 11/07/20 1300   Wound Drainage Amount Moderate 11/07/20 1300   Wound Base Assessment Granulation 11/07/20 1300   Surrounding Skin Assessment Other (Comment);Pink 11/07/20 1300   Wound Status (Wound Team Only) Being Treated 11/07/20 1300   Wound Length (cm) 0.2 cm 11/07/20 1300   Wound Width (cm) 0.8 cm 11/07/20 1300   Wound Depth (cm) 0.6 cm 11/07/20 1300   Wound Surface Area (cm^2) 0.16 cm^2 11/07/20 1300   Wound Volume (cm^3) 0.096 cm^3 11/07/20 1300   Undermining in CM (Wound Team Only) 0.5 cm 11/07/20 1300   Undermining Location (Wound Team Only) 9-3; deepest @12  11/07/20 1300   Tunneling in CM (Wound Team Only) 0 cm 11/07/20 1300   Number of days: 0       Wounds 11/07/20 1320 Surgical incision Left;Anterior Breast (Active)   11/07/20 1320 Breast   Wound Type: Surgical incision   Pressure Injury Stages (For Pressure Injury Wound Type Only):    Pressure Injury Present On Inpatient Admission:    If this pressure injury is suspected to be device related, please select the device::    Wound/Pressure Injury Orientation: Left;Anterior   Wound Location Comments:    ZXWound Location:    Wound Description (Comments):    Wound Type::    Wound Image   11/07/20 1300   Wound Dressing Status Changed 11/07/20 1300   Wound Drainage Description Serous 11/07/20 1300   Wound Drainage Amount Moderate 11/07/20 1300   Wound Base Assessment Granulation 11/07/20 1300   Surrounding Skin Assessment Pink 11/07/20 1300   Wound Status (Wound Team Only) Being Treated 11/07/20 1300   Wound Length (cm) 0.6 cm 11/07/20 1300   Wound Width (cm) 0.5 cm 11/07/20 1300   Wound Depth (cm) 0.1 cm 11/07/20 1300   Wound Surface Area (cm^2) 0.3 cm^2 11/07/20 1300   Wound Volume (cm^3) 0.03 cm^3 11/07/20 1300   Undermining in CM (Wound Team Only) 0 cm 11/07/20 1300  Tunneling in CM (Wound Team Only) 0 cm 11/07/20 1300   Number of days: 0       Wounds 11/07/20 1321 Surgical incision Right;Anterior Breast (Active)   11/07/20 1321 Breast   Wound Type: Surgical incision   Pressure Injury Stages (For Pressure Injury Wound Type Only):    Pressure Injury Present On Inpatient Admission:    If this pressure injury is suspected to be device related, please select the device::    Wound/Pressure Injury Orientation: Right;Anterior   Wound Location Comments:    ZXWound Location:    Wound Description (Comments):    Wound Type::    Wound Image   11/07/20 1300   Wound Dressing Status Changed 11/07/20 1300   Wound Drainage Description Serous 11/07/20 1300   Wound Drainage Amount Moderate 11/07/20 1300   Wound Base Assessment Granulation 11/07/20 1300   Surrounding Skin Assessment Pink;Other (Comment) 11/07/20 1300   Wound Status (Wound Team Only) Being Treated 11/07/20 1300   Wound Length (cm) 0.3 cm 11/07/20 1300   Wound Width (cm) 0.5 cm 11/07/20 1300   Wound Depth (cm) 0.2 cm 11/07/20 1300   Wound Surface Area (cm^2) 0.15 cm^2 11/07/20 1300   Wound Volume (cm^3) 0.03 cm^3 11/07/20 1300   Undermining in CM (Wound Team Only) 0 cm 11/07/20 1300   Tunneling in CM (Wound Team Only) 0 cm 11/07/20 1300   Number of days: 0              Wound closure met (95% or greater): No  Wound debridement performed using: autolytic    Sodium   Date Value Ref Range Status   10/10/2020 141 137 - 147 MMOL/L Final     Potassium   Date Value Ref Range Status   10/10/2020 4.4 3.5 - 5.1 MMOL/L Final     Chloride   Date Value Ref Range Status   10/10/2020 102 98 - 110 MMOL/L Final     Glucose   Date Value Ref Range Status   10/10/2020 74 70 - 100 MG/DL Final     Blood Urea Nitrogen   Date Value Ref Range Status   10/10/2020 13 7 - 25 MG/DL Final     Creatinine   Date Value Ref Range Status   10/10/2020 0.61 0.4 - 1.00 MG/DL Final     Calcium   Date Value Ref Range Status   10/10/2020 9.2 8.5 - 10.6 MG/DL Final     Total Protein   Date Value Ref Range Status   10/10/2020 6.2 6.0 - 8.0 G/DL Final     Total Bilirubin   Date Value Ref Range Status   10/10/2020 0.3 0.3 - 1.2 MG/DL Final     Albumin   Date Value Ref Range Status   10/10/2020 4.1 3.5 - 5.0 G/DL Final     Alk Phosphatase   Date Value Ref Range Status   10/10/2020 83 25 - 110 U/L Final     AST (SGOT)   Date Value Ref Range Status   10/10/2020 16 7 - 40 U/L Final     CO2   Date Value Ref Range Status   10/10/2020 28 21 - 30 MMOL/L Final     ALT (SGPT)   Date Value Ref Range Status   10/10/2020 14 7 - 56 U/L Final     Anion Gap   Date Value Ref Range Status   10/10/2020 11 3 - 12 Final     eGFR Non African American  Date Value Ref Range Status   07/13/2020 >60 >60 mL/min Final     Comment:     The eGFR is not validated for use in drug dosing adjustments.  Continue to use   estimated creatinine clearance per dosing reference text.  Please contact the   Clinical Pharmacist for questions.       eGFR African American   Date Value Ref Range Status   07/13/2020 >60 >60 mL/min Final     Comment:     The eGFR is not validated for use in drug dosing adjustments.  Continue to use   estimated creatinine clearance per dosing reference text.  Please contact the   Clinical Pharmacist for questions.       Hematocrit   Date Value Ref Range Status   10/10/2020 41.3 36 - 45 % Final     Hemoglobin   Date Value Ref Range Status   10/10/2020 13.8 12.0 - 15.0 GM/DL Final      No results found for: AMP, BAR, COC, BENZO, PCP, THC, OPI, OPIATES300       IMPRESSION:    1. Non-healing surgical wound, initial encounter    2. Wound healing, delayed          PLAN:    OASIS, allevyn foam  D/C'd VAC  The wounds are very superficial and should heal without issue  RTC next week for OASIS

## 2020-11-07 NOTE — Progress Notes
DEBRIDE/CELLULAR TISSUE    Date/Time: 11/07/2020 1:00 PM  Performed by: Raleigh Lions, APRN  Authorized by: Raleigh Lions, APRN     Consent:     Consent obtained:  Verbal    Consent given by:  Patient    Risks discussed:  Bleeding, infection and pain    Alternatives discussed:  Alternative treatment and no treatment    Procedure details:     Wound exploration location: extremity      Wound age (days):  >14    Debridement level: skin, partial thickness    Debridement is Non-Excisional Indications: Open Wound Laterality: Left Anesthesia Used: No Pre Debridement Volume: 0.03 Tissue Removed: Biofilm Cellular Tissue Type: Oasis Cellular Tissue product measurement: 6 Product Waste: Cellular Tissue Product was used in it's entirety, Debridement Method: Other Debridement Type: SubQ Bleeding: None   Dressing:         Cellular Tissue Type Oasis         Skin Substitute Location: Trunk    Applied <=25 Sq cm of Skin Subsitute  Wound Prep: <100 cm   Wound Prep Location: Trunk  Held Using Steri-Strips    Presence of Infection: No Presence of Osteomyelitis: No Adequate Blood Flow: Yes      Post-procedure details:     Patient tolerance of procedure:  Patient tolerated the procedure well with no immediate complications

## 2020-11-07 NOTE — Progress Notes
DEBRIDE/CELLULAR TISSUE    Date/Time: 11/07/2020 1:00 PM  Performed by: Raleigh Lions, APRN  Authorized by: Raleigh Lions, APRN     Consent:     Consent obtained:  Verbal    Consent given by:  Patient    Risks discussed:  Bleeding, infection and pain    Alternatives discussed:  Alternative treatment and no treatment    Procedure details:     Wound exploration location: flank      Wound age (days):  >14    Debridement level: skin, full thickness    Debridement is Non-Excisional Indications: Open Wound Laterality: Left Anesthesia Used: No Pre Debridement Volume: 0.1 Tissue Removed: Biofilm Cellular Tissue Type: Oasis Cellular Tissue product measurement: 15 Product Waste: Cellular Tissue Product was used in it's entirety, Debridement Method: Other Debridement Type: SubQ Bleeding: Minimal Hemostatis Achieved: Pressure   Dressing:         Cellular Tissue Type Oasis     Lot # [id number],     Skin Substitute Location: Trunk    Applied <=25 Sq cm of Skin Subsitute  Wound Prep: <100 cm   Wound Prep Location: Trunk  Held Using Steri-Strips    Presence of Infection: No Presence of Osteomyelitis: No Adequate Blood Flow: Yes      Post-procedure details:     Patient tolerance of procedure:  Patient tolerated the procedure well with no immediate complications

## 2020-11-08 ENCOUNTER — Encounter: Admit: 2020-11-08 | Discharge: 2020-11-08 | Payer: PRIVATE HEALTH INSURANCE

## 2020-11-09 ENCOUNTER — Encounter: Admit: 2020-11-09 | Discharge: 2020-11-09 | Payer: PRIVATE HEALTH INSURANCE

## 2020-11-09 NOTE — Telephone Encounter
Spoke with pt who is very upset over the costs of her visit on 11/07/20. Let pt know that I do not handle this aspect of the clinic visit but I would let my supervisor know that pt would like to speak to her. Pt has reached out to billing already. Will forward pt's phone number to my supervisor, Florentina Jenny. Pt stated verbal understanding.

## 2020-11-14 ENCOUNTER — Encounter: Admit: 2020-11-14 | Discharge: 2020-11-14 | Payer: PRIVATE HEALTH INSURANCE

## 2020-11-14 ENCOUNTER — Ambulatory Visit: Admit: 2020-11-14 | Discharge: 2020-11-15 | Payer: Commercial Managed Care - HMO

## 2020-11-14 DIAGNOSIS — T8131XD Disruption of external operation (surgical) wound, not elsewhere classified, subsequent encounter: Secondary | ICD-10-CM

## 2020-11-14 DIAGNOSIS — Z720 Tobacco use: Secondary | ICD-10-CM

## 2020-11-14 DIAGNOSIS — F99 Mental disorder, not otherwise specified: Secondary | ICD-10-CM

## 2020-11-14 DIAGNOSIS — G4733 Obstructive sleep apnea (adult) (pediatric): Secondary | ICD-10-CM

## 2020-11-14 DIAGNOSIS — G5601 Carpal tunnel syndrome, right upper limb: Secondary | ICD-10-CM

## 2020-11-14 DIAGNOSIS — N3010 Interstitial cystitis (chronic) without hematuria: Secondary | ICD-10-CM

## 2020-11-14 DIAGNOSIS — J449 Chronic obstructive pulmonary disease, unspecified: Secondary | ICD-10-CM

## 2020-11-14 DIAGNOSIS — R42 Dizziness and giddiness: Secondary | ICD-10-CM

## 2020-11-14 DIAGNOSIS — R519 Generalized headaches: Secondary | ICD-10-CM

## 2020-11-14 DIAGNOSIS — G44039 Episodic paroxysmal hemicrania, not intractable: Secondary | ICD-10-CM

## 2020-11-14 DIAGNOSIS — J302 Other seasonal allergic rhinitis: Secondary | ICD-10-CM

## 2020-11-14 DIAGNOSIS — M549 Dorsalgia, unspecified: Secondary | ICD-10-CM

## 2020-11-14 DIAGNOSIS — E7800 Pure hypercholesterolemia, unspecified: Secondary | ICD-10-CM

## 2020-11-14 DIAGNOSIS — R0600 Dyspnea, unspecified: Secondary | ICD-10-CM

## 2020-11-14 DIAGNOSIS — I10 Essential (primary) hypertension: Secondary | ICD-10-CM

## 2020-11-14 DIAGNOSIS — T148XXD Other injury of unspecified body region, subsequent encounter: Secondary | ICD-10-CM

## 2020-11-14 DIAGNOSIS — G43909 Migraine, unspecified, not intractable, without status migrainosus: Secondary | ICD-10-CM

## 2020-11-14 DIAGNOSIS — K859 Acute pancreatitis without necrosis or infection, unspecified: Secondary | ICD-10-CM

## 2020-11-14 DIAGNOSIS — M1990 Unspecified osteoarthritis, unspecified site: Secondary | ICD-10-CM

## 2020-11-14 DIAGNOSIS — T753XXA Motion sickness, initial encounter: Secondary | ICD-10-CM

## 2020-11-14 NOTE — Progress Notes
Subjective:       History of Present Illness  Christina Garrison is a 50 y.o. female.  Patient was seen in wound care last week. She got a bill for $9K.  So she is waiting for a call back from patient relations.      Review of Systems   Constitutional: Negative.    HENT: Negative.    Eyes: Negative.    Respiratory: Negative.    Cardiovascular: Negative.    Gastrointestinal: Negative.    Endocrine: Negative.    Genitourinary: Negative.    Musculoskeletal: Negative.    Skin: Negative.    Allergic/Immunologic: Negative.    Neurological: Positive for headaches.   Hematological: Negative.    Psychiatric/Behavioral: Negative.          Objective:         ? biotin 5,000 mcg TbDi Dissolve 2 tablets by mouth twice daily.   ? BYSTOLIC 5 mg tablet TAKE 1 TABLET BY MOUTH EVERY DAY   ? cholecalciferol (VITAMIN D-3) 1,000 units tablet Take 1,000 Units by mouth twice weekly.   ? clonazePAM (KLONOPIN) 0.5 mg tablet Take 1.5 tablets by mouth twice daily. (Patient taking differently: Take 0.75 mg by mouth daily.)   ? clonazePAM (KLONOPIN) 1 mg tablet Take one tablet by mouth as Needed (For driving). (Patient taking differently: Take 1 mg by mouth daily with dinner.)   ? cyanocobalamin (vitamin B-12) 2,500 mcg tab Take 1 tablet by mouth daily with breakfast.   ? diphenhydrAMINE (BENADRYL ALLERGY) 12.5 mg/5 mL oral solution Take 12.5 mg by mouth every 6 hours as needed.   ? divalproex (DEPAKOTE EC) 250 mg DR tablet Take three tablets by mouth daily. Take with food.   ? famotidine (PEPCID) 20 mg tablet Take 20 mg by mouth daily as needed.   ? multivit, Ca, min-FA-soy isofl 400-60 mcg-mg tab Take 1 tablet by mouth twice daily.   ? mupirocin (CENTANY) 2 % topical ointment Apply  topically to affected area twice daily.   ? nystatin (NYSTOP) 100,000 unit/g topical powder Apply one g topically to affected area four times daily.   ? nystatin-triamcinolone 100,000 unit/g / 0.1 % topical cream Apply  topically to affected area twice daily as needed. Apply sparingly to area twice a day for 5 days followed by once a day for 5 days.   ? ondansetron (ZOFRAN ODT) 4 mg rapid dissolve tablet Dissolve one tablet by mouth every 8 hours as needed. Place on tongue to disolve.   ? pantoprazole DR (PROTONIX) 40 mg tablet TAKE 1 TABLET BY MOUTH EVERY DAY (Patient taking differently: Take 40 mg by mouth as Needed.)   ? Potassium 99 mg tab Take 1 tablet by mouth three times weekly.   ? prochlorperazine maleate (COMPAZINE) 10 mg tablet Take one tablet by mouth every 6 hours as needed for Nausea or Vomiting.   ? rosuvastatin (CRESTOR) 20 mg tablet TAKE 1 TABLET BY MOUTH EVERY DAY   ? scopolamine (TRANSDERM-SCOP) 1.5 mg 3 day patch Apply one patch to top of skin as directed every 72 hours. (Patient taking differently: Apply 1 patch to top of skin as directed as Needed.)   ? sertraline (ZOLOFT) 100 mg tablet Take one-half tablet by mouth daily. Take with 25 mg for total 75 mg dose.   ? sertraline (ZOLOFT) 25 mg tablet Take one tablet by mouth daily. For a total of 75mg  daily.  Indications: anxiousness associated with depression   ? SUMAtriptan succinate (IMITREX) 50 mg  tablet Take one tablet by mouth at onset of headache. May repeat after 2 hours if needed. Max of 200 mg in 24 hours.   ? verapamil (CALAN) 40 mg tablet TAKE 1/2 TABLET BY MOUTH IN THE MORNING AND TAKE 1 TABLET IN THE EVENING   ? vitamins, multi w/minerals 9 mg iron-400 mcg tab Take 1 tablet by mouth daily with breakfast.     Vitals:    11/14/20 1400   BP: 136/44   BP Source: Arm, Left Upper   Patient Position: Sitting   Pulse: 70   Temp: 36.9 ?C (98.4 ?F)   TempSrc: Skin   Weight: 102.5 kg (226 lb)   Height: 165.1 cm (5' 5)   PainSc: Four     Body mass index is 37.61 kg/m?Marland Kitchen     Physical Exam  Vitals reviewed.   HENT:      Head: Normocephalic.   Cardiovascular:      Rate and Rhythm: Normal rate.   Pulmonary:      Effort: Pulmonary effort is normal.   Chest:          Comments: Blue marks are the open areas, 1-2 mm in diameter. Superficial with only epithelium missing.  Chilton Si marks are the areas with skin discoloration, but no open wound of the skin.     The left lateral incision has areas of thicker skin superior and inferior to it, not scar adjacent.   Neurological:      Mental Status: She is alert and oriented to person, place, and time.              Assessment and Plan:    The OASIS that was placed last week fell off after 1 day. Her wounds have improved significantly, but may not be due to the OASIS because of the length of time the OASIS was on.     She should inquire about the billing through patient relations.   Our billing manager is out on medical leave.     She asked about using the VAC again. I told her I would rather not because I don't want the sponge to impede epithelialization. But if the wounds get larger, then she can use the VAC.    I would like her to use eucerin or aquaphor moisturizer to the incisions. Maybe she can use a small topical antibiotic to the open areas, but these areas are so small it will be difficult to isolate.   If the wound worsens, let me know.  She will see our NP in 2 weeks. Told her I am out next week.    Explained that wounded skin is 80% as strong as normal skin. But her scars should not open with normal ROM.     Problem List Items Addressed This Visit     Wound healing, delayed - Primary    Wound dehiscence, surgical
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
## 2020-11-22 ENCOUNTER — Encounter: Admit: 2020-11-22 | Discharge: 2020-11-22 | Payer: PRIVATE HEALTH INSURANCE

## 2020-11-22 ENCOUNTER — Ambulatory Visit: Admit: 2020-11-22 | Discharge: 2020-11-23 | Payer: Commercial Managed Care - HMO

## 2020-11-22 DIAGNOSIS — J449 Chronic obstructive pulmonary disease, unspecified: Secondary | ICD-10-CM

## 2020-11-22 DIAGNOSIS — R42 Dizziness and giddiness: Secondary | ICD-10-CM

## 2020-11-22 DIAGNOSIS — F99 Mental disorder, not otherwise specified: Secondary | ICD-10-CM

## 2020-11-22 DIAGNOSIS — J302 Other seasonal allergic rhinitis: Secondary | ICD-10-CM

## 2020-11-22 DIAGNOSIS — M1990 Unspecified osteoarthritis, unspecified site: Secondary | ICD-10-CM

## 2020-11-22 DIAGNOSIS — G5601 Carpal tunnel syndrome, right upper limb: Secondary | ICD-10-CM

## 2020-11-22 DIAGNOSIS — M549 Dorsalgia, unspecified: Secondary | ICD-10-CM

## 2020-11-22 DIAGNOSIS — G44039 Episodic paroxysmal hemicrania, not intractable: Secondary | ICD-10-CM

## 2020-11-22 DIAGNOSIS — T753XXA Motion sickness, initial encounter: Secondary | ICD-10-CM

## 2020-11-22 DIAGNOSIS — I10 Essential (primary) hypertension: Secondary | ICD-10-CM

## 2020-11-22 DIAGNOSIS — R0600 Dyspnea, unspecified: Secondary | ICD-10-CM

## 2020-11-22 DIAGNOSIS — G4733 Obstructive sleep apnea (adult) (pediatric): Secondary | ICD-10-CM

## 2020-11-22 DIAGNOSIS — Z720 Tobacco use: Secondary | ICD-10-CM

## 2020-11-22 DIAGNOSIS — E7800 Pure hypercholesterolemia, unspecified: Secondary | ICD-10-CM

## 2020-11-22 DIAGNOSIS — F4310 Post-traumatic stress disorder, unspecified: Secondary | ICD-10-CM

## 2020-11-22 DIAGNOSIS — F341 Dysthymic disorder: Secondary | ICD-10-CM

## 2020-11-22 DIAGNOSIS — F418 Other specified anxiety disorders: Secondary | ICD-10-CM

## 2020-11-22 DIAGNOSIS — R519 Generalized headaches: Secondary | ICD-10-CM

## 2020-11-22 DIAGNOSIS — K859 Acute pancreatitis without necrosis or infection, unspecified: Secondary | ICD-10-CM

## 2020-11-22 DIAGNOSIS — N3010 Interstitial cystitis (chronic) without hematuria: Secondary | ICD-10-CM

## 2020-11-22 DIAGNOSIS — G43909 Migraine, unspecified, not intractable, without status migrainosus: Secondary | ICD-10-CM

## 2020-11-22 MED ORDER — BUPROPION HCL 75 MG PO TAB
75 mg | ORAL_TABLET | Freq: Two times a day (BID) | ORAL | 3 refills | Status: AC
Start: 2020-11-22 — End: ?

## 2020-11-22 NOTE — Progress Notes
ATTENDING NOTE  I discussed Christina Garrison with Era Skeen, MD and concur with the assessment and treatment plan. Patient is 50 y.o. female with MDD, GAD and PTSD. Pt reports increased symptoms of fatigue and depression due to surgical procedures. Denies SI/HI and AVH and no other safety concerns. Pt reports no medication side effects.    PLAN:  The following medication changes were made during this visit to better treat the above symptoms:  1. Continue Klonopin 1.5mg  PO BID and 1mg  PO Daily PRN  2. Continue Zoloft 75mg  PO Daily  3. Continue Depakote 250mg  PO BID  4. Start Wellbutrin IR 75mg  PO BID   5. Recommend Psychotherapy  6. No labs needed    ? biotin 5,000 mcg TbDi Dissolve 2 tablets by mouth twice daily.   ? BYSTOLIC 5 mg tablet TAKE 1 TABLET BY MOUTH EVERY DAY   ? cholecalciferol (VITAMIN D-3) 1,000 units tablet Take 1,000 Units by mouth twice weekly.   ? clonazePAM (KLONOPIN) 0.5 mg tablet Take 1.5 tablets by mouth twice daily. (Patient taking differently: Take 0.75 mg by mouth daily.)   ? clonazePAM (KLONOPIN) 1 mg tablet Take one tablet by mouth as Needed (For driving). (Patient taking differently: Take 1 mg by mouth daily with dinner.)   ? cyanocobalamin (vitamin B-12) 2,500 mcg tab Take 1 tablet by mouth daily with breakfast.   ? diphenhydrAMINE (BENADRYL ALLERGY) 12.5 mg/5 mL oral solution Take 12.5 mg by mouth every 6 hours as needed.   ? divalproex (DEPAKOTE EC) 250 mg DR tablet Take three tablets by mouth daily. Take with food.   ? famotidine (PEPCID) 20 mg tablet Take 20 mg by mouth daily as needed.   ? multivit, Ca, min-FA-soy isofl 400-60 mcg-mg tab Take 1 tablet by mouth twice daily.   ? mupirocin (CENTANY) 2 % topical ointment Apply  topically to affected area twice daily.   ? nystatin (NYSTOP) 100,000 unit/g topical powder Apply one g topically to affected area four times daily.   ? nystatin-triamcinolone 100,000 unit/g / 0.1 % topical cream Apply  topically to affected area twice daily as needed. Apply sparingly to area twice a day for 5 days followed by once a day for 5 days.   ? ondansetron (ZOFRAN ODT) 4 mg rapid dissolve tablet Dissolve one tablet by mouth every 8 hours as needed. Place on tongue to disolve.   ? pantoprazole DR (PROTONIX) 40 mg tablet TAKE 1 TABLET BY MOUTH EVERY DAY (Patient taking differently: Take 40 mg by mouth as Needed.)   ? Potassium 99 mg tab Take 1 tablet by mouth three times weekly.   ? prochlorperazine maleate (COMPAZINE) 10 mg tablet Take one tablet by mouth every 6 hours as needed for Nausea or Vomiting.   ? rosuvastatin (CRESTOR) 20 mg tablet TAKE 1 TABLET BY MOUTH EVERY DAY   ? scopolamine (TRANSDERM-SCOP) 1.5 mg 3 day patch Apply one patch to top of skin as directed every 72 hours. (Patient taking differently: Apply 1 patch to top of skin as directed as Needed.)   ? sertraline (ZOLOFT) 100 mg tablet Take one-half tablet by mouth daily. Take with 25 mg for total 75 mg dose.   ? sertraline (ZOLOFT) 25 mg tablet Take one tablet by mouth daily. For a total of 75mg  daily.  Indications: anxiousness associated with depression   ? SUMAtriptan succinate (IMITREX) 50 mg tablet Take one tablet by mouth at onset of headache. May repeat after 2 hours if needed. Max of  200 mg in 24 hours.   ? verapamil (CALAN) 40 mg tablet TAKE 1/2 TABLET BY MOUTH IN THE MORNING AND TAKE 1 TABLET IN THE EVENING   ? vitamins, multi w/minerals 9 mg iron-400 mcg tab Take 1 tablet by mouth daily with breakfast.       Rae Mar, MD  11/22/2020

## 2020-11-25 ENCOUNTER — Encounter: Admit: 2020-11-25 | Discharge: 2020-11-25 | Payer: PRIVATE HEALTH INSURANCE

## 2020-11-28 ENCOUNTER — Encounter: Admit: 2020-11-28 | Discharge: 2020-11-28 | Payer: PRIVATE HEALTH INSURANCE

## 2020-11-28 ENCOUNTER — Ambulatory Visit: Admit: 2020-11-28 | Discharge: 2020-11-29 | Payer: Commercial Managed Care - HMO

## 2020-11-28 DIAGNOSIS — Z720 Tobacco use: Secondary | ICD-10-CM

## 2020-11-28 DIAGNOSIS — M1990 Unspecified osteoarthritis, unspecified site: Secondary | ICD-10-CM

## 2020-11-28 DIAGNOSIS — T148XXD Other injury of unspecified body region, subsequent encounter: Secondary | ICD-10-CM

## 2020-11-28 DIAGNOSIS — T8131XD Disruption of external operation (surgical) wound, not elsewhere classified, subsequent encounter: Secondary | ICD-10-CM

## 2020-11-28 DIAGNOSIS — R42 Dizziness and giddiness: Secondary | ICD-10-CM

## 2020-11-28 DIAGNOSIS — J302 Other seasonal allergic rhinitis: Secondary | ICD-10-CM

## 2020-11-28 DIAGNOSIS — K859 Acute pancreatitis without necrosis or infection, unspecified: Secondary | ICD-10-CM

## 2020-11-28 DIAGNOSIS — N3010 Interstitial cystitis (chronic) without hematuria: Secondary | ICD-10-CM

## 2020-11-28 DIAGNOSIS — R0600 Dyspnea, unspecified: Secondary | ICD-10-CM

## 2020-11-28 DIAGNOSIS — F99 Mental disorder, not otherwise specified: Secondary | ICD-10-CM

## 2020-11-28 DIAGNOSIS — N62 Hypertrophy of breast: Principal | ICD-10-CM

## 2020-11-28 DIAGNOSIS — G4733 Obstructive sleep apnea (adult) (pediatric): Secondary | ICD-10-CM

## 2020-11-28 DIAGNOSIS — J449 Chronic obstructive pulmonary disease, unspecified: Secondary | ICD-10-CM

## 2020-11-28 DIAGNOSIS — G43909 Migraine, unspecified, not intractable, without status migrainosus: Secondary | ICD-10-CM

## 2020-11-28 DIAGNOSIS — G5601 Carpal tunnel syndrome, right upper limb: Secondary | ICD-10-CM

## 2020-11-28 DIAGNOSIS — E7800 Pure hypercholesterolemia, unspecified: Secondary | ICD-10-CM

## 2020-11-28 DIAGNOSIS — G44039 Episodic paroxysmal hemicrania, not intractable: Secondary | ICD-10-CM

## 2020-11-28 DIAGNOSIS — I10 Essential (primary) hypertension: Secondary | ICD-10-CM

## 2020-11-28 DIAGNOSIS — T753XXA Motion sickness, initial encounter: Secondary | ICD-10-CM

## 2020-11-28 DIAGNOSIS — R519 Generalized headaches: Secondary | ICD-10-CM

## 2020-11-28 DIAGNOSIS — M549 Dorsalgia, unspecified: Secondary | ICD-10-CM

## 2020-11-28 NOTE — Progress Notes
Subjective:       History of Present Illness  Christina Garrison is a 50 y.o. female.       Review of Systems   Constitutional: Negative.    HENT: Negative.    Eyes: Negative.    Respiratory: Negative.    Cardiovascular: Negative.    Gastrointestinal: Negative.    Endocrine: Negative.    Genitourinary: Negative.    Musculoskeletal: Negative.    Skin: Negative.    Allergic/Immunologic: Negative.    Neurological: Negative.    Hematological: Negative.    Psychiatric/Behavioral: Negative.          Objective:         ? biotin 5,000 mcg TbDi Dissolve 2 tablets by mouth twice daily.   ? buPROPion HCL (WELLBUTRIN) 75 mg tablet Take one tablet by mouth twice daily. Indications: major depressive disorder   ? BYSTOLIC 5 mg tablet TAKE 1 TABLET BY MOUTH EVERY DAY   ? cholecalciferol (VITAMIN D-3) 1,000 units tablet Take 1,000 Units by mouth twice weekly.   ? clonazePAM (KLONOPIN) 0.5 mg tablet Take 1.5 tablets by mouth twice daily. (Patient taking differently: Take 0.75 mg by mouth daily.)   ? clonazePAM (KLONOPIN) 1 mg tablet Take one tablet by mouth as Needed (For driving). (Patient taking differently: Take 1 mg by mouth daily with dinner.)   ? cyanocobalamin (vitamin B-12) 2,500 mcg tab Take 1 tablet by mouth daily with breakfast.   ? diphenhydrAMINE (BENADRYL ALLERGY) 12.5 mg/5 mL oral solution Take 12.5 mg by mouth every 6 hours as needed.   ? divalproex (DEPAKOTE EC) 250 mg DR tablet Take three tablets by mouth daily. Take with food.   ? famotidine (PEPCID) 20 mg tablet Take 20 mg by mouth daily as needed.   ? multivit, Ca, min-FA-soy isofl 400-60 mcg-mg tab Take 1 tablet by mouth twice daily.   ? mupirocin (CENTANY) 2 % topical ointment Apply  topically to affected area twice daily.   ? nystatin (NYSTOP) 100,000 unit/g topical powder Apply one g topically to affected area four times daily.   ? nystatin-triamcinolone 100,000 unit/g / 0.1 % topical cream Apply  topically to affected area twice daily as needed. Apply sparingly to area twice a day for 5 days followed by once a day for 5 days.   ? ondansetron (ZOFRAN ODT) 4 mg rapid dissolve tablet Dissolve one tablet by mouth every 8 hours as needed. Place on tongue to disolve.   ? pantoprazole DR (PROTONIX) 40 mg tablet TAKE 1 TABLET BY MOUTH EVERY DAY (Patient taking differently: Take 40 mg by mouth as Needed.)   ? Potassium 99 mg tab Take 1 tablet by mouth three times weekly.   ? prochlorperazine maleate (COMPAZINE) 10 mg tablet Take one tablet by mouth every 6 hours as needed for Nausea or Vomiting.   ? rosuvastatin (CRESTOR) 20 mg tablet TAKE 1 TABLET BY MOUTH EVERY DAY   ? scopolamine (TRANSDERM-SCOP) 1.5 mg 3 day patch Apply one patch to top of skin as directed every 72 hours. (Patient taking differently: Apply 1 patch to top of skin as directed as Needed.)   ? sertraline (ZOLOFT) 100 mg tablet Take one-half tablet by mouth daily. Take with 25 mg for total 75 mg dose.   ? sertraline (ZOLOFT) 25 mg tablet Take one tablet by mouth daily. For a total of 75mg  daily.  Indications: anxiousness associated with depression   ? SUMAtriptan succinate (IMITREX) 50 mg tablet Take one tablet by mouth at  onset of headache. May repeat after 2 hours if needed. Max of 200 mg in 24 hours.   ? verapamil (CALAN) 40 mg tablet TAKE 1/2 TABLET BY MOUTH IN THE MORNING AND TAKE 1 TABLET IN THE EVENING   ? vitamins, multi w/minerals 9 mg iron-400 mcg tab Take 1 tablet by mouth daily with breakfast.     Vitals:    11/28/20 1429   BP: 122/78   BP Source: Arm, Left Upper   Patient Position: Sitting   Pulse: 65   Temp: 36.5 ?C (97.7 ?F)   TempSrc: Skin   Weight: 97.1 kg (214 lb)   Height: 165.1 cm (5' 5)   PainSc: Two     Body mass index is 35.61 kg/m?Marland Kitchen     Physical Exam         Assessment and Plan:

## 2020-11-28 NOTE — Progress Notes
Subjective:       History of Present Illness  Christina Garrison is a 50 y.o. female      Here today for wound follow up  Christina Garrison is doing well. Her home health nurse did place the wound vac to right IMF wound late last night. The wound opened and noted a small amount of green drainage. The home health nurse will come back to see her on Friday to remove the vac and reassess the wound. The plan is to leave the vac off. The home health nurse will then follow up with Christina Garrison the following week to assess. Christina Garrison states the home health nurse has been using a green gauze, that's stiff in nature to pack wound, which has been helping. She is unsure of the name of the green gauze.    Her left breast has healed since she was last seen. She did have an open wound along left breast IMF, vac was placed about a week ago. It on for a few days, then taken off and all incisions to left breast are now healed.    Her home health nurse continues to follow her wounds at home               Review of Systems   Constitutional: Negative.    HENT: Negative.    Eyes: Negative.    Respiratory: Negative.    Cardiovascular: Negative.    Gastrointestinal: Negative.    Endocrine: Negative.    Christina: Negative.    Musculoskeletal: Negative.    Skin: Negative.    Allergic/Immunologic: Negative.    Neurological: Positive for headaches.   Hematological: Negative.    Psychiatric/Behavioral: Negative.    All other systems reviewed and are negative.        Objective:         ? biotin 5,000 mcg TbDi Dissolve 2 tablets by mouth twice daily.   ? buPROPion HCL (WELLBUTRIN) 75 mg tablet Take one tablet by mouth twice daily. Indications: major depressive disorder   ? BYSTOLIC 5 mg tablet TAKE 1 TABLET BY MOUTH EVERY DAY   ? cholecalciferol (VITAMIN D-3) 1,000 units tablet Take 1,000 Units by mouth twice weekly.   ? clonazePAM (KLONOPIN) 0.5 mg tablet Take 1.5 tablets by mouth twice daily. (Patient taking differently: Take 0.75 mg by mouth daily.)   ? clonazePAM (KLONOPIN) 1 mg tablet Take one tablet by mouth as Needed (For driving). (Patient taking differently: Take 1 mg by mouth daily with dinner.)   ? cyanocobalamin (vitamin B-12) 2,500 mcg tab Take 1 tablet by mouth daily with breakfast.   ? diphenhydrAMINE (BENADRYL ALLERGY) 12.5 mg/5 mL oral solution Take 12.5 mg by mouth every 6 hours as needed.   ? divalproex (DEPAKOTE EC) 250 mg DR tablet Take three tablets by mouth daily. Take with food.   ? famotidine (PEPCID) 20 mg tablet Take 20 mg by mouth daily as needed.   ? multivit, Ca, min-FA-soy isofl 400-60 mcg-mg tab Take 1 tablet by mouth twice daily.   ? mupirocin (CENTANY) 2 % topical ointment Apply  topically to affected area twice daily.   ? nystatin (NYSTOP) 100,000 unit/g topical powder Apply one g topically to affected area four times daily.   ? nystatin-triamcinolone 100,000 unit/g / 0.1 % topical cream Apply  topically to affected area twice daily as needed. Apply sparingly to area twice a day for 5 days followed by once a day for 5 days.   ? ondansetron (ZOFRAN ODT) 4  mg rapid dissolve tablet Dissolve one tablet by mouth every 8 hours as needed. Place on tongue to disolve.   ? pantoprazole DR (PROTONIX) 40 mg tablet TAKE 1 TABLET BY MOUTH EVERY DAY (Patient taking differently: Take 40 mg by mouth as Needed.)   ? Potassium 99 mg tab Take 1 tablet by mouth three times weekly.   ? prochlorperazine maleate (COMPAZINE) 10 mg tablet Take one tablet by mouth every 6 hours as needed for Nausea or Vomiting.   ? rosuvastatin (CRESTOR) 20 mg tablet TAKE 1 TABLET BY MOUTH EVERY DAY   ? scopolamine (TRANSDERM-SCOP) 1.5 mg 3 day patch Apply one patch to top of skin as directed every 72 hours. (Patient taking differently: Apply 1 patch to top of skin as directed as Needed.)   ? sertraline (ZOLOFT) 100 mg tablet Take one-half tablet by mouth daily. Take with 25 mg for total 75 mg dose.   ? sertraline (ZOLOFT) 25 mg tablet Take one tablet by mouth daily. For a total of 75mg  daily.  Indications: anxiousness associated with depression   ? SUMAtriptan succinate (IMITREX) 50 mg tablet Take one tablet by mouth at onset of headache. May repeat after 2 hours if needed. Max of 200 mg in 24 hours.   ? verapamil (CALAN) 40 mg tablet TAKE 1/2 TABLET BY MOUTH IN THE MORNING AND TAKE 1 TABLET IN THE EVENING   ? vitamins, multi w/minerals 9 mg iron-400 mcg tab Take 1 tablet by mouth daily with breakfast.     Vitals:    11/28/20 1429   BP: 122/78   BP Source: Arm, Left Upper   Patient Position: Sitting   Pulse: 65   Temp: 36.5 ?C (97.7 ?F)   TempSrc: Skin   Weight: 97.1 kg (214 lb)   Height: 165.1 cm (5' 5)   PainSc: Two     Body mass index is 35.61 kg/m?Marland Kitchen     Physical Exam  Vitals reviewed.   HENT:      Head: Normocephalic.   Cardiovascular:      Rate and Rhythm: Normal rate.   Pulmonary:      Effort: Pulmonary effort is normal.   Chest:          Comments: Bilateral breast reduction with wise pattern incisions. No open wounds to left breast, incisions well healed. She does have wound vac over wound to right IMF, however, I am unable to assess wound as her vac was just placed last night and she would like to leave the vac on    Neurological:      Mental Status: She is alert and oriented to person, place, and time.              Assessment and Plan:    Christina Garrison is doing well. I am hopeful we are finally seeing wound healing along her incisions. She does have the wound vac to the right IMF wound. I am unable to assess the wound, as Christina Garrison would like to keep the vac on since it was just placed less than 24 hours ago. In discussion with Christina Garrison, we will continue with the plan of keeping her wound vac on until the home health nurse removes it this Friday (04.01.22). Her home health nurse will then follow up next week to reassess her wound. She will come back and see me in 2 weeks      - continue with home health and wound care as described  - follow up two weeks  Total time spent 20 minutes, of which greater than 50% was spent on counseling of care       Problem List Items Addressed This Visit        Christina Garrison    Hypertrophy of breast - Primary    Christina Garrison       MUSCULOSKELETAL AND INJURIES    Wound healing, delayed    Wound dehiscence, surgical

## 2020-11-29 ENCOUNTER — Encounter: Admit: 2020-11-29 | Discharge: 2020-11-29 | Payer: PRIVATE HEALTH INSURANCE

## 2020-11-30 ENCOUNTER — Encounter: Admit: 2020-11-30 | Discharge: 2020-11-30 | Payer: PRIVATE HEALTH INSURANCE

## 2020-11-30 NOTE — Telephone Encounter
Left voicemail about needing her to call us back about upcoming appointment. Dr. Keenan Bachelor wants to see patient and see if she can come in at 3pm.

## 2020-12-05 ENCOUNTER — Encounter: Admit: 2020-12-05 | Discharge: 2020-12-05 | Payer: PRIVATE HEALTH INSURANCE

## 2020-12-05 NOTE — Progress Notes
FMLA Paper work was successfully faxed to [phone]

## 2020-12-11 IMAGING — CR CHEST
2 series · 2 of 2 positions shown · non-contrast
Comparison: none

[chest pa x-wise]
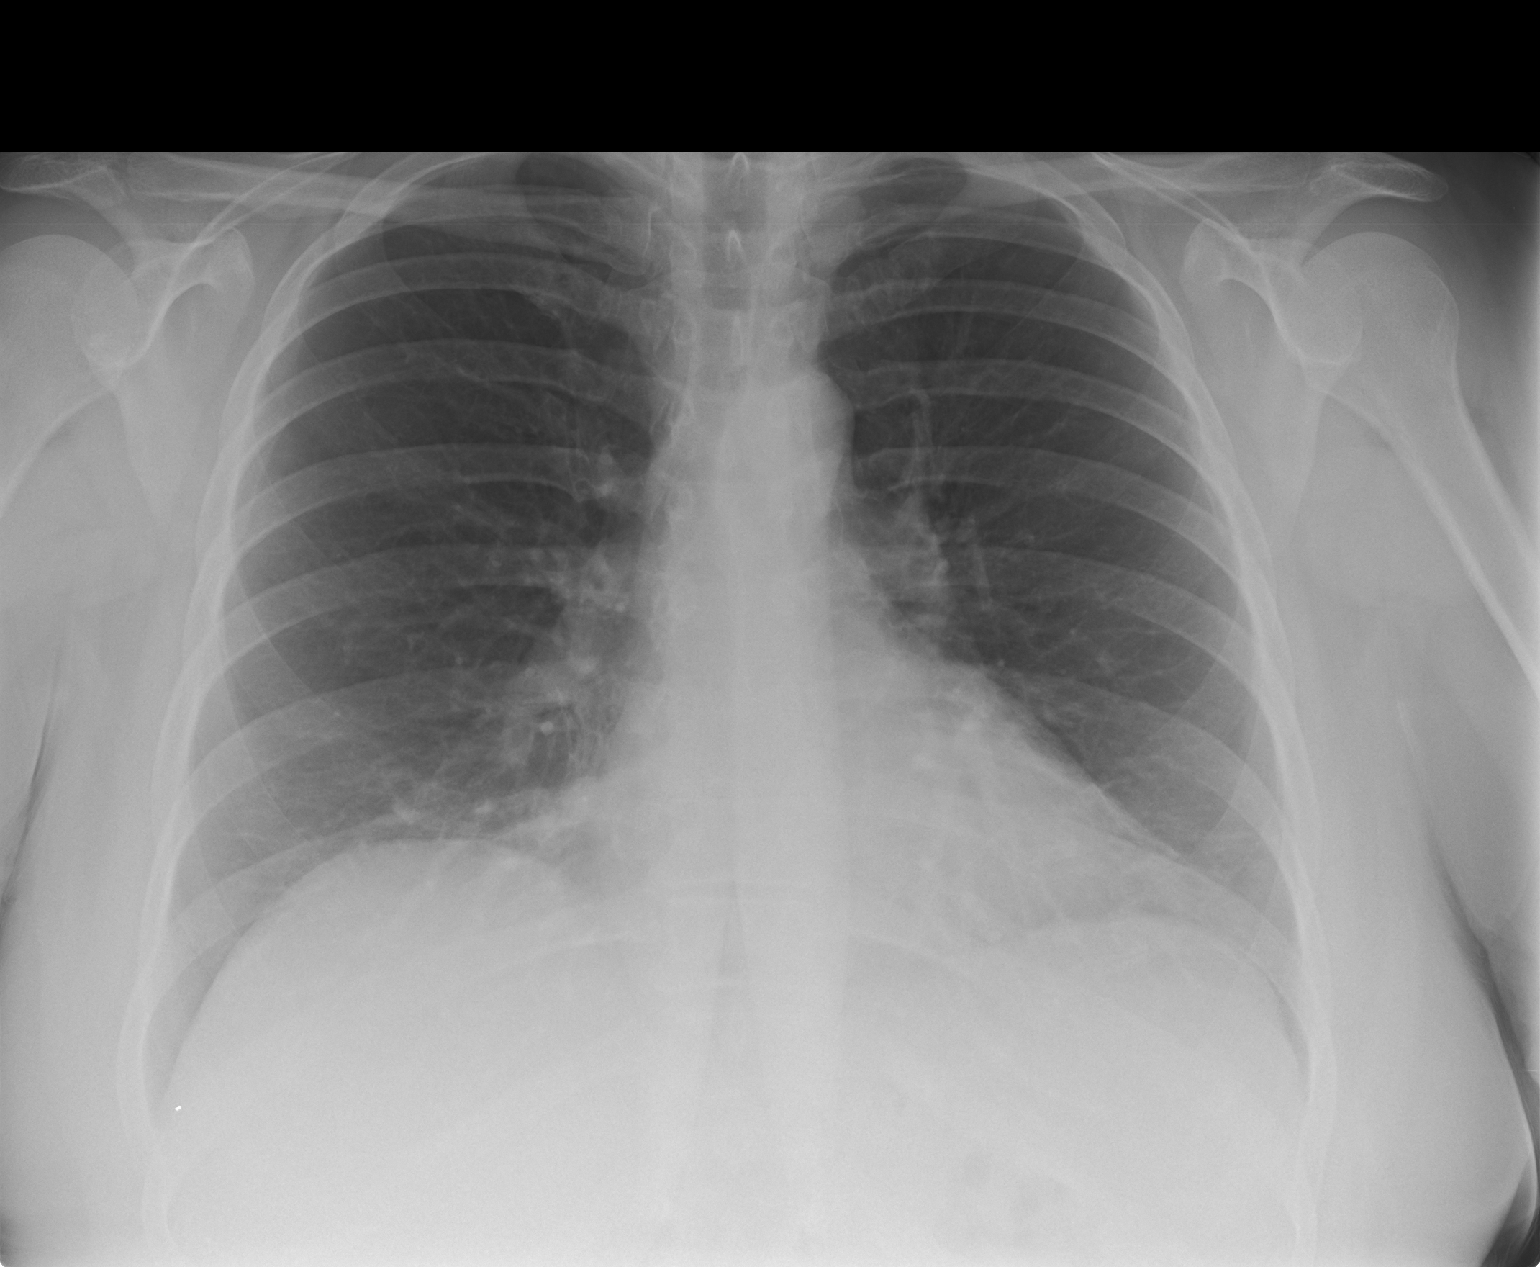

[chest lat]
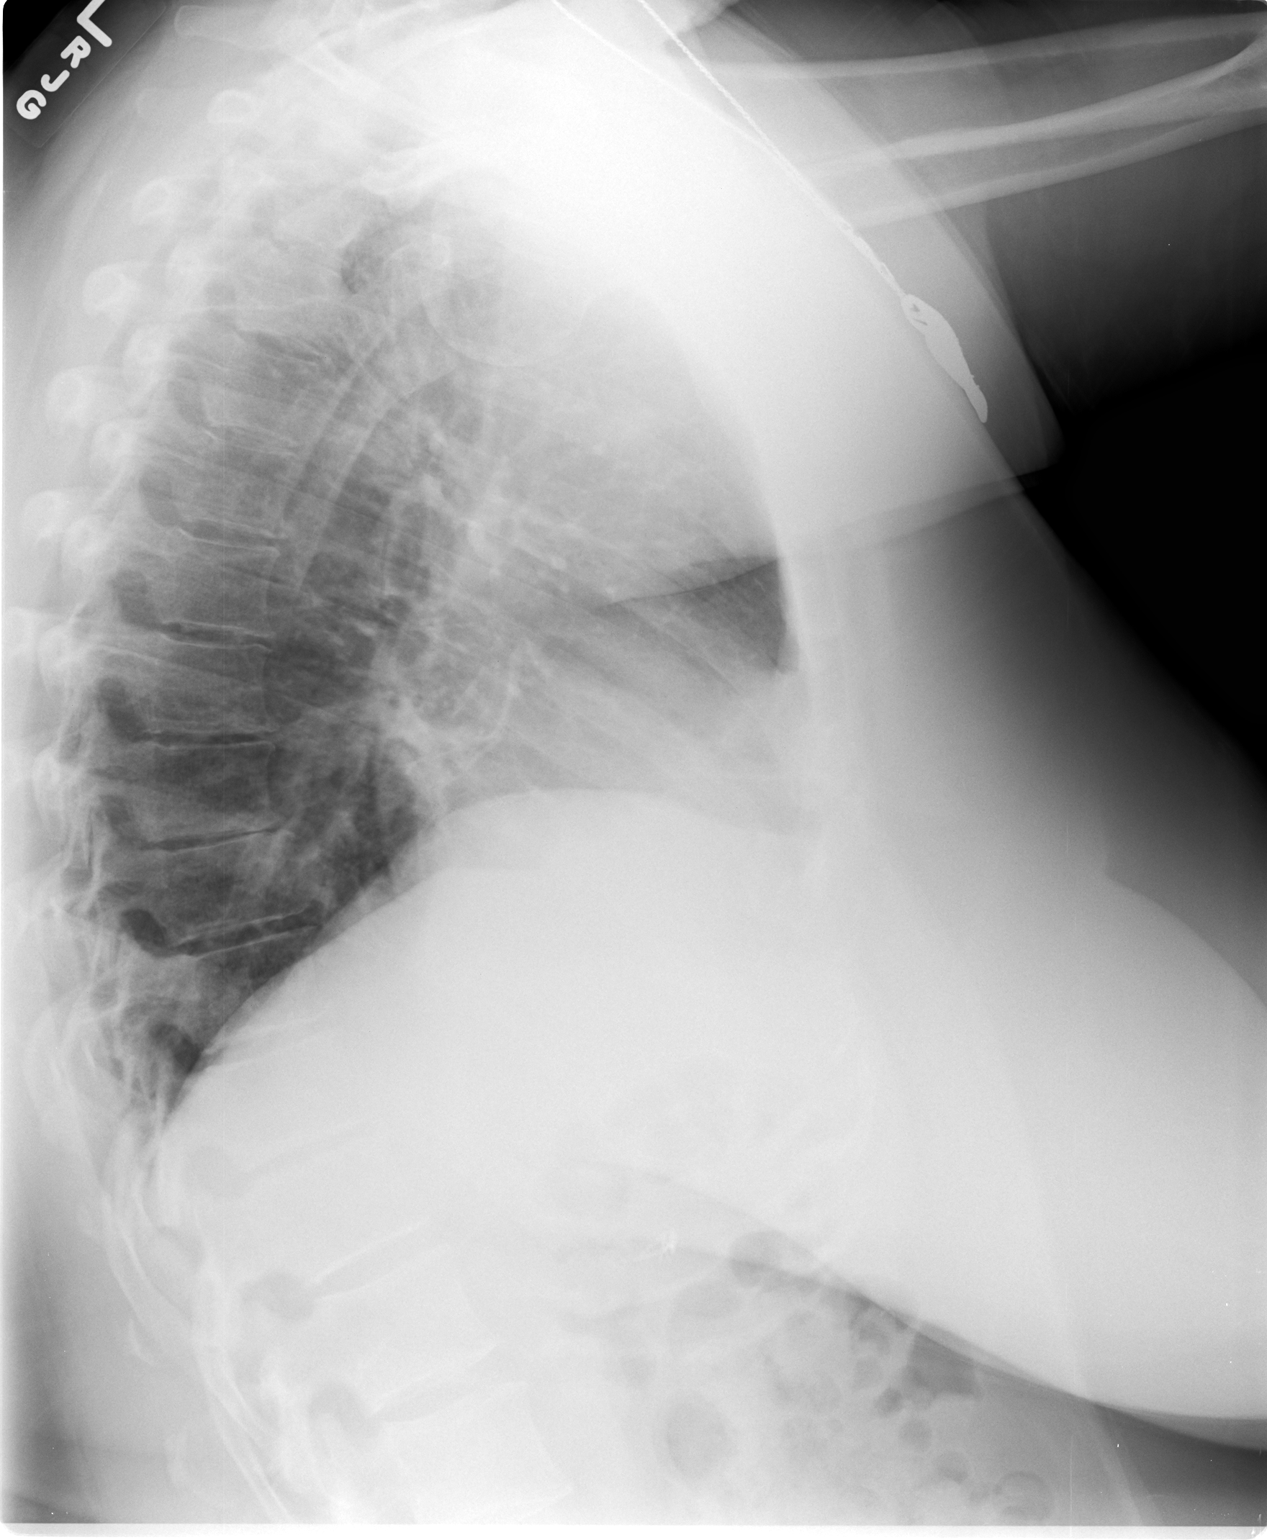

[2 of 2 positions shown; findings below may reference images not displayed]

Ordering:    NOLLET, FAMILLY

DIAGNOSTIC STUDIES

EXAM
Chest x-ray

INDICATION
mid epigastric pain, cough, recent EGD.

TECHNIQUE
PA and lateral views of the chest were obtained.

COMPARISONS
None available

FINDINGS
Cardiac silhouette is within limits. No acute infiltrates are seen. Bony thorax is unremarkable.

IMPRESSION
No evidence for active disease the chest.

Tech Notes:

mid epigastric pain, cough, recent EGD.

## 2020-12-12 ENCOUNTER — Encounter: Admit: 2020-12-12 | Discharge: 2020-12-12 | Payer: PRIVATE HEALTH INSURANCE

## 2020-12-12 ENCOUNTER — Ambulatory Visit: Admit: 2020-12-12 | Discharge: 2020-12-13 | Payer: Commercial Managed Care - HMO

## 2020-12-12 DIAGNOSIS — G5601 Carpal tunnel syndrome, right upper limb: Secondary | ICD-10-CM

## 2020-12-12 DIAGNOSIS — J449 Chronic obstructive pulmonary disease, unspecified: Secondary | ICD-10-CM

## 2020-12-12 DIAGNOSIS — I10 Essential (primary) hypertension: Secondary | ICD-10-CM

## 2020-12-12 DIAGNOSIS — Z720 Tobacco use: Secondary | ICD-10-CM

## 2020-12-12 DIAGNOSIS — F99 Mental disorder, not otherwise specified: Secondary | ICD-10-CM

## 2020-12-12 DIAGNOSIS — J302 Other seasonal allergic rhinitis: Secondary | ICD-10-CM

## 2020-12-12 DIAGNOSIS — G43909 Migraine, unspecified, not intractable, without status migrainosus: Secondary | ICD-10-CM

## 2020-12-12 DIAGNOSIS — N62 Hypertrophy of breast: Secondary | ICD-10-CM

## 2020-12-12 DIAGNOSIS — N3010 Interstitial cystitis (chronic) without hematuria: Secondary | ICD-10-CM

## 2020-12-12 DIAGNOSIS — E7800 Pure hypercholesterolemia, unspecified: Secondary | ICD-10-CM

## 2020-12-12 DIAGNOSIS — G4733 Obstructive sleep apnea (adult) (pediatric): Secondary | ICD-10-CM

## 2020-12-12 DIAGNOSIS — G44039 Episodic paroxysmal hemicrania, not intractable: Secondary | ICD-10-CM

## 2020-12-12 DIAGNOSIS — R42 Dizziness and giddiness: Secondary | ICD-10-CM

## 2020-12-12 DIAGNOSIS — R519 Generalized headaches: Secondary | ICD-10-CM

## 2020-12-12 DIAGNOSIS — R0600 Dyspnea, unspecified: Secondary | ICD-10-CM

## 2020-12-12 DIAGNOSIS — T753XXA Motion sickness, initial encounter: Secondary | ICD-10-CM

## 2020-12-12 DIAGNOSIS — M1990 Unspecified osteoarthritis, unspecified site: Secondary | ICD-10-CM

## 2020-12-12 DIAGNOSIS — T8131XD Disruption of external operation (surgical) wound, not elsewhere classified, subsequent encounter: Secondary | ICD-10-CM

## 2020-12-12 DIAGNOSIS — M549 Dorsalgia, unspecified: Secondary | ICD-10-CM

## 2020-12-12 DIAGNOSIS — T148XXD Other injury of unspecified body region, subsequent encounter: Secondary | ICD-10-CM

## 2020-12-12 DIAGNOSIS — K859 Acute pancreatitis without necrosis or infection, unspecified: Secondary | ICD-10-CM

## 2020-12-12 NOTE — Progress Notes
Subjective:       History of Present Illness  Christina Garrison is a 50 y.o. female    Here today for wound follow up  Christina Garrison is doing well. Her wound vac has been off for 2 weeks. She is using Aquaphor to bilateral breasts. She has no open wounds and reports incisions have healed.                   Review of Systems   Constitutional: Negative.    HENT: Negative.    Eyes: Negative.    Respiratory: Negative.    Cardiovascular: Negative.    Gastrointestinal: Negative.    Endocrine: Negative.    Genitourinary: Negative.    Musculoskeletal: Negative.    Skin: Negative.    Allergic/Immunologic: Negative.    Neurological: Positive for headaches.   Hematological: Negative.    Psychiatric/Behavioral: Negative.    All other systems reviewed and are negative.        Objective:         ? biotin 5,000 mcg TbDi Dissolve 2 tablets by mouth twice daily.   ? buPROPion HCL (WELLBUTRIN) 75 mg tablet Take one tablet by mouth twice daily. Indications: major depressive disorder   ? BYSTOLIC 5 mg tablet TAKE 1 TABLET BY MOUTH EVERY DAY   ? cholecalciferol (VITAMIN D-3) 1,000 units tablet Take 1,000 Units by mouth twice weekly.   ? clonazePAM (KLONOPIN) 0.5 mg tablet Take 1.5 tablets by mouth twice daily. (Patient taking differently: Take 0.75 mg by mouth daily.)   ? clonazePAM (KLONOPIN) 1 mg tablet Take one tablet by mouth as Needed (For driving). (Patient taking differently: Take 1 mg by mouth daily with dinner.)   ? cyanocobalamin (vitamin B-12) 2,500 mcg tab Take 1 tablet by mouth daily with breakfast.   ? diphenhydrAMINE (BENADRYL ALLERGY) 12.5 mg/5 mL oral solution Take 12.5 mg by mouth every 6 hours as needed.   ? divalproex (DEPAKOTE EC) 250 mg DR tablet Take three tablets by mouth daily. Take with food.   ? famotidine (PEPCID) 20 mg tablet Take 20 mg by mouth daily as needed.   ? multivit, Ca, min-FA-soy isofl 400-60 mcg-mg tab Take 1 tablet by mouth twice daily.   ? mupirocin (CENTANY) 2 % topical ointment Apply  topically to affected area twice daily.   ? nystatin (NYSTOP) 100,000 unit/g topical powder Apply one g topically to affected area four times daily.   ? nystatin-triamcinolone 100,000 unit/g / 0.1 % topical cream Apply  topically to affected area twice daily as needed. Apply sparingly to area twice a day for 5 days followed by once a day for 5 days.   ? ondansetron (ZOFRAN ODT) 4 mg rapid dissolve tablet Dissolve one tablet by mouth every 8 hours as needed. Place on tongue to disolve.   ? pantoprazole DR (PROTONIX) 40 mg tablet TAKE 1 TABLET BY MOUTH EVERY DAY (Patient taking differently: Take 40 mg by mouth as Needed.)   ? Potassium 99 mg tab Take 1 tablet by mouth three times weekly.   ? prochlorperazine maleate (COMPAZINE) 10 mg tablet Take one tablet by mouth every 6 hours as needed for Nausea or Vomiting.   ? rosuvastatin (CRESTOR) 20 mg tablet TAKE 1 TABLET BY MOUTH EVERY DAY   ? scopolamine (TRANSDERM-SCOP) 1.5 mg 3 day patch Apply one patch to top of skin as directed every 72 hours. (Patient taking differently: Apply 1 patch to top of skin as directed as Needed.)   ?  sertraline (ZOLOFT) 100 mg tablet Take one-half tablet by mouth daily. Take with 25 mg for total 75 mg dose.   ? sertraline (ZOLOFT) 25 mg tablet Take one tablet by mouth daily. For a total of 75mg  daily.  Indications: anxiousness associated with depression   ? SUMAtriptan succinate (IMITREX) 50 mg tablet Take one tablet by mouth at onset of headache. May repeat after 2 hours if needed. Max of 200 mg in 24 hours.   ? verapamil (CALAN) 40 mg tablet TAKE 1/2 TABLET BY MOUTH IN THE MORNING AND TAKE 1 TABLET IN THE EVENING   ? vitamins, multi w/minerals 9 mg iron-400 mcg tab Take 1 tablet by mouth daily with breakfast.     Vitals:    12/12/20 1430   BP: 123/76   BP Source: Arm, Left Upper   Pulse: 70   Temp: 36.3 ?C (97.4 ?F)   TempSrc: Skin   PainSc: Zero   Weight: 97.1 kg (214 lb)   Height: 165.1 cm (5' 5)     Body mass index is 35.61 kg/m?Marland Kitchen     Physical Exam  Vitals reviewed.   HENT:      Head: Normocephalic.   Cardiovascular:      Rate and Rhythm: Normal rate.   Pulmonary:      Effort: Pulmonary effort is normal.   Chest:          Comments: Bilateral breast reduction with wise pattern incisions. Incisions are clean, dry and intact. No open wounds. Incisions well healed. No s/s infection or fluid collections. Breasts soft    Neurological:      Mental Status: She is alert and oriented to person, place, and time.              Assessment and Plan:  Christina Garrison is doing well. Her bilateral breasts incisions are now healed. I would continue using aquaphor to keep skin moisturized. I did discuss with her scar therapy and spf to scars    - I do recommend using silicone based products to scars (such as silicone sheeting or ointment such as biocorneum). Also use spf to scars   - follow up with Christina Croy APRN-NP in 8 weeks, will take photos then          Total time spent 15 minutes, of which greater than 50% was spent on counseling of care       Problem List Items Addressed This Visit        GENITOURINARY AND REPRODUCTIVE    Hypertrophy of breast - Primary       MUSCULOSKELETAL AND INJURIES    Wound healing, delayed    Wound dehiscence, surgical

## 2020-12-15 ENCOUNTER — Encounter: Admit: 2020-12-15 | Discharge: 2020-12-15 | Payer: PRIVATE HEALTH INSURANCE

## 2020-12-15 MED ORDER — BUPROPION HCL 75 MG PO TAB
75 mg | ORAL_TABLET | Freq: Two times a day (BID) | ORAL | 2 refills
Start: 2020-12-15 — End: ?

## 2020-12-23 ENCOUNTER — Encounter: Admit: 2020-12-23 | Discharge: 2020-12-23 | Payer: PRIVATE HEALTH INSURANCE

## 2020-12-24 ENCOUNTER — Encounter: Admit: 2020-12-24 | Discharge: 2020-12-24 | Payer: PRIVATE HEALTH INSURANCE

## 2020-12-24 MED ORDER — CLONAZEPAM 0.5 MG PO TAB
.75 mg | ORAL_TABLET | Freq: Two times a day (BID) | ORAL | 5 refills
Start: 2020-12-24 — End: ?

## 2020-12-24 MED ORDER — CLONAZEPAM 1 MG PO TAB
1 mg | ORAL_TABLET | ORAL | 5 refills | PRN
Start: 2020-12-24 — End: ?

## 2020-12-25 ENCOUNTER — Encounter: Admit: 2020-12-25 | Discharge: 2020-12-25 | Payer: PRIVATE HEALTH INSURANCE

## 2020-12-25 NOTE — Telephone Encounter
I had faxed all signed home health orders back on Friday last week.  I called Cecilie Lowers [facility] and left message due to same paperwork arrived today. Sarah phoned back.  She did receive all copies.  She also said pt phoned this am saying her father was not doing well. She needed to skip this week's [place] visit.  Pt told Judson Roch, RN that she remains healed. HH will see pt next on 5/4.

## 2021-01-09 ENCOUNTER — Encounter: Admit: 2021-01-09 | Discharge: 2021-01-09 | Payer: PRIVATE HEALTH INSURANCE

## 2021-01-11 ENCOUNTER — Encounter: Admit: 2021-01-11 | Discharge: 2021-01-11 | Payer: PRIVATE HEALTH INSURANCE

## 2021-01-20 ENCOUNTER — Encounter: Admit: 2021-01-20 | Discharge: 2021-01-20 | Payer: PRIVATE HEALTH INSURANCE

## 2021-01-20 DIAGNOSIS — I10 Essential (primary) hypertension: Secondary | ICD-10-CM

## 2021-01-20 MED ORDER — NEBIVOLOL 5 MG PO TAB
ORAL_TABLET | Freq: Every day | 1 refills
Start: 2021-01-20 — End: ?
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
## 2021-01-24 ENCOUNTER — Encounter: Admit: 2021-01-24 | Discharge: 2021-01-24 | Payer: PRIVATE HEALTH INSURANCE

## 2021-01-24 ENCOUNTER — Ambulatory Visit: Admit: 2021-01-24 | Discharge: 2021-01-25 | Payer: Commercial Managed Care - HMO

## 2021-01-24 DIAGNOSIS — F341 Dysthymic disorder: Secondary | ICD-10-CM

## 2021-01-24 DIAGNOSIS — F418 Other specified anxiety disorders: Secondary | ICD-10-CM

## 2021-01-24 DIAGNOSIS — G4733 Obstructive sleep apnea (adult) (pediatric): Secondary | ICD-10-CM

## 2021-01-24 DIAGNOSIS — F4310 Post-traumatic stress disorder, unspecified: Secondary | ICD-10-CM

## 2021-01-24 MED ORDER — SERTRALINE 25 MG PO TAB
25 mg | ORAL_TABLET | Freq: Every day | ORAL | 3 refills | Status: AC
Start: 2021-01-24 — End: ?

## 2021-01-24 NOTE — Progress Notes
ATTENDING NOTE  I discussed Christina Garrison with Era Skeen, MD and concur with the assessment and treatment plan. Patient is 50 y.o. female with MDD, GAD and PTSD. Pt reports improvement in symptoms  fatigue and depression. Denies SI/HI and AVH and no other safety concerns. Pt reports no medication side effects.    PLAN:  The following medications will be continued for the above symptoms:  1. Continue Klonopin 1.5mg  PO BID and 1mg  PO Daily PRN  2. Continue Zoloft 75mg  PO Daily  3. Continue Depakote 250mg  PO BID  4. Continue Wellbutrin IR 75mg  PO BID   5. Recommend Psychotherapy  6. No labs needed    ? biotin 5,000 mcg TbDi Dissolve 2 tablets by mouth twice daily.   ? buPROPion HCL (WELLBUTRIN) 75 mg tablet TAKE ONE TABLET BY MOUTH TWICE DAILY. INDICATIONS: MAJOR DEPRESSIVE DISORDER   ? cholecalciferol (VITAMIN D-3) 1,000 units tablet Take 1,000 Units by mouth twice weekly.   ? clonazePAM (KLONOPIN) 0.5 mg tablet Take 1.5 tablets by mouth twice daily.   ? clonazePAM (KLONOPIN) 1 mg tablet Take one tablet by mouth as Needed (For driving).   ? cyanocobalamin (vitamin B-12) 2,500 mcg tab Take 1 tablet by mouth daily with breakfast.   ? diphenhydrAMINE (BENADRYL ALLERGY) 12.5 mg/5 mL oral solution Take 12.5 mg by mouth every 6 hours as needed.   ? divalproex (DEPAKOTE EC) 250 mg DR tablet Take three tablets by mouth daily. Take with food.   ? famotidine (PEPCID) 20 mg tablet Take 20 mg by mouth daily as needed.   ? multivit, Ca, min-FA-soy isofl 400-60 mcg-mg tab Take 1 tablet by mouth twice daily.   ? mupirocin (CENTANY) 2 % topical ointment Apply  topically to affected area twice daily.   ? nebivoloL (BYSTOLIC) 5 mg tablet TAKE 1 TABLET BY MOUTH EVERY DAY   ? nystatin (NYSTOP) 100,000 unit/g topical powder Apply one g topically to affected area four times daily.   ? nystatin-triamcinolone 100,000 unit/g / 0.1 % topical cream Apply  topically to affected area twice daily as needed. Apply sparingly to area twice a day for 5 days followed by once a day for 5 days.   ? ondansetron (ZOFRAN ODT) 4 mg rapid dissolve tablet Dissolve one tablet by mouth every 8 hours as needed. Place on tongue to disolve.   ? pantoprazole DR (PROTONIX) 40 mg tablet TAKE 1 TABLET BY MOUTH EVERY DAY (Patient taking differently: Take 40 mg by mouth as Needed.)   ? Potassium 99 mg tab Take 1 tablet by mouth three times weekly.   ? prochlorperazine maleate (COMPAZINE) 10 mg tablet Take one tablet by mouth every 6 hours as needed for Nausea or Vomiting.   ? rosuvastatin (CRESTOR) 20 mg tablet TAKE 1 TABLET BY MOUTH EVERY DAY   ? scopolamine (TRANSDERM-SCOP) 1.5 mg 3 day patch Apply one patch to top of skin as directed every 72 hours. (Patient taking differently: Apply 1 patch to top of skin as directed as Needed.)   ? sertraline (ZOLOFT) 100 mg tablet Take one-half tablet by mouth daily. Take with 25 mg for total 75 mg dose.   ? sertraline (ZOLOFT) 25 mg tablet Take one tablet by mouth daily. For a total of 75mg  daily.  Indications: anxiousness associated with depression   ? SUMAtriptan succinate (IMITREX) 50 mg tablet Take one tablet by mouth at onset of headache. May repeat after 2 hours if needed. Max of 200 mg in 24 hours.   ?  verapamil (CALAN) 40 mg tablet TAKE 1/2 TABLET BY MOUTH IN THE MORNING AND TAKE 1 TABLET IN THE EVENING   ? vitamins, multi w/minerals 9 mg iron-400 mcg tab Take 1 tablet by mouth daily with breakfast.       Rae Mar, MD  01/24/2021

## 2021-02-08 ENCOUNTER — Encounter: Admit: 2021-02-08 | Discharge: 2021-02-08 | Payer: PRIVATE HEALTH INSURANCE

## 2021-02-14 ENCOUNTER — Encounter: Admit: 2021-02-14 | Discharge: 2021-02-14 | Payer: PRIVATE HEALTH INSURANCE

## 2021-02-19 ENCOUNTER — Encounter: Admit: 2021-02-19 | Discharge: 2021-02-19 | Payer: PRIVATE HEALTH INSURANCE

## 2021-02-19 DIAGNOSIS — F418 Other specified anxiety disorders: Secondary | ICD-10-CM

## 2021-02-19 DIAGNOSIS — F341 Dysthymic disorder: Secondary | ICD-10-CM

## 2021-02-19 DIAGNOSIS — F4310 Post-traumatic stress disorder, unspecified: Secondary | ICD-10-CM

## 2021-02-19 MED ORDER — SERTRALINE 50 MG PO TAB
50 mg | ORAL_TABLET | Freq: Every day | ORAL | 3 refills | Status: AC
Start: 2021-02-19 — End: ?

## 2021-02-19 MED ORDER — SERTRALINE 25 MG PO TAB
25 mg | ORAL_TABLET | Freq: Every day | ORAL | 3 refills | Status: AC
Start: 2021-02-19 — End: ?

## 2021-03-10 ENCOUNTER — Encounter: Admit: 2021-03-10 | Discharge: 2021-03-10 | Payer: PRIVATE HEALTH INSURANCE

## 2021-03-20 ENCOUNTER — Encounter: Admit: 2021-03-20 | Discharge: 2021-03-20 | Payer: PRIVATE HEALTH INSURANCE

## 2021-03-20 ENCOUNTER — Ambulatory Visit: Admit: 2021-03-20 | Discharge: 2021-03-21 | Payer: Commercial Managed Care - HMO

## 2021-03-20 DIAGNOSIS — G5601 Carpal tunnel syndrome, right upper limb: Secondary | ICD-10-CM

## 2021-03-20 DIAGNOSIS — J449 Chronic obstructive pulmonary disease, unspecified: Secondary | ICD-10-CM

## 2021-03-20 DIAGNOSIS — G4733 Obstructive sleep apnea (adult) (pediatric): Secondary | ICD-10-CM

## 2021-03-20 DIAGNOSIS — M1990 Unspecified osteoarthritis, unspecified site: Secondary | ICD-10-CM

## 2021-03-20 DIAGNOSIS — E7800 Pure hypercholesterolemia, unspecified: Secondary | ICD-10-CM

## 2021-03-20 DIAGNOSIS — R519 Generalized headaches: Secondary | ICD-10-CM

## 2021-03-20 DIAGNOSIS — K859 Acute pancreatitis without necrosis or infection, unspecified: Secondary | ICD-10-CM

## 2021-03-20 DIAGNOSIS — N62 Hypertrophy of breast: Secondary | ICD-10-CM

## 2021-03-20 DIAGNOSIS — G44039 Episodic paroxysmal hemicrania, not intractable: Secondary | ICD-10-CM

## 2021-03-20 DIAGNOSIS — G43909 Migraine, unspecified, not intractable, without status migrainosus: Secondary | ICD-10-CM

## 2021-03-20 DIAGNOSIS — M549 Dorsalgia, unspecified: Secondary | ICD-10-CM

## 2021-03-20 DIAGNOSIS — Z411 Encounter for cosmetic surgery: Secondary | ICD-10-CM

## 2021-03-20 DIAGNOSIS — J302 Other seasonal allergic rhinitis: Secondary | ICD-10-CM

## 2021-03-20 DIAGNOSIS — T753XXA Motion sickness, initial encounter: Secondary | ICD-10-CM

## 2021-03-20 DIAGNOSIS — R42 Dizziness and giddiness: Secondary | ICD-10-CM

## 2021-03-20 DIAGNOSIS — R0600 Dyspnea, unspecified: Secondary | ICD-10-CM

## 2021-03-20 DIAGNOSIS — Z720 Tobacco use: Secondary | ICD-10-CM

## 2021-03-20 DIAGNOSIS — T148XXD Other injury of unspecified body region, subsequent encounter: Secondary | ICD-10-CM

## 2021-03-20 DIAGNOSIS — I10 Essential (primary) hypertension: Secondary | ICD-10-CM

## 2021-03-20 DIAGNOSIS — E65 Localized adiposity: Secondary | ICD-10-CM

## 2021-03-20 DIAGNOSIS — N3010 Interstitial cystitis (chronic) without hematuria: Secondary | ICD-10-CM

## 2021-03-20 DIAGNOSIS — F99 Mental disorder, not otherwise specified: Secondary | ICD-10-CM

## 2021-03-20 NOTE — Progress Notes
Subjective:       History of Present Illness  Christina Garrison is a 50 y.o. female.  S/p bilateral breast reduction. 07/23/20. And liposuction abdomen/flanks.  She has gained some weight recently with issues going on in her life.     She reports she does not have sensation to her breasts/chest wall.   Has some sensation returning to her abdomen.     She wants to know if anything can be done to improve her body contour.     She also reports feeling more fullness on the right abdomen.  And also has pain on the right lateral back that extends across her flank.      Review of Systems   Constitutional: Negative.    HENT: Negative.    Eyes: Negative.    Respiratory: Negative.    Cardiovascular: Negative.    Gastrointestinal: Negative.    Endocrine: Negative.    Genitourinary: Negative.    Musculoskeletal: Positive for arthralgias.   Skin: Negative.    Allergic/Immunologic: Negative.    Neurological: Positive for headaches.   Hematological: Negative.    Psychiatric/Behavioral: The patient is nervous/anxious.          Objective:         ? biotin 5,000 mcg TbDi Dissolve 2 tablets by mouth twice daily.   ? buPROPion HCL (WELLBUTRIN) 75 mg tablet TAKE ONE TABLET BY MOUTH TWICE DAILY. INDICATIONS: MAJOR DEPRESSIVE DISORDER   ? cholecalciferol (VITAMIN D-3) 1,000 units tablet Take 1,000 Units by mouth twice weekly.   ? clonazePAM (KLONOPIN) 0.5 mg tablet Take 1.5 tablets by mouth twice daily.   ? clonazePAM (KLONOPIN) 1 mg tablet Take one tablet by mouth as Needed (For driving).   ? cyanocobalamin (vitamin B-12) 2,500 mcg tab Take 1 tablet by mouth daily with breakfast.   ? diphenhydrAMINE HCL (BENADRYL) 12.5 mg/5 mL oral solution Take 12.5 mg by mouth every 6 hours as needed.   ? divalproex (DEPAKOTE EC) 250 mg DR tablet Take three tablets by mouth daily. Take with food.   ? famotidine (PEPCID) 20 mg tablet Take 20 mg by mouth daily as needed.   ? multivit, Ca, min-FA-soy isofl 400-60 mcg-mg tab Take 1 tablet by mouth twice daily.   ? mupirocin (CENTANY) 2 % topical ointment Apply  topically to affected area twice daily.   ? nebivoloL (BYSTOLIC) 5 mg tablet TAKE 1 TABLET BY MOUTH EVERY DAY   ? nystatin-triamcinolone 100,000 unit/g / 0.1 % topical cream Apply  topically to affected area twice daily as needed. Apply sparingly to area twice a day for 5 days followed by once a day for 5 days.   ? ondansetron (ZOFRAN ODT) 4 mg rapid dissolve tablet Dissolve one tablet by mouth every 8 hours as needed. Place on tongue to disolve.   ? pantoprazole DR (PROTONIX) 40 mg tablet TAKE 1 TABLET BY MOUTH EVERY DAY (Patient taking differently: Take 40 mg by mouth as Needed.)   ? Potassium 99 mg tab Take 1 tablet by mouth three times weekly.   ? prochlorperazine maleate (COMPAZINE) 10 mg tablet Take one tablet by mouth every 6 hours as needed for Nausea or Vomiting.   ? rosuvastatin (CRESTOR) 20 mg tablet TAKE 1 TABLET BY MOUTH EVERY DAY   ? sertraline (ZOLOFT) 25 mg tablet Take one tablet by mouth daily. Please take with 50mg  tablets for total daily dose of 75mg    ? sertraline (ZOLOFT) 25 mg tablet Take one tablet by mouth daily. For  a total of 75mg  daily.  Indications: anxiousness associated with depression   ? sertraline (ZOLOFT) 50 mg tablet Take one tablet by mouth daily. Please take with 25mg  tablet for total daily dose of 75mg    ? SUMAtriptan succinate (IMITREX) 50 mg tablet Take one tablet by mouth at onset of headache. May repeat after 2 hours if needed. Max of 200 mg in 24 hours.   ? verapamil (CALAN) 40 mg tablet TAKE 1/2 TABLET BY MOUTH IN THE MORNING AND TAKE 1 TABLET IN THE EVENING   ? vitamins, multi w/minerals 9 mg iron-400 mcg tab Take 1 tablet by mouth daily with breakfast.     Vitals:    03/20/21 1452   BP: 119/84   Pulse: 73   Temp: 36.6 ?C (97.9 ?F)   TempSrc: Skin   PainSc: Five   Weight: 108 kg (238 lb)   Height: 165.1 cm (5' 5)     Body mass index is 39.61 kg/m?Marland Kitchen     Physical Exam  Vitals reviewed.   Constitutional: Appearance: She is obese.   HENT:      Head: Normocephalic.   Cardiovascular:      Rate and Rhythm: Normal rate.   Pulmonary:      Effort: Pulmonary effort is normal.   Abdominal:      General: Abdomen is flat.      Comments: Intra-abdominal adiposity.   Extra-abdominal adiposity is mild to moderate.  Some skin excess in the lower abdomen.   Musculoskeletal:      Comments: No areas of subcutaneous scar tissue palpable, abdomen and flanks.    Posterior flanks/upper buttocks with excess adiposity   Skin:     General: Skin is warm and dry.      Comments: Well-healed Wise pattern incisions bilateral breast   Neurological:      Mental Status: She is alert and oriented to person, place, and time.   Psychiatric:         Mood and Affect: Mood normal.         Thought Content: Thought content normal.              Assessment and Plan:    We discussed that she needs to continue to work on weight loss.  She did not want to specifically review her weight but she does need to lose weight down to her post operative weight.    Some of her issues.  Benefit with an abdominoplasty.  However since she has had such difficulty with wound healing on the breast reduction, it is not wise to pursue abdominoplasty.  The risks of wound healing problems are still there.    Regarding her diminished sensation, it appears that her nerves do not recover very well from surgery.  She is followed by neurology but for her migraines.  Perhaps this is something that her neurologist could explore.    Regarding her flank pain, this is likely musculoskeletal as there is no palpable abnormality.  She has had chronic back pain and nerve ablation.  Therefore her etiology could be musculoskeletal.    Regarding her fullness in her right abdomen, this is localized adiposity by exam.  She would first need to lose weight before evaluating the contour.    If anything her contour and trunk and feeling of fullness, I could do liposuction to her posterior flanks/lower back.  She did recover from her liposuction well.  There was minimal healing involved.  So it is reasonable to pursue liposuction  which can be done in the office under local.    Photos taken today.  Will email her a price quote for surgery.    I inquired about her bills since it was an issue after her last surgery.  She says that she has not been getting any bills since then.    She will be seeing Dr. Jen Mow for a right hand problem.  She has been having frequent falls and has exacerbated an injury to her right hand.  We would delay liposuction surgery until she can comfortably don and doff her dressings.      Problem List Items Addressed This Visit     Hypertrophy of breast - Primary    Encounter for cosmetic surgery    Localized adiposity    Wound healing, delayed                         Posterior flanks/lower back liposuction.  In the office.  2 hours.

## 2021-03-25 ENCOUNTER — Encounter: Admit: 2021-03-25 | Discharge: 2021-03-25 | Payer: PRIVATE HEALTH INSURANCE

## 2021-03-25 ENCOUNTER — Ambulatory Visit: Admit: 2021-03-25 | Discharge: 2021-03-26 | Payer: Commercial Managed Care - HMO

## 2021-03-25 DIAGNOSIS — Z720 Tobacco use: Secondary | ICD-10-CM

## 2021-03-25 DIAGNOSIS — J449 Chronic obstructive pulmonary disease, unspecified: Secondary | ICD-10-CM

## 2021-03-25 DIAGNOSIS — G4733 Obstructive sleep apnea (adult) (pediatric): Secondary | ICD-10-CM

## 2021-03-25 DIAGNOSIS — M65341 Trigger finger, right ring finger: Secondary | ICD-10-CM

## 2021-03-25 DIAGNOSIS — G44039 Episodic paroxysmal hemicrania, not intractable: Secondary | ICD-10-CM

## 2021-03-25 DIAGNOSIS — E7800 Pure hypercholesterolemia, unspecified: Secondary | ICD-10-CM

## 2021-03-25 DIAGNOSIS — J302 Other seasonal allergic rhinitis: Secondary | ICD-10-CM

## 2021-03-25 DIAGNOSIS — M1990 Unspecified osteoarthritis, unspecified site: Secondary | ICD-10-CM

## 2021-03-25 DIAGNOSIS — G5621 Lesion of ulnar nerve, right upper limb: Secondary | ICD-10-CM

## 2021-03-25 DIAGNOSIS — M7701 Medial epicondylitis, right elbow: Secondary | ICD-10-CM

## 2021-03-25 DIAGNOSIS — F99 Mental disorder, not otherwise specified: Secondary | ICD-10-CM

## 2021-03-25 DIAGNOSIS — R42 Dizziness and giddiness: Secondary | ICD-10-CM

## 2021-03-25 DIAGNOSIS — T753XXA Motion sickness, initial encounter: Secondary | ICD-10-CM

## 2021-03-25 DIAGNOSIS — G43909 Migraine, unspecified, not intractable, without status migrainosus: Secondary | ICD-10-CM

## 2021-03-25 DIAGNOSIS — K859 Acute pancreatitis without necrosis or infection, unspecified: Secondary | ICD-10-CM

## 2021-03-25 DIAGNOSIS — G5601 Carpal tunnel syndrome, right upper limb: Secondary | ICD-10-CM

## 2021-03-25 DIAGNOSIS — R519 Generalized headaches: Secondary | ICD-10-CM

## 2021-03-25 DIAGNOSIS — M549 Dorsalgia, unspecified: Secondary | ICD-10-CM

## 2021-03-25 DIAGNOSIS — N3010 Interstitial cystitis (chronic) without hematuria: Secondary | ICD-10-CM

## 2021-03-25 DIAGNOSIS — I10 Essential (primary) hypertension: Secondary | ICD-10-CM

## 2021-03-25 DIAGNOSIS — M65351 Trigger finger, right little finger: Secondary | ICD-10-CM

## 2021-03-25 DIAGNOSIS — M65331 Trigger finger, right middle finger: Secondary | ICD-10-CM

## 2021-03-25 DIAGNOSIS — R0600 Dyspnea, unspecified: Secondary | ICD-10-CM

## 2021-03-25 NOTE — Progress Notes
[facility] has an office in [place], [place] that hand therapy order can be sent to. "eval and treat with gentle AROM following right ulnar nerve transposition at the elbow, Z-plasty lengthening of the right flexor/pronator muscle mass, decompression of the right ulnar nerve at the wrist through Guyon's canal, right carpal tunnel release, right middle finger trigger release, right ring finger trigger release, right small finger trigger release

## 2021-03-25 NOTE — Progress Notes
Subjective:       This is a very pleasant 50 year old woman who is right-hand dominant.  She has been having numbness/tingling/pain in her right upper extremity for quite some time.  She had an endoscopic carpal tunnel release done that year and a half ago by Dr. Clemens Catholic.  She never had substantial relief of her numbness and tingling.  It is been getting worse and she now has significant numbness and tingling that radiates essentially from her medial epicondyle of her elbow down to her small and ring fingers as well as the continued numbness and tingling in her other fingers.  She is also been having active triggering of her small ring and middle fingers.  She is here discuss treatment options.    Christina Garrison is a 50 y.o. female.  No chief complaint on file.           Review of Systems   Review of Systems   Constitutional: Negative.    HENT: Negative.    Eyes: Negative.    Respiratory: Negative.    Cardiovascular: Negative.    Gastrointestinal: Negative.    Genitourinary: Negative.    Musculoskeletal: Positive for joint pain.   Skin: Negative.    Neurological: Positive for tingling and sensory change.   Endo/Heme/Allergies: Negative.    Medical History:   Diagnosis Date   ? Arthritis    ? Back pain    ? Carpal tunnel syndrome on right 12/27/2019   ? COPD (chronic obstructive pulmonary disease) (HCC)    ? Dyspnea     per pt-post op 2015.  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 Generalized headaches    ? High cholesterol    ? Hypertension 11/21/2010   ? Interstitial cystitis    ? Migraines    ? Motion sickness    ? OSA (obstructive sleep apnea)     uses NC w CA   ? Pancreatitis 11/21/2010   ? 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 Psychiatric illness     Bipolar; Anxiety; Depression   ? Seasonal allergic reaction    ? Tobacco abuse    ? Vertigo      Surgical History:   Procedure Laterality Date   ? SINUS SURGERY  2004   ? PR LAPS GSTRC RSTRICTIV PX LONGITUDINAL GASTRECTOMY 03/2014    per pt code blue the day after-I couldn't breath- I had fluid on my lungs ([place])   ? EGD N/A 08/23/2018    Performed by Vertell Novak, MD at [facility] OR   ? COLONOSCOPY DIAGNOSTIC WITH SPECIMEN COLLECTION BY BRUSHING/ WASHING - FLEXIBLE N/A 08/23/2018    Performed by Vertell Novak, MD at [facility] OR   ? OPEN REPAIR SUPRAUMBILICAL HERNIA WITH MESH N/A 09/23/2018    Performed by Lelan Pons., MD at [facility] OR   ? 24HR pH w/ Impedance -OFF PPIs N/A 01/27/2019    Performed by Tommie Sams, MD at [facility] ENDO   ? ESOPHAGEAL MOTILITY STUDY N/A 01/27/2019    Performed by Tommie Sams, MD at [facility] ENDO   ? ENDOSCOPIC RELEASE OF TRANSVERSE CARPAL LIGAMENT- WRIST Left 10/21/2019    Performed by Marylene Land, MD at [facility] OR   ? ENDOSCOPIC RELEASE OF TRANSVERSE CARPAL LIGAMENT- WRIST Right 12/27/2019    Performed by Marylene Land, MD at [facility] OR   ? BREAST REDUCTION Bilateral 07/23/2020   ? LIPOSUCTION  07/23/2020   ?  MAMMAPLASTY REDUCTION (INSURANCE) Bilateral 07/23/2020    Performed by Erlene Quan, Zachary George, MD at [facility] OR   ? LATERAL CHEST WALL LIPOSUCTION  (COSMETIC) LIPOSUCTION TO ABDOMEN AND FLANKS Bilateral 07/23/2020    Performed by Stevenson Clinch, MD at [facility] OR   ? CARPAL TUNNEL RELEASE Bilateral 10/2019-12/2019   ? ELECTROCARDIOGRAM     ? HX APPENDECTOMY     ? HX CHOLECYSTECTOMY     ? HX PARTIAL HYSTERECTOMY       Family History   Problem Relation Age of Onset   ? Coronary Artery Disease Mother    ? Allergy-severe Mother    ? Diabetes Mother    ? Heart Attack Mother    ? High Cholesterol Mother    ? Hypertension Mother    ? Asthma Mother    ? Coronary Artery Disease Father    ? Heart Attack Father    ? High Cholesterol Father    ? Hypertension Father    ? Brain Tumor Father    ? Seizures Father    ? Hypertension Brother    ? Diabetes Sister    ? High Cholesterol Sister    ? Hypertension Sister    ? Diabetes Brother    ? Tumor Sister    ? Cancer Sister 67        Brain, deceased   ? Tumor Sister 50 2 in Brain   ? Heart Disease Sister 16     Social History     Socioeconomic History   ? Marital status: Married   Tobacco Use   ? Smoking status: Former Smoker     Packs/day: 1.50     Years: 30.00     Pack years: 45.00     Types: Cigarettes     Quit date: 05/02/2012     Years since quitting: 8.9   ? Smokeless tobacco: Never Used   ? Tobacco comment: Currently Vapes   Vaping Use   ? Vaping Use: Former   ? Quit date: 05/14/2020   ? Substances: Nicotine   Substance and Sexual Activity   ? Alcohol use: No     Alcohol/week: 0.0 standard drinks   ? Drug use: No   ? Sexual activity: Not Currently     Birth control/protection: Surgical           Objective:         ? biotin 5,000 mcg TbDi Dissolve 2 tablets by mouth twice daily.   ? buPROPion HCL (WELLBUTRIN) 75 mg tablet TAKE ONE TABLET BY MOUTH TWICE DAILY. INDICATIONS: MAJOR DEPRESSIVE DISORDER   ? cholecalciferol (VITAMIN D-3) 1,000 units tablet Take 1,000 Units by mouth twice weekly.   ? clonazePAM (KLONOPIN) 0.5 mg tablet Take 1.5 tablets by mouth twice daily.   ? clonazePAM (KLONOPIN) 1 mg tablet Take one tablet by mouth as Needed (For driving).   ? cyanocobalamin (vitamin B-12) 2,500 mcg tab Take 1 tablet by mouth daily with breakfast.   ? diphenhydrAMINE HCL (BENADRYL) 12.5 mg/5 mL oral solution Take 12.5 mg by mouth every 6 hours as needed.   ? divalproex (DEPAKOTE EC) 250 mg DR tablet Take three tablets by mouth daily. Take with food.   ? famotidine (PEPCID) 20 mg tablet Take 20 mg by mouth daily as needed.   ? multivit, Ca, min-FA-soy isofl 400-60 mcg-mg tab Take 1 tablet by mouth twice daily.   ? mupirocin (CENTANY) 2 % topical ointment Apply  topically to affected area twice  daily.   ? nebivoloL (BYSTOLIC) 5 mg tablet TAKE 1 TABLET BY MOUTH EVERY DAY   ? nystatin-triamcinolone 100,000 unit/g / 0.1 % topical cream Apply  topically to affected area twice daily as needed. Apply sparingly to area twice a day for 5 days followed by once a day for 5 days.   ? ondansetron (ZOFRAN ODT) 4 mg rapid dissolve tablet Dissolve one tablet by mouth every 8 hours as needed. Place on tongue to disolve.   ? pantoprazole DR (PROTONIX) 40 mg tablet TAKE 1 TABLET BY MOUTH EVERY DAY (Patient taking differently: Take 40 mg by mouth as Needed.)   ? Potassium 99 mg tab Take 1 tablet by mouth three times weekly.   ? prochlorperazine maleate (COMPAZINE) 10 mg tablet Take one tablet by mouth every 6 hours as needed for Nausea or Vomiting.   ? rosuvastatin (CRESTOR) 20 mg tablet TAKE 1 TABLET BY MOUTH EVERY DAY   ? sertraline (ZOLOFT) 25 mg tablet Take one tablet by mouth daily. Please take with 50mg  tablets for total daily dose of 75mg    ? sertraline (ZOLOFT) 25 mg tablet Take one tablet by mouth daily. For a total of 75mg  daily.  Indications: anxiousness associated with depression   ? sertraline (ZOLOFT) 50 mg tablet Take one tablet by mouth daily. Please take with 25mg  tablet for total daily dose of 75mg    ? SUMAtriptan succinate (IMITREX) 50 mg tablet Take one tablet by mouth at onset of headache. May repeat after 2 hours if needed. Max of 200 mg in 24 hours.   ? verapamil (CALAN) 40 mg tablet TAKE 1/2 TABLET BY MOUTH IN THE MORNING AND TAKE 1 TABLET IN THE EVENING   ? vitamins, multi w/minerals 9 mg iron-400 mcg tab Take 1 tablet by mouth daily with breakfast.     Vitals:    03/25/21 1425   BP: (!) 144/86   Pulse: 62   Temp: 36.7 ?C (98 ?F)   Resp: 16   TempSrc: Oral   PainSc: Six   Weight: 108 kg (238 lb)   Height: 165.1 cm (5' 5)     Body mass index is 39.61 kg/m?Marland Kitchen     Physical Exam  Ortho Exam   Physical Exam   Constitutional: Patient is oriented to person, place, and time. Patient appears well-developed and well-nourished.   HENT:   Head: Normocephalic and atraumatic.   Eyes: EOM are normal. Pupils are equal, round, and reactive to light.   Neck: Normal range of motion.   Cardiovascular: Normal rate, regular rhythm, normal heart sounds and intact distal pulses.    Pulmonary/Chest: Effort normal and breath sounds normal.   Neurological: Patient is alert and oriented to person, place, and time.   Skin: Skin is warm and dry. Capillary refill takes less than 2 seconds.   Psychiatric: Patient has a normal mood and affect. Behavior is normal. Judgment and thought content normal.     Right hand:  Positive Tinel's over the carpal tunnel, positive compression test at the carpal tunnel, positive Tinel's over Guyon's canal, negative pronator exam, positive Tinel's over cubital tunnel, positive compression/Durkan's at cubital tunnel, decreased sensation over dorsal/ulnar hand, negative radial tunnel exam, significant tenderness over medial epicondyle, no tenderness over the lateral epicondyle, stable DRUJ, negative Watson's maneuver, negative Finkelstein's, negative CMC grind  Opposition intact, ulnar intrinsics functioning.  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  The risks of bleeding, infection, scarring, pain, stiffness, nerve injury, need for further procedures were described.  Her questions were answered.  Given the amount of surgery she is going to have I told her that she would need to start working in hand therapy 3 or 4 days after surgery to keep her motion going.  We will help her make arrangements for this as well.  We will schedule at her convenience.      right ulnar nerve transposition at the elbow, Z-plasty lengthening of the flexor/pronator muscle mass, decompression of the ulnar nerve at the wrist through Guyon's canal, carpal tunnel release, middle finger trigger release, ring finger trigger release, small finger trigger release  Regional anesthesia  Supine  Outpatient  90 minutes  [id number], [id number], [id number], [id number]  1 week follow-up  Therapy 2 or 3 days after surgery for early active range of motion

## 2021-03-27 ENCOUNTER — Ambulatory Visit: Admit: 2021-03-27 | Discharge: 2021-03-27 | Payer: Commercial Managed Care - HMO

## 2021-03-27 ENCOUNTER — Encounter: Admit: 2021-03-27 | Discharge: 2021-03-27 | Payer: PRIVATE HEALTH INSURANCE

## 2021-03-27 DIAGNOSIS — J449 Chronic obstructive pulmonary disease, unspecified: Secondary | ICD-10-CM

## 2021-03-27 DIAGNOSIS — J302 Other seasonal allergic rhinitis: Secondary | ICD-10-CM

## 2021-03-27 DIAGNOSIS — Z720 Tobacco use: Secondary | ICD-10-CM

## 2021-03-27 DIAGNOSIS — R519 Generalized headaches: Secondary | ICD-10-CM

## 2021-03-27 DIAGNOSIS — G5601 Carpal tunnel syndrome, right upper limb: Secondary | ICD-10-CM

## 2021-03-27 DIAGNOSIS — M549 Dorsalgia, unspecified: Secondary | ICD-10-CM

## 2021-03-27 DIAGNOSIS — I10 Essential (primary) hypertension: Secondary | ICD-10-CM

## 2021-03-27 DIAGNOSIS — R42 Dizziness and giddiness: Secondary | ICD-10-CM

## 2021-03-27 DIAGNOSIS — K859 Acute pancreatitis without necrosis or infection, unspecified: Secondary | ICD-10-CM

## 2021-03-27 DIAGNOSIS — R0600 Dyspnea, unspecified: Secondary | ICD-10-CM

## 2021-03-27 DIAGNOSIS — G44039 Episodic paroxysmal hemicrania, not intractable: Secondary | ICD-10-CM

## 2021-03-27 DIAGNOSIS — F99 Mental disorder, not otherwise specified: Secondary | ICD-10-CM

## 2021-03-27 DIAGNOSIS — N3010 Interstitial cystitis (chronic) without hematuria: Secondary | ICD-10-CM

## 2021-03-27 DIAGNOSIS — M1990 Unspecified osteoarthritis, unspecified site: Secondary | ICD-10-CM

## 2021-03-27 DIAGNOSIS — G4733 Obstructive sleep apnea (adult) (pediatric): Secondary | ICD-10-CM

## 2021-03-27 DIAGNOSIS — T753XXA Motion sickness, initial encounter: Secondary | ICD-10-CM

## 2021-03-27 DIAGNOSIS — E7800 Pure hypercholesterolemia, unspecified: Secondary | ICD-10-CM

## 2021-03-27 DIAGNOSIS — G43909 Migraine, unspecified, not intractable, without status migrainosus: Secondary | ICD-10-CM

## 2021-03-27 NOTE — Pre-Anesthesia Medication Instructions
YOUR MEDICATION LIST     biotin 5,000 mcg TbDi Dissolve 2 tablets by mouth twice daily.    buPROPion HCL (WELLBUTRIN) 75 mg tablet TAKE ONE TABLET BY MOUTH TWICE DAILY. INDICATIONS: MAJOR DEPRESSIVE DISORDER (Patient taking differently: Take 75 mg by mouth daily. Indications: major depressive disorder)    cholecalciferol (VITAMIN D-3) 1,000 units tablet Take 1,000 Units by mouth twice weekly.    clonazePAM (KLONOPIN) 0.5 mg tablet Take 1.5 tablets by mouth twice daily. (Patient taking differently: Take 1 mg by mouth daily.)    clonazePAM (KLONOPIN) 1 mg tablet Take one tablet by mouth as Needed (For driving).    cyanocobalamin (vitamin B-12) 2,500 mcg tab Take 1 tablet by mouth daily with breakfast.    diphenhydrAMINE HCL (BENADRYL) 12.5 mg/5 mL oral solution Take 12.5 mg by mouth every 6 hours as needed.    divalproex (DEPAKOTE EC) 250 mg DR tablet Take three tablets by mouth daily. Take with food.    famotidine (PEPCID) 20 mg tablet Take 20 mg by mouth daily as needed.    multivit, Ca, min-FA-soy isofl 400-60 mcg-mg tab Take 1 tablet by mouth twice daily.    mupirocin (CENTANY) 2 % topical ointment Apply  topically to affected area twice daily.    nebivoloL (BYSTOLIC) 5 mg tablet TAKE 1 TABLET BY MOUTH EVERY DAY    nystatin-triamcinolone 100,000 unit/g / 0.1 % topical cream Apply  topically to affected area twice daily as needed. Apply sparingly to area twice a day for 5 days followed by once a day for 5 days.    ondansetron (ZOFRAN ODT) 4 mg rapid dissolve tablet Dissolve one tablet by mouth every 8 hours as needed. Place on tongue to disolve.    pantoprazole DR (PROTONIX) 40 mg tablet TAKE 1 TABLET BY MOUTH EVERY DAY (Patient taking differently: Take 40 mg by mouth as Needed.)    Potassium 99 mg tab Take 1 tablet by mouth three times weekly.    prochlorperazine maleate (COMPAZINE) 10 mg tablet Take one tablet by mouth every 6 hours as needed for Nausea or Vomiting.    rosuvastatin (CRESTOR) 20 mg tablet TAKE 1 TABLET BY MOUTH EVERY DAY (Patient taking differently: at bedtime daily.)    sertraline (ZOLOFT) 25 mg tablet Take one tablet by mouth daily. Please take with 50mg  tablets for total daily dose of 75mg     sertraline (ZOLOFT) 25 mg tablet Take one tablet by mouth daily. For a total of 75mg  daily.  Indications: anxiousness associated with depression    sertraline (ZOLOFT) 50 mg tablet Take one tablet by mouth daily. Please take with 25mg  tablet for total daily dose of 75mg     SUMAtriptan succinate (IMITREX) 50 mg tablet Take one tablet by mouth at onset of headache. May repeat after 2 hours if needed. Max of 200 mg in 24 hours.    verapamil (CALAN) 40 mg tablet TAKE 1/2 TABLET BY MOUTH IN THE MORNING AND TAKE 1 TABLET IN THE EVENING (Patient taking differently: Take 40 mg by mouth at bedtime daily. TAKE 1/2 TABLET BY MOUTH IN THE MORNING AND TAKE 1 TABLET IN THE EVENING)    vitamins, multi w/minerals 9 mg iron-400 mcg tab Take 1 tablet by mouth daily with breakfast.         YOUR  MEDICATION INSTRUCTIONS FOR SURGERY    Please continue taking your medications as your doctor has told you to UNLESS it is listed to stop below.    14 DAYS BEFORE SURGERY  Do  not take the following vitamins, herbals, and supplements:  MULTIVITAMIN, BIOTIN  Do not  start any new vitamins, herbals, or natural supplements before surgery.      7 DAYS BEFORE SURGERY  DO NOT TAKE the following anti-inflammatory medications such as ibuprofen (Advil, Motrin) and naproxen (Aleve)   You may use acetaminophen (Tylenol)      DAY BEFORE SURGERY  TAKE your medications the day before surgery as usual unless told differently.      MORNING OF SURGERY  DO NOT take these medications:  Remaining vitamins/supplements  Ointments/creams/lotions  IMITREX, VITAMIN B OR D, POTASSIUM     TAKE these medications with a sip (1-2 ounces) of water:  KLONOPIN, WELLBUTRIN, DEPAKOTE, BYSTOLIC, PROTONIX, ZOLOFT, CALAN  Use all inhalers, nasal sprays, and eye drops as usual  May take if needed:  BENADRYL, PEPCID, ZOFRAN OR COMPAZINE     Please contact the clinic pharmacist with any changes to your medication list or medication questions before surgery.  E-mail:  [email or web address]   Phone: [phone]     Before going home from the hospital, please ask your doctor when you should re-start any medicine that was stopped for surgery.

## 2021-03-27 NOTE — Pre-Anesthesia Patient Instructions
GENERAL INFORMATION    Before you come to the hospital  If you are having an outpatient procedure, you will need to arrange for a responsible ride/person to accompany you home due to sedation or anesthesia with your procedure. A responsible person is a person who has the ability to identify a change in the patient's status and notify medical personnel.  This is typically a family member or friend.  Public transportation is permitted if you have a responsible person to accompany you.  An [employer] Goad, taxi or other public transportation driver is not considered a responsible person to accompany you home.  Bath/Shower Instructions  Take a bath or shower with antibacterial soap the night before or the morning of your procedure. Use clean towels.  Put on clean clothes after bath or shower.  Avoid using lotion and oils.  If you are having surgery above the waist, wear a shirt that fastens up the front.  Sleep on clean sheets if bath or shower is done the night before procedure.  Leave money, credit cards, jewelry, and any other valuables at home. The [facility] is not responsible for the loss or breakage of personal items.  Remove nail polish, makeup and all jewelry (including piercings) before coming to the hospital.  The morning of your procedure:  brush your teeth and tongue  do not smoke, vape, chew or use any tobacco products  do not shave the area where you will have surgery    What to bring to the hospital  ID/ Insurance Card  Medical Device card  Official documents for legal guardianship   Copy of your Living Will, Advanced Directives, and/or Durable Power of Attorney.  If you have these documents, please bring them to the admissions office on the day of your surgery to be scanned into your records.  Small bag with a few personal belongings  Cases for glasses/hearing aids/contact lens (bring solutions for contacts)  Dress in clean, loose, comfortable clothing     Eating or drinking before surgery  Do not eat or drink anything after 11:00 p.m. the day before your procedure (including gum, mints, candy, or chewing tobacco) OR follow the specific instructions you were given by your Surgeon.  You may have WATER ONLY up to 2 hours before arriving at the hospital.  Follow the instructions you received from your surgeon and your pre-surgery kit.     Other instructions  Notify your surgeon if:  there is a possibility that you are pregnant  you become ill with a cough, fever, sore throat, nausea, vomiting or flu-like symptoms  you have any open wounds/sores that are red, painful, draining, or are new since you last saw the doctor  you need to cancel your procedure    Notify us at [facility]: [phone]  if you need to cancel your procedure  if you are going to be late    Arrival at the hospital  [facility]  [address]  [place], [place] [address]    Park in the [employer], located directly across from the main entrance to the hospital.  Enter through the ground floor main hospital entrance and check in at the Information Desk in the lobby.  They will validate your parking ticket and direct you to the next location.  If you are a woman between the ages of 28 and 44, and have not had a hysterectomy, you will be asked for a urine sample prior to surgery.  Please do not urinate before arriving in the Surgery Waiting Room.  Once there, check in and let the attendant know if you need to provide a sample.    You will receive a call with your surgery arrival time between 2:30pm and 4:30pm the last business day before your procedure.  If you do not receive a call, please call [phone] before 4:30pm or [phone] after 4:30pm.          For the safety of all patients, visitors and staff as we work to contain COVID-19, we must restrict patient visitors.    Current Visitor Policy (11/07/20):    Our current, and ongoing, visitor rules in surgery and procedural areas are:    1 visitor per patient will be allowed to accompany the patient and wait in the Waiting Room  No visitors will be allowed into the pre/post areas     Patients in inpatient and pediatric units, Emergency Department, ambulatory clinics and lab appointments may only have two visitors.     For inpatient stays, patients may have 2 visitors at a time at their bedside. The two visitors can change throughout the day, but no more than two at a time may be bedside.  The policy applies to [facility] [place], [address], [employer] and [place] campuses and clinics.    Exceptions include:  No visitors allowed for patients with active COVID-19 infections.  Children younger than age 74 are once again allowed to visit inpatients.  One visitor allowed in perioperative and procedural waiting rooms; no visitors allowed in pre/post areas unless a patient becomes an overnight boarder.  Two parents/guardians are allowed for surgical or procedural patients younger than 52 years old.  Adult inpatients in semiprivate rooms may have visitors, but visits should be coordinated so only two total visitors are in a room at a time due to space limitations.    Visitors must be free of fever and symptoms to be in our facilities. We ask visitors to follow these guidelines:  Wear a mask at all times, unless under the age of 2, have trouble breathing or are unconscious, incapacitated or otherwise unable to remove the cover without assistance.  Go directly to the nursing station in the unit you are visiting and do not linger in public areas.  Check in at the nursing station before going to the patient's room.  Maintain a physical distance of six feet from all others.  Follow elevator restrictions to four riding at a time - peak times are 6:30-7:30 a.m., noon and 6:30-7:30 p.m.  Be aware cafeteria peak times are 11 a.m. - 1 p.m.  Wash your hands frequently and cover your coughs and sneezes.

## 2021-03-27 NOTE — Progress Notes
[facility] phone triage completed for procedure on 04/11/21. Patient denies changes in medical or functional status. Pt denies chest pain, palpitations or SOB.  Patient states she can walk 1-2 blocks and can climb 1-2 flights of stairs. Patient was advised to stop taking vitamins, herbals and supplements 14 days prior to their procedure and avoid NSAIDS 7 days prior. Patient was advised which medications they may take with sips of water only on the DOS and may continue to have sips until 2 hours prior to check in at admissions. Pre-op instructions completed, including NPO after 2300. Patient denies skin rashes, wounds or flu-like symptoms at this time.  Patient was advised to contact their surgeon if they develop any for further instructions. Patient was advised with the most current visitor policy as well as mask requirements. Patient verbalized understanding of all instructions and will receive a copy via Sac. No further questions at this time.

## 2021-03-28 ENCOUNTER — Encounter: Admit: 2021-03-28 | Discharge: 2021-03-28 | Payer: PRIVATE HEALTH INSURANCE

## 2021-03-28 DIAGNOSIS — G5601 Carpal tunnel syndrome, right upper limb: Secondary | ICD-10-CM

## 2021-03-28 DIAGNOSIS — M65351 Trigger finger, right little finger: Secondary | ICD-10-CM

## 2021-03-28 DIAGNOSIS — M7701 Medial epicondylitis, right elbow: Secondary | ICD-10-CM

## 2021-03-28 DIAGNOSIS — M65341 Trigger finger, right ring finger: Secondary | ICD-10-CM

## 2021-03-28 DIAGNOSIS — G5621 Lesion of ulnar nerve, right upper limb: Secondary | ICD-10-CM

## 2021-03-28 DIAGNOSIS — M65331 Trigger finger, right middle finger: Secondary | ICD-10-CM

## 2021-03-28 NOTE — Progress Notes
Hand Therapy order : "eval and treat with gentle AROM following rightulnar nerve transposition at the elbow, Z-plasty lengthening of the right flexor/pronator muscle mass, decompression of the right ulnar nerve at the wrist through Guyon's canal, right carpal tunnel release,right middle finger trigger release, right ring finger trigger release, right small finger trigger release to begin 2-3 days after surgery "  placed per Dr. Felecia Shelling.     Order faxed to [facility] in [place], [place] at [phone]

## 2021-03-29 ENCOUNTER — Encounter: Admit: 2021-03-29 | Discharge: 2021-03-29 | Payer: PRIVATE HEALTH INSURANCE

## 2021-03-29 NOTE — Telephone Encounter
Left voicemail for patient about quote. Sent quote to patients email [email or web address]

## 2021-04-05 ENCOUNTER — Encounter: Admit: 2021-04-05 | Discharge: 2021-04-05 | Payer: PRIVATE HEALTH INSURANCE

## 2021-04-10 ENCOUNTER — Encounter: Admit: 2021-04-10 | Discharge: 2021-04-10 | Payer: PRIVATE HEALTH INSURANCE

## 2021-04-10 NOTE — Anesthesia Pre-Procedure Evaluation
BMI 39  Anesthesia Pre-Procedure Evaluation    Name: Christina Garrison      MRN: [id number]     DOB: October 12, 1970     Age: 50 y.o.     Sex: female   _________________________________________________________________________     Procedure Info:   Procedure Information     Date/Time: 04/11/21 1005    Procedures:       LENGTHENING/ SHORTENING FLEXOR/ EXTENSOR TENDON FOREARM/ WRIST - SINGLE (Right ) - CASE LENGTH 90 MINUTES      DECOMPRESSION MEDIAN NERVE AT CARPAL TUNNEL (Right )      NEUROPLASTY/ TRANSPOSITION ULNAR NERVE AT ELBOW (Right )      NEUROPLASTY/ TRANSPOSITION ULNAR NERVE AT WRIST (Right )    Location: MAIN OR 10 / Main OR/Periop    Surgeons: Nicky Pugh, MD          Physical Assessment  Vital Signs (last filed in past 24 hours):         Patient History   Allergies   Allergen Reactions   ? Effexor [Venlafaxine] SEE COMMENTS     Suicidal thoughts   ? Strawberry ANAPHYLAXIS and HIVES   ? Diovan [Valsartan] HIVES   ? Prozac [Fluoxetine] NAUSEA AND VOMITING and ANXIETY   ? Silver Sulfadiazine RASH   ? Topamax [Topiramate] SEE COMMENTS     Feels like she is going to pass out and speech messed up   ? Decadron [Dexamethasone] NAUSEA AND VOMITING and SEE COMMENTS     Also causes agitation    ? Gabapentin SEE COMMENTS     She can't remember what the reaction was but doesn't want to take it again.        Current Medications    Medication Directions   biotin 5,000 mcg TbDi Dissolve 2 tablets by mouth twice daily.   buPROPion HCL (WELLBUTRIN) 75 mg tablet TAKE ONE TABLET BY MOUTH TWICE DAILY. INDICATIONS: MAJOR DEPRESSIVE DISORDER  Patient taking differently: Take 75 mg by mouth daily. Indications: major depressive disorder   cholecalciferol (VITAMIN D-3) 1,000 units tablet Take 1,000 Units by mouth twice weekly.   clonazePAM (KLONOPIN) 0.5 mg tablet Take 1.5 tablets by mouth twice daily.  Patient taking differently: Take 1 mg by mouth daily.   clonazePAM (KLONOPIN) 1 mg tablet Take one tablet by mouth as Needed (For driving).   cyanocobalamin (vitamin B-12) 2,500 mcg tab Take 1 tablet by mouth daily with breakfast.   diphenhydrAMINE HCL (BENADRYL) 12.5 mg/5 mL oral solution Take 12.5 mg by mouth every 6 hours as needed.   divalproex (DEPAKOTE EC) 250 mg DR tablet Take three tablets by mouth daily. Take with food.   famotidine (PEPCID) 20 mg tablet Take 20 mg by mouth daily as needed.   multivit, Ca, min-FA-soy isofl 400-60 mcg-mg tab Take 1 tablet by mouth twice daily.   mupirocin (CENTANY) 2 % topical ointment Apply  topically to affected area twice daily.   nebivoloL (BYSTOLIC) 5 mg tablet TAKE 1 TABLET BY MOUTH EVERY DAY   nystatin-triamcinolone 100,000 unit/g / 0.1 % topical cream Apply  topically to affected area twice daily as needed. Apply sparingly to area twice a day for 5 days followed by once a day for 5 days.   ondansetron (ZOFRAN ODT) 4 mg rapid dissolve tablet Dissolve one tablet by mouth every 8 hours as needed. Place on tongue to disolve.   pantoprazole DR (PROTONIX) 40 mg tablet TAKE 1 TABLET BY MOUTH EVERY DAY  Patient taking differently: Take  40 mg by mouth as Needed.   Potassium 99 mg tab Take 1 tablet by mouth three times weekly.   prochlorperazine maleate (COMPAZINE) 10 mg tablet Take one tablet by mouth every 6 hours as needed for Nausea or Vomiting.   rosuvastatin (CRESTOR) 20 mg tablet TAKE 1 TABLET BY MOUTH EVERY DAY  Patient taking differently: at bedtime daily.   sertraline (ZOLOFT) 25 mg tablet Take one tablet by mouth daily. Please take with 50mg  tablets for total daily dose of 75mg    sertraline (ZOLOFT) 25 mg tablet Take one tablet by mouth daily. For a total of 75mg  daily.  Indications: anxiousness associated with depression   sertraline (ZOLOFT) 50 mg tablet Take one tablet by mouth daily. Please take with 25mg  tablet for total daily dose of 75mg    SUMAtriptan succinate (IMITREX) 50 mg tablet Take one tablet by mouth at onset of headache. May repeat after 2 hours if needed. Max of 200 mg in 24 hours.   verapamil (CALAN) 40 mg tablet TAKE 1/2 TABLET BY MOUTH IN THE MORNING AND TAKE 1 TABLET IN THE EVENING  Patient taking differently: Take 40 mg by mouth at bedtime daily. TAKE 1/2 TABLET BY MOUTH IN THE MORNING AND TAKE 1 TABLET IN THE EVENING   vitamins, multi w/minerals 9 mg iron-400 mcg tab Take 1 tablet by mouth daily with breakfast.         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), mild       No recent URI      Shortness of breath        Sleep apnea          Interventions: BiPAP; compliant      Cardiovascular         Exercise tolerance: >4 METS       Beta Blocker therapy: Yes      Beta blockers within 24 hours: Yes        Hypertension, well controlled      No hx of coronary artery disease      No palpitations      No angina      Hyperlipidemia (statin)      No orthopnea      No dyspnea on exertion      GI/Hepatic/Renal           GERD (PPI), well controlled      No hx of liver disease     No renal disease      No electrolyte problems      No nausea      History of pancreatitis      Neuro/Psych       No seizures      No CVA      Headaches      No chronic opioid use      Chronic benzodiazepine use (clonazepam daily)      No indications/hx of sensory deficit  Psychiatric history          Depression          Anxiety      Musculoskeletal         Neck pain      Back pain      No arthritis      Endocrine/Other - negative      No diabetes      No hypothyroidism      No anemia      No autoimmune disease      Obesity    Constitution - negative   Physical Exam    Airway Findings      Mallampati: II      TM distance: >3 FB      Neck ROM: full      Mouth opening: good      Airway patency: adequate    Dental Findings: Negative            Cardiovascular Findings:       Rhythm: regular      Rate: normal      No murmur, no carotid bruit, no peripheral edema    Pulmonary Findings:       Breath sounds clear to auscultation.    Abdominal Findings:       Obese      Abdominal exam deferred    Neurological Findings:       Alert and oriented x 3    Constitutional findings:       No acute distress      Well-developed      Well-nourished       Diagnostic Tests  Hematology:   Lab Results   Component Value Date    HGB 13.8 10/10/2020    HCT 41.3 10/10/2020    PLTCT 277 10/10/2020    WBC 10.9 10/10/2020    NEUT 60 06/09/2019    ANC 7.23 06/09/2019    ALC 3.78 06/09/2019    MONA 7 06/09/2019    AMC 0.79 06/09/2019    EOSA 1 06/09/2019    ABC 0.07 06/09/2019    MCV 98.6 10/10/2020    MCH 32.9 10/10/2020    MCHC 33.3 10/10/2020    MPV 9.1 10/10/2020    RDW 12.6 10/10/2020         General Chemistry:   Lab Results   Component Value Date    NA 141 10/10/2020    K 4.4 10/10/2020    CL 102 10/10/2020    CO2 28 10/10/2020    GAP 11 10/10/2020    BUN 13 10/10/2020    CR 0.61 10/10/2020    GLU 74 10/10/2020    CA 9.2 10/10/2020    ALBUMIN 4.1 10/10/2020    TOTBILI 0.3 10/10/2020      Coagulation: No results found for: PT, PTT, INR      06/07/20 Cardiology pre-op eval.     06/21/20 Echo  The left ventricular size, wall thickness and systolic function are normal. The ejection fraction by Simpson's biplane method is 60%. Normal left ventricular diastolic function.      Cardiac Risk assessment, 06/21/20 Dr. Arna Medici:  Based upon the results of the patient's clinical profile and non-invasive cardiovascular testing, the risk for cardiovascular morbidity and mortality associated with non-cardiac surgery is INTERMEDIATE. This by no means precludes surgery but provides information for the surgeon, anesthesiologist and patient concerning the risks of surgery.  A decision to proceed with surgery should be made based upon the  relative benefits and risks involved. Indeed, breast reduction and liposuction could be a very helpful intervention for this patient's overall health maintenance and could prove very helpful for improvement in her exercise tolerance, functional capacity and cardiovascular stability. There are no absolute contra-indications to elective surgery, as deemed necessary. No additional cardiovascular testing is required at this time. SBG      Anesthesia Plan    ASA score: 3   Plan: general        LAB: CBC, BMP

## 2021-04-11 ENCOUNTER — Encounter: Admit: 2021-04-11 | Discharge: 2021-04-11 | Payer: PRIVATE HEALTH INSURANCE

## 2021-04-11 ENCOUNTER — Ambulatory Visit: Admit: 2021-04-11 | Discharge: 2021-04-11 | Payer: PRIVATE HEALTH INSURANCE

## 2021-04-11 DIAGNOSIS — G44039 Episodic paroxysmal hemicrania, not intractable: Secondary | ICD-10-CM

## 2021-04-11 DIAGNOSIS — I10 Essential (primary) hypertension: Secondary | ICD-10-CM

## 2021-04-11 DIAGNOSIS — F418 Other specified anxiety disorders: Secondary | ICD-10-CM

## 2021-04-11 DIAGNOSIS — E7800 Pure hypercholesterolemia, unspecified: Secondary | ICD-10-CM

## 2021-04-11 DIAGNOSIS — G4733 Obstructive sleep apnea (adult) (pediatric): Secondary | ICD-10-CM

## 2021-04-11 DIAGNOSIS — T753XXA Motion sickness, initial encounter: Secondary | ICD-10-CM

## 2021-04-11 DIAGNOSIS — F341 Dysthymic disorder: Secondary | ICD-10-CM

## 2021-04-11 DIAGNOSIS — G5601 Carpal tunnel syndrome, right upper limb: Secondary | ICD-10-CM

## 2021-04-11 DIAGNOSIS — K859 Acute pancreatitis without necrosis or infection, unspecified: Secondary | ICD-10-CM

## 2021-04-11 DIAGNOSIS — F4310 Post-traumatic stress disorder, unspecified: Secondary | ICD-10-CM

## 2021-04-11 DIAGNOSIS — R42 Dizziness and giddiness: Secondary | ICD-10-CM

## 2021-04-11 DIAGNOSIS — F99 Mental disorder, not otherwise specified: Secondary | ICD-10-CM

## 2021-04-11 DIAGNOSIS — J449 Chronic obstructive pulmonary disease, unspecified: Secondary | ICD-10-CM

## 2021-04-11 DIAGNOSIS — J302 Other seasonal allergic rhinitis: Secondary | ICD-10-CM

## 2021-04-11 DIAGNOSIS — M549 Dorsalgia, unspecified: Secondary | ICD-10-CM

## 2021-04-11 DIAGNOSIS — M1990 Unspecified osteoarthritis, unspecified site: Secondary | ICD-10-CM

## 2021-04-11 DIAGNOSIS — R519 Generalized headaches: Secondary | ICD-10-CM

## 2021-04-11 DIAGNOSIS — G43909 Migraine, unspecified, not intractable, without status migrainosus: Secondary | ICD-10-CM

## 2021-04-11 DIAGNOSIS — N3010 Interstitial cystitis (chronic) without hematuria: Secondary | ICD-10-CM

## 2021-04-11 DIAGNOSIS — Z720 Tobacco use: Secondary | ICD-10-CM

## 2021-04-11 DIAGNOSIS — R0600 Dyspnea, unspecified: Secondary | ICD-10-CM

## 2021-04-11 MED ORDER — ONDANSETRON HCL (PF) 4 MG/2 ML IJ SOLN
INTRAVENOUS | 0 refills | Status: DC
Start: 2021-04-11 — End: 2021-04-11
  Administered 2021-04-11: 14:00:00 4 mg via INTRAVENOUS

## 2021-04-11 MED ORDER — MIDAZOLAM 1 MG/ML IJ SOLN
INTRAVENOUS | 0 refills | Status: DC
Start: 2021-04-11 — End: 2021-04-11
  Administered 2021-04-11: 14:00:00 2 mg via INTRAVENOUS

## 2021-04-11 MED ORDER — CEFAZOLIN 1 GRAM IJ SOLR
INTRAVENOUS | 0 refills | Status: DC
Start: 2021-04-11 — End: 2021-04-11
  Administered 2021-04-11: 15:00:00 2 g via INTRAVENOUS

## 2021-04-11 MED ORDER — DIVALPROEX 250 MG PO TBEC
750 mg | ORAL_TABLET | Freq: Every day | ORAL | 1 refills
Start: 2021-04-11 — End: ?

## 2021-04-11 MED ORDER — SUCCINYLCHOLINE CHLORIDE 20 MG/ML IJ SOLN
INTRAVENOUS | 0 refills | Status: DC
Start: 2021-04-11 — End: 2021-04-11
  Administered 2021-04-11: 14:00:00 140 mg via INTRAVENOUS

## 2021-04-11 MED ORDER — ARTIFICIAL TEARS (PF) SINGLE DOSE DROPS GROUP
OPHTHALMIC | 0 refills | Status: DC
Start: 2021-04-11 — End: 2021-04-11
  Administered 2021-04-11: 14:00:00 2 [drp] via OPHTHALMIC

## 2021-04-11 MED ORDER — HYDROMORPHONE (PF) 2 MG/ML IJ SYRG
INTRAVENOUS | 0 refills | Status: DC
Start: 2021-04-11 — End: 2021-04-11
  Administered 2021-04-11 (×2): .5 mg via INTRAVENOUS

## 2021-04-11 MED ORDER — FAMOTIDINE (PF) 20 MG/2 ML IV SOLN
INTRAVENOUS | 0 refills | Status: DC
Start: 2021-04-11 — End: 2021-04-11
  Administered 2021-04-11: 15:00:00 20 mg via INTRAVENOUS

## 2021-04-11 MED ORDER — PROPOFOL INJ 10 MG/ML IV VIAL
INTRAVENOUS | 0 refills | Status: DC
Start: 2021-04-11 — End: 2021-04-11
  Administered 2021-04-11: 14:00:00 200 mg via INTRAVENOUS

## 2021-04-11 MED ORDER — LIDOCAINE (PF) 200 MG/10 ML (2 %) IJ SYRG
INTRAVENOUS | 0 refills | Status: DC
Start: 2021-04-11 — End: 2021-04-11
  Administered 2021-04-11: 14:00:00 100 mg via INTRAVENOUS

## 2021-04-11 MED ORDER — FENTANYL CITRATE (PF) 50 MCG/ML IJ SOLN
INTRAVENOUS | 0 refills | Status: DC
Start: 2021-04-11 — End: 2021-04-11
  Administered 2021-04-11: 14:00:00 250 ug via INTRAVENOUS

## 2021-04-11 MED ADMIN — BUPIVACAINE-EPINEPHRINE 0.5 %-1:200,000 IJ SOLN [14984]: 30 mL | INTRAMUSCULAR | @ 16:00:00 | Stop: 2021-04-11 | NDC 63323046301

## 2021-04-11 MED ADMIN — TRIAMCINOLONE ACETONIDE 40 MG/ML IJ SUSP [80370]: 40 mg | INTRAMUSCULAR | @ 16:00:00 | Stop: 2021-04-11 | NDC 00003029305

## 2021-04-11 MED ADMIN — FENTANYL CITRATE (PF) 50 MCG/ML IJ SOLN [3037]: 50 ug | INTRAVENOUS | @ 17:00:00 | Stop: 2021-04-11 | NDC 00409909412

## 2021-04-11 MED ADMIN — METOCLOPRAMIDE HCL 5 MG/ML IJ SOLN [5002]: 10 mg | INTRAVENOUS | @ 16:00:00 | Stop: 2021-04-11 | NDC 00409341418

## 2021-04-11 MED ADMIN — FENTANYL CITRATE (PF) 50 MCG/ML IJ SOLN [3037]: 50 ug | INTRAVENOUS | @ 16:00:00 | Stop: 2021-04-11 | NDC 00409909412

## 2021-04-11 MED ADMIN — OXYCODONE 5 MG PO TAB [10814]: 10 mg | ORAL | @ 17:00:00 | Stop: 2021-04-11 | NDC 00904696661

## 2021-04-11 MED ADMIN — LACTATED RINGERS IV SOLP [4318]: 1000 mL | INTRAVENOUS | @ 14:00:00 | Stop: 2021-04-11 | NDC 00338011704

## 2021-04-12 ENCOUNTER — Ambulatory Visit: Admit: 2021-04-12 | Discharge: 2021-04-12 | Payer: Commercial Managed Care - HMO

## 2021-04-12 ENCOUNTER — Encounter: Admit: 2021-04-12 | Discharge: 2021-04-12 | Payer: PRIVATE HEALTH INSURANCE

## 2021-04-12 DIAGNOSIS — R42 Dizziness and giddiness: Secondary | ICD-10-CM

## 2021-04-12 DIAGNOSIS — G43109 Migraine with aura, not intractable, without status migrainosus: Secondary | ICD-10-CM

## 2021-04-12 MED ORDER — CLONAZEPAM 1 MG PO TAB
1 mg | ORAL_TABLET | ORAL | 5 refills | Status: AC | PRN
Start: 2021-04-12 — End: ?

## 2021-04-12 MED ORDER — KETOROLAC 30 MG/ML IJ CRTG
30 mg | Freq: Once | INTRAMUSCULAR | 0 refills | Status: CP
Start: 2021-04-12 — End: ?

## 2021-04-12 MED ORDER — PROCHLORPERAZINE MALEATE 10 MG PO TAB
10 mg | ORAL_TABLET | ORAL | 5 refills | Status: AC | PRN
Start: 2021-04-12 — End: ?

## 2021-04-12 MED ORDER — KETOROLAC 30 MG/ML IJ CRTG
15 mg | Freq: Once | INTRAMUSCULAR | 0 refills | Status: DC
Start: 2021-04-12 — End: 2021-04-12

## 2021-04-12 MED ORDER — CLONAZEPAM 0.5 MG PO TAB
.75 mg | ORAL_TABLET | Freq: Two times a day (BID) | ORAL | 5 refills | Status: AC
Start: 2021-04-12 — End: ?

## 2021-04-12 MED ORDER — RIBOFLAVIN (VITAMIN B2) 400 MG PO TAB
400 mg | ORAL_TABLET | Freq: Every day | ORAL | 3 refills | Status: AC
Start: 2021-04-12 — End: ?

## 2021-04-12 MED ORDER — SUMATRIPTAN SUCCINATE 100 MG PO TAB
ORAL_TABLET | SUBCUTANEOUS | 5 refills | 30.00000 days | Status: AC
Start: 2021-04-12 — End: ?

## 2021-04-12 MED ORDER — MAGNESIUM OXIDE 400 MG (241.3 MG MAGNESIUM) PO TAB
400 mg | ORAL_TABLET | Freq: Every day | ORAL | 3 refills | Status: AC
Start: 2021-04-12 — End: ?

## 2021-04-12 NOTE — Progress Notes
Date of Service: 04/12/2021    Subjective:             Christina Garrison is a 50 y.o. female.    History of Present Illness  Christina Garrison is a 50 y.o. female with a PMH significant for HTN, HLD, vertigo on clonazepam, emphysema, obesity s/p gastric sleeve, OSA on BiPAP, anxiety, PTSD, occipital neuralgia, tension headaches, and migraine, who presents for follow up of her headaches.    She presents with her right arm in a sling today because yesterday she had surgery on the tendons of her forearm.  She is having a headache in the clinic and feels hot and nauseated.      She reports that overall her headaches are better than they were before.  She is only having really bad migraines about 2-3 times per month, however she is having mild or moderate headaches almost every day, and has only about 5 days per month that are completely headache-free.  She increased her depakote to 750 mg as was planned at her last visit.    Her migraines are associated with nausea, vomiting, photophobia, phonophobia, dizziness, and a blurry spot in her vision.  Strong scents and passing cars can trigger a migraine.  They can last for hours and can sometimes carry over into the next day.   She has also had some headaches wake her from sleep, especially when she doesn't user her BiPAP.  When she gets a really bad migraine, she takes tylenol, benadryl, compazine, and 50 mg of sumatriptan.  Most of the time, she is well into her migraine symptoms before she takes this combination of medications.  She has not tried repeating a dose of sumatriptan if the initial combination is not effective.    She continues to have intermittent dizziness, and this is largely unchanged since her last neurology clinic visit.  She is still deriving benefit from her clonazepam therapy, which has helped her to maintain her day to day functions.    Medical History:   Diagnosis Date   ? Arthritis    ? Back pain    ? Carpal tunnel syndrome on right 12/27/2019   ? COPD (chronic obstructive pulmonary disease) (HCC)    ? Dyspnea     per pt-post op 2015.  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 Generalized headaches    ? High cholesterol    ? Hypertension 11/21/2010   ? Interstitial cystitis    ? Migraines    ? Motion sickness    ? OSA (obstructive sleep apnea)     uses NC w CA   ? Pancreatitis 11/21/2010   ? 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 Psychiatric illness     Bipolar; Anxiety; Depression   ? Seasonal allergic reaction    ? Tobacco abuse    ? Vertigo      Surgical History:   Procedure Laterality Date   ? SINUS SURGERY  2004   ? PR LAPS GSTRC RSTRICTIV PX LONGITUDINAL GASTRECTOMY  03/2014    per pt code blue the day after-I couldn't breath- I had fluid on my lungs ([place])   ? EGD N/A 08/23/2018    Performed by Vertell Novak, MD at [facility] OR   ? COLONOSCOPY DIAGNOSTIC WITH SPECIMEN COLLECTION BY BRUSHING/ WASHING - FLEXIBLE N/A 08/23/2018    Performed by Vertell Novak, MD at [facility] OR   ? OPEN REPAIR SUPRAUMBILICAL  HERNIA WITH MESH N/A 09/23/2018    Performed by Lelan Pons., MD at [facility] OR   ? 24HR pH w/ Impedance -OFF PPIs N/A 01/27/2019    Performed by Tommie Sams, MD at [facility] ENDO   ? ESOPHAGEAL MOTILITY STUDY N/A 01/27/2019    Performed by Tommie Sams, MD at [facility] ENDO   ? ENDOSCOPIC RELEASE OF TRANSVERSE CARPAL LIGAMENT- WRIST Left 10/21/2019    Performed by Marylene Land, MD at [facility] OR   ? ENDOSCOPIC RELEASE OF TRANSVERSE CARPAL LIGAMENT- WRIST Right 12/27/2019    Performed by Marylene Land, MD at [facility] OR   ? BREAST REDUCTION Bilateral 07/23/2020   ? LIPOSUCTION  07/23/2020   ? MAMMAPLASTY REDUCTION (INSURANCE) Bilateral 07/23/2020    Performed by Erlene Quan, Zachary George, MD at [facility] OR   ? LATERAL CHEST WALL LIPOSUCTION  (COSMETIC) LIPOSUCTION TO ABDOMEN AND FLANKS Bilateral 07/23/2020    Performed by Stevenson Clinch, MD at [facility] OR   ? CARPAL TUNNEL RELEASE Bilateral 10/2019-12/2019   ? ELECTROCARDIOGRAM     ? HX APPENDECTOMY     ? HX CHOLECYSTECTOMY     ? HX PARTIAL HYSTERECTOMY       Family History   Problem Relation Age of Onset   ? Coronary Artery Disease Mother    ? Allergy-severe Mother    ? Diabetes Mother    ? Heart Attack Mother    ? High Cholesterol Mother    ? Hypertension Mother    ? Asthma Mother    ? Coronary Artery Disease Father    ? Heart Attack Father    ? High Cholesterol Father    ? Hypertension Father    ? Brain Tumor Father    ? Seizures Father    ? Hypertension Brother    ? Diabetes Sister    ? High Cholesterol Sister    ? Hypertension Sister    ? Diabetes Brother    ? Tumor Sister    ? Cancer Sister 36        Brain, deceased   ? Tumor Sister 68        2 in Brain   ? Heart Disease Sister 1     Social History     Tobacco Use   ? Smoking status: Former Smoker     Packs/day: 1.50     Years: 30.00     Pack years: 45.00     Types: Cigarettes     Quit date: 05/02/2012     Years since quitting: 8.9   ? Smokeless tobacco: Never Used   ? Tobacco comment: Currently Vapes   Vaping Use   ? Vaping Use: Former   ? Quit date: 05/14/2020   ? Substances: Nicotine   Substance Use Topics   ? Alcohol use: No     Alcohol/week: 0.0 standard drinks   ? Drug use: No          Review of Systems   All other systems reviewed and are negative.        Objective:         ? biotin 5,000 mcg TbDi Dissolve 2 tablets by mouth twice daily.   ? buPROPion HCL (WELLBUTRIN) 75 mg tablet TAKE ONE TABLET BY MOUTH TWICE DAILY. INDICATIONS: MAJOR DEPRESSIVE DISORDER (Patient taking differently: Take 75 mg by mouth daily. Indications: major depressive disorder)   ? cholecalciferol (VITAMIN D-3) 1,000 units tablet Take 1,000 Units by mouth twice weekly.   ?  clonazePAM (KLONOPIN) 0.5 mg tablet Take 1.5 tablets by mouth twice daily. (Patient taking differently: Take 1 mg by mouth daily.)   ? clonazePAM (KLONOPIN) 1 mg tablet Take one tablet by mouth as Needed (For driving).   ? cyanocobalamin (vitamin B-12) 2,500 mcg tab Take 1 tablet by mouth daily with breakfast.   ? diphenhydrAMINE HCL (BENADRYL) 12.5 mg/5 mL oral solution Take 12.5 mg by mouth every 6 hours as needed.   ? divalproex (DEPAKOTE EC) 250 mg DR tablet Take three tablets by mouth daily. Take with food.   ? famotidine (PEPCID) 20 mg tablet Take 20 mg by mouth daily as needed.   ? multivit, Ca, min-FA-soy isofl 400-60 mcg-mg tab Take 1 tablet by mouth twice daily.   ? mupirocin (CENTANY) 2 % topical ointment Apply  topically to affected area twice daily.   ? nebivoloL (BYSTOLIC) 5 mg tablet TAKE 1 TABLET BY MOUTH EVERY DAY   ? nystatin-triamcinolone 100,000 unit/g / 0.1 % topical cream Apply  topically to affected area twice daily as needed. Apply sparingly to area twice a day for 5 days followed by once a day for 5 days.   ? ondansetron (ZOFRAN ODT) 4 mg rapid dissolve tablet Dissolve two tablets by mouth every 8 hours as needed for Nausea or Vomiting. Place on tongue to dissolve.   ? ondansetron (ZOFRAN ODT) 4 mg rapid dissolve tablet Dissolve one tablet by mouth every 8 hours as needed. Place on tongue to disolve.   ? oxyCODONE (ROXICODONE) 5 mg tablet Take one tablet to two tablets by mouth every 6 hours as needed for Pain.  Alternate taking 1000mg  Tylenol and 600mg  Ibuprofen every 6 hours, scheduled, for three days. Then take as needed. Take Oxycodone for severe breakthough pain >5/10. Do not exceed more than 6 tablets in a 24hr period.   ? pantoprazole DR (PROTONIX) 40 mg tablet TAKE 1 TABLET BY MOUTH EVERY DAY (Patient taking differently: Take 40 mg by mouth as Needed.)   ? Potassium 99 mg tab Take 1 tablet by mouth three times weekly.   ? prochlorperazine maleate (COMPAZINE) 10 mg tablet Take one tablet by mouth every 6 hours as needed for Nausea or Vomiting.   ? rosuvastatin (CRESTOR) 20 mg tablet TAKE 1 TABLET BY MOUTH EVERY DAY (Patient taking differently: at bedtime daily.)   ? sertraline (ZOLOFT) 25 mg tablet Take one tablet by mouth daily. Please take with 50mg  tablets for total daily dose of 75mg    ? sertraline (ZOLOFT) 25 mg tablet Take one tablet by mouth daily. For a total of 75mg  daily.  Indications: anxiousness associated with depression   ? sertraline (ZOLOFT) 50 mg tablet Take one tablet by mouth daily. Please take with 25mg  tablet for total daily dose of 75mg    ? SUMAtriptan succinate (IMITREX) 50 mg tablet Take one tablet by mouth at onset of headache. May repeat after 2 hours if needed. Max of 200 mg in 24 hours.   ? verapamil (CALAN) 40 mg tablet TAKE 1/2 TABLET BY MOUTH IN THE MORNING AND TAKE 1 TABLET IN THE EVENING (Patient taking differently: Take 40 mg by mouth at bedtime daily. TAKE 1/2 TABLET BY MOUTH IN THE MORNING AND TAKE 1 TABLET IN THE EVENING)   ? vitamins, multi w/minerals 9 mg iron-400 mcg tab Take 1 tablet by mouth daily with breakfast.     Vitals:    04/12/21 1113   BP: 113/58   BP Source: Arm, Left Upper   Pulse:  72   PainSc: Eight   Weight: 107.1 kg (236 lb 3.2 oz)   Height: 165.1 cm (5' 5)     Body mass index is 39.31 kg/m?Marland Kitchen     Physical Exam  Constitutional: Well developed female in no acute distress  See vital signs above    Mental Status: Oriented to person, place, and time    Recent and remote memory intact  Normal attention span and concentration  Normal naming and spontaneous speech  Fund of knowledge is satisfactory    Cranial Nerves:   CN II Visual fields intact; PERRL   CN III, IV, VI Extraocular movements intact   CN V Facial sensation intact   CN VII Face is symmetric; strong eye closure   CN VIII Hearing intact   CN IX Symmetric palatal elevation   CN XI Shoulder shrug  R 5  L 5   CN XII Tongue midline on protrusion       Motor/Muscle Strength:   Right Left  Right Left   Shoulder abductors: sling 5 Hip flexion: 5 5   Elbow flexors: sling 5 Hip abduction:     Elbow extensors: sling 5 Hip Adduction:     Wrist extensors:   Knee extension: 5 5   Wrist flexors:   Knee flexion: 5 5   Finger flexors:   Ankle dorsiflexion: 5 5   Finger extensors: bandaged 5 Ankle plantar flexion: 5 5   Finger abductors: bandaged 5 Ankle eversion:     Thumb abductors:   Ankle inversion:        Toe extension:        Toe flexion:       Reflexes:    Right   Left     Brachioradialis   2 2   Biceps   2 2   Triceps 2 2   Knee 2 2   Ankle 2 2     Sensation:  Light touch intact throughout    Coordination:  Normal finger to nose with left hand    No dysmetria    Gait:  Normal stance and stride           Assessment and Plan:  Yarden E Gara is a 50 y.o. female with a PMH significant for HTN, HLD, vertigo on clonazepam, emphysema, obesity s/p gastric sleeve, OSA on BiPAP, anxiety, PTSD, occipital neuralgia, tension headaches, and migraine, who presents for follow up of her headaches.    Chronic Migraine with Aura  - Current frequency: 2-3 severe migraines per month, but only 5 headache-free days per month  - She has previously tried topiramate and had intolerable side effects.  She is not an ideal candidate for a beta blocker due to her relative hypotension in clinic today and her history of emphysema.  She has had an adverse reaction to venlafaxine in the past.  Plan:  > 30 mg Toradol now, in clinic  > Preventive:   Continue Depakote 750 mg DR   Start magnesium 400 mg and riboflavin 400 mg daily   She would likely benefit from a tricyclic antidepressant for migraine prevention, but counseled her to discuss this with her psychiatrist first because she is already on multiple antidepressants.   Could consider a CGRP antagonist in the future  > Acute:   Increase sumatriptan to 100 mg.  Can continue to take this in combination with tylenol, benadryl, and compazine.  Can also repeat sumatriptan (but not whole regimen) once after 2 hours  Vertigo  Her symptoms continue to be well-managed on clonazepam.  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  Plan:  > Continue clonazepam 0.75 mg BID and 1 mg daily PRN    Return to clinic in 6 months.    Renita Papa, MD  Neurology PGY4

## 2021-04-13 ENCOUNTER — Encounter: Admit: 2021-04-13 | Discharge: 2021-04-13 | Payer: PRIVATE HEALTH INSURANCE

## 2021-04-13 DIAGNOSIS — R0600 Dyspnea, unspecified: Secondary | ICD-10-CM

## 2021-04-13 DIAGNOSIS — N3010 Interstitial cystitis (chronic) without hematuria: Secondary | ICD-10-CM

## 2021-04-13 DIAGNOSIS — G44039 Episodic paroxysmal hemicrania, not intractable: Secondary | ICD-10-CM

## 2021-04-13 DIAGNOSIS — I10 Essential (primary) hypertension: Secondary | ICD-10-CM

## 2021-04-13 DIAGNOSIS — E7800 Pure hypercholesterolemia, unspecified: Secondary | ICD-10-CM

## 2021-04-13 DIAGNOSIS — G4733 Obstructive sleep apnea (adult) (pediatric): Secondary | ICD-10-CM

## 2021-04-13 DIAGNOSIS — M549 Dorsalgia, unspecified: Secondary | ICD-10-CM

## 2021-04-13 DIAGNOSIS — R519 Generalized headaches: Secondary | ICD-10-CM

## 2021-04-13 DIAGNOSIS — K859 Acute pancreatitis without necrosis or infection, unspecified: Secondary | ICD-10-CM

## 2021-04-13 DIAGNOSIS — F99 Mental disorder, not otherwise specified: Secondary | ICD-10-CM

## 2021-04-13 DIAGNOSIS — T753XXA Motion sickness, initial encounter: Secondary | ICD-10-CM

## 2021-04-13 DIAGNOSIS — R42 Dizziness and giddiness: Secondary | ICD-10-CM

## 2021-04-13 DIAGNOSIS — J449 Chronic obstructive pulmonary disease, unspecified: Secondary | ICD-10-CM

## 2021-04-13 DIAGNOSIS — J302 Other seasonal allergic rhinitis: Secondary | ICD-10-CM

## 2021-04-13 DIAGNOSIS — Z720 Tobacco use: Secondary | ICD-10-CM

## 2021-04-13 DIAGNOSIS — M1990 Unspecified osteoarthritis, unspecified site: Secondary | ICD-10-CM

## 2021-04-13 DIAGNOSIS — G43909 Migraine, unspecified, not intractable, without status migrainosus: Secondary | ICD-10-CM

## 2021-04-13 DIAGNOSIS — G5601 Carpal tunnel syndrome, right upper limb: Secondary | ICD-10-CM

## 2021-04-17 ENCOUNTER — Encounter: Admit: 2021-04-17 | Discharge: 2021-04-17 | Payer: Commercial Managed Care - HMO

## 2021-04-17 ENCOUNTER — Encounter: Admit: 2021-04-17 | Discharge: 2021-04-17 | Payer: PRIVATE HEALTH INSURANCE

## 2021-04-17 ENCOUNTER — Ambulatory Visit: Admit: 2021-04-17 | Discharge: 2021-04-17 | Payer: Commercial Managed Care - HMO

## 2021-04-17 DIAGNOSIS — G5601 Carpal tunnel syndrome, right upper limb: Principal | ICD-10-CM

## 2021-04-17 DIAGNOSIS — F99 Mental disorder, not otherwise specified: Secondary | ICD-10-CM

## 2021-04-17 DIAGNOSIS — M1990 Unspecified osteoarthritis, unspecified site: Secondary | ICD-10-CM

## 2021-04-17 DIAGNOSIS — J302 Other seasonal allergic rhinitis: Secondary | ICD-10-CM

## 2021-04-17 DIAGNOSIS — M549 Dorsalgia, unspecified: Secondary | ICD-10-CM

## 2021-04-17 DIAGNOSIS — G4733 Obstructive sleep apnea (adult) (pediatric): Secondary | ICD-10-CM

## 2021-04-17 DIAGNOSIS — N3010 Interstitial cystitis (chronic) without hematuria: Secondary | ICD-10-CM

## 2021-04-17 DIAGNOSIS — R0600 Dyspnea, unspecified: Secondary | ICD-10-CM

## 2021-04-17 DIAGNOSIS — R519 Generalized headaches: Secondary | ICD-10-CM

## 2021-04-17 DIAGNOSIS — E7800 Pure hypercholesterolemia, unspecified: Secondary | ICD-10-CM

## 2021-04-17 DIAGNOSIS — J449 Chronic obstructive pulmonary disease, unspecified: Secondary | ICD-10-CM

## 2021-04-17 DIAGNOSIS — T753XXA Motion sickness, initial encounter: Secondary | ICD-10-CM

## 2021-04-17 DIAGNOSIS — R42 Dizziness and giddiness: Secondary | ICD-10-CM

## 2021-04-17 DIAGNOSIS — K859 Acute pancreatitis without necrosis or infection, unspecified: Secondary | ICD-10-CM

## 2021-04-17 DIAGNOSIS — G44039 Episodic paroxysmal hemicrania, not intractable: Secondary | ICD-10-CM

## 2021-04-17 DIAGNOSIS — I10 Essential (primary) hypertension: Secondary | ICD-10-CM

## 2021-04-17 DIAGNOSIS — G43909 Migraine, unspecified, not intractable, without status migrainosus: Secondary | ICD-10-CM

## 2021-04-17 DIAGNOSIS — Z720 Tobacco use: Secondary | ICD-10-CM

## 2021-04-17 NOTE — Progress Notes
Date of Service: 04/17/2021    Subjective:             Christina Garrison is a 50 y.o. female.    History of Present Illness  Here to follow-up.  Frustrated because she went to therapy and they were too worried about moving her so they did not work on range of motion exercises.     Review of Systems   Constitutional: Negative.    HENT: Negative.    Eyes: Negative.    Respiratory: Negative.    Cardiovascular: Negative.    Gastrointestinal: Negative.    Endocrine: Negative.    Genitourinary: Negative.    Musculoskeletal: Negative.    Skin: Negative.    Allergic/Immunologic: Negative.    Neurological: Negative.    Hematological: Negative.    Psychiatric/Behavioral: Negative.          Objective:         ? biotin 5,000 mcg TbDi Dissolve 2 tablets by mouth twice daily.   ? buPROPion HCL (WELLBUTRIN) 75 mg tablet TAKE ONE TABLET BY MOUTH TWICE DAILY. INDICATIONS: MAJOR DEPRESSIVE DISORDER (Patient taking differently: Take 75 mg by mouth daily. Indications: major depressive disorder)   ? cholecalciferol (VITAMIN D-3) 1,000 units tablet Take 1,000 Units by mouth twice weekly.   ? clonazePAM (KLONOPIN) 0.5 mg tablet Take 1.5 tablets by mouth twice daily.   ? clonazePAM (KLONOPIN) 1 mg tablet Take one tablet by mouth as Needed (For driving).   ? cyanocobalamin (vitamin B-12) 2,500 mcg tab Take 1 tablet by mouth daily with breakfast.   ? diphenhydrAMINE HCL (BENADRYL) 12.5 mg/5 mL oral solution Take 12.5 mg by mouth every 6 hours as needed.   ? divalproex (DEPAKOTE EC) 250 mg DR tablet TAKE THREE TABLETS BY MOUTH DAILY. TAKE WITH FOOD.   ? famotidine (PEPCID) 20 mg tablet Take 20 mg by mouth daily as needed.   ? magnesium oxide (MAGOX) 400 mg (241.3 mg magnesium) tablet Take one tablet by mouth daily.   ? multivit, Ca, min-FA-soy isofl 400-60 mcg-mg tab Take 1 tablet by mouth twice daily.   ? mupirocin (CENTANY) 2 % topical ointment Apply  topically to affected area twice daily.   ? nebivoloL (BYSTOLIC) 5 mg tablet TAKE 1 TABLET BY MOUTH EVERY DAY   ? nystatin-triamcinolone 100,000 unit/g / 0.1 % topical cream Apply  topically to affected area twice daily as needed. Apply sparingly to area twice a day for 5 days followed by once a day for 5 days.   ? ondansetron (ZOFRAN ODT) 4 mg rapid dissolve tablet Dissolve two tablets by mouth every 8 hours as needed for Nausea or Vomiting. Place on tongue to dissolve.   ? ondansetron (ZOFRAN ODT) 4 mg rapid dissolve tablet Dissolve one tablet by mouth every 8 hours as needed. Place on tongue to disolve.   ? pantoprazole DR (PROTONIX) 40 mg tablet TAKE 1 TABLET BY MOUTH EVERY DAY (Patient taking differently: Take 40 mg by mouth as Needed.)   ? Potassium 99 mg tab Take 1 tablet by mouth three times weekly.   ? prochlorperazine maleate (COMPAZINE) 10 mg tablet Take one tablet by mouth every 6 hours as needed for Nausea or Vomiting.   ? riboflavin (vitamin B2) 400 mg tab Take one tablet by mouth daily.   ? rosuvastatin (CRESTOR) 20 mg tablet TAKE 1 TABLET BY MOUTH EVERY DAY (Patient taking differently: at bedtime daily.)   ? sertraline (ZOLOFT) 25 mg tablet Take one tablet by mouth daily.  Please take with 50mg  tablets for total daily dose of 75mg    ? sertraline (ZOLOFT) 25 mg tablet Take one tablet by mouth daily. For a total of 75mg  daily.  Indications: anxiousness associated with depression   ? sertraline (ZOLOFT) 50 mg tablet Take one tablet by mouth daily. Please take with 25mg  tablet for total daily dose of 75mg    ? sumatriptan succinate (IMITREX) 100 mg tablet Take one tablet by mouth at onset of headache. May repeat after 2 hours if needed. Max of 200 mg in 24 hours.  Limit use to less than 10 tablets per month.   ? verapamil (CALAN) 40 mg tablet TAKE 1/2 TABLET BY MOUTH IN THE MORNING AND TAKE 1 TABLET IN THE EVENING (Patient taking differently: Take 40 mg by mouth at bedtime daily. TAKE 1/2 TABLET BY MOUTH IN THE MORNING AND TAKE 1 TABLET IN THE EVENING)   ? vitamins, multi w/minerals 9 mg iron-400 mcg tab Take 1 tablet by mouth daily with breakfast.     Vitals:    04/17/21 1342   BP: 115/66   Pulse: 65   PainSc: Six   Weight: 95.3 kg (210 lb)   Height: 165.1 cm (5' 5)     Body mass index is 34.95 kg/m?Marland Kitchen     Physical Exam  Incisions look good, fingers stiff       Assessment and Plan:  Status post right ulnar nerve transposition the elbow, carpal tunnel/Guyon's release, multiple trigger finger releases  Referred to therapy at [place] to start working more aggressively on range of motion today.  Follow-up the end of next week with [facility] for suture removal  Follow-up with me a couple weeks later

## 2021-04-17 NOTE — Progress Notes
Hand Therapy Orthosis/Evaluation and Plan of Care:     Name: Christina Garrison, Christina Garrison  DOB:[birth year]  IHK#:[id number]  Referring Physician:  Insurance:[employer]  Injury/Onset Date:    Preoperative Diagnosis:    1. Right ulnar neuropathy at the elbow.  2. Right elbow medial epicondylitis.  3. 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  4. Right carpal tunnel syndrome.  5.  Trigger finger right middle/ring/small finger  6.  Right thumb CMC joint arthritis  Surgical Date: 04/11/2021  LENGTHENING/ SHORTENING FLEXOR/ EXTENSOR TENDON FOREARM/ WRIST - middle ring/small finger trigger release, right thumb CMC joint cortisol injection (Right)  DECOMPRESSION MEDIAN NERVE AT CARPAL TUNNEL (Right)  NEUROPLASTY/ TRANSPOSITION ULNAR NERVE AT ELBOW (Right)  NEUROPLASTY/ TRANSPOSITION ULNAR NERVE AT WRIST (Right)  Medical Diagnosis: fcx  Treatment Diagnosis: Same  Follow-up Physician Appointment:  Visit #  1     Subjective: I am going to go to therapy closer to home.      Pain: Right:  Location: Elbow and Forearm    Pain Rating:   Current: 7/10     Pain Description:Burning  Comments:      Patient Goals: To regain ROM and function of RUE, symptom improvement     Objective:     Evaluation:    AROM RIGHT    ELBOW/FOREARM    Elbow Extension/Flexion 35/130   Forearm Pronation/Supination 85/70         WRIST/FOREARM    Wrist Extension/Flexion 55/35   Wrist Radial/Ulnar Deviation 15/20   Forearm Pronation/Supination            Hand Exam     Treatment:     Right:   Shoulder, Elbow, Forearm, Wrist, Hand and Thumb     Treatment Provided:    Therapeutic Exercise: AROM  AAROM/Active Assistive  PROM/Passive    Education:Handout Issued: Finger AROM Exercises Active Wrist and Forearm Exercises Active Elbow and Forearm Exercises              Orthosis:     Patient uses a sling PRN. Educated patient continuing ROM of right shoulder    Assessment: Patient to benefit from therapy for RUE.    Problems: Decreased PROM, Decreased AROM, Decreased Strength, Altered Sensation, Edema, Decreased Self-Care/ADL Independence and Pain    Rationale for Therapy:Clinical Judgment and Evaluation    Rehab Potential: Good     Contraindications to Therapy:none    Long Term Treatment Goals:    Increase AROM while decreasing stiffness, swelling and pain to improve functional performance.    Short Term Treatment Goals:      Goal Number:  1  Goal: Patient to verbalize/demonstrate exercises correctly.  Goal Status:  Met     Plan Of Care:    Treatment Plan:     Treatment to be provided:Exercise: AROM  AAROM/Active Assistive  PROM/Passive    Frequency of treatment:  To receive therapy at facility closer to home.

## 2021-04-25 ENCOUNTER — Encounter: Admit: 2021-04-25 | Discharge: 2021-04-25 | Payer: PRIVATE HEALTH INSURANCE

## 2021-04-25 ENCOUNTER — Ambulatory Visit: Admit: 2021-04-25 | Discharge: 2021-04-25 | Payer: Commercial Managed Care - HMO

## 2021-04-25 DIAGNOSIS — G5601 Carpal tunnel syndrome, right upper limb: Secondary | ICD-10-CM

## 2021-04-25 DIAGNOSIS — M65351 Trigger finger, right little finger: Secondary | ICD-10-CM

## 2021-04-25 DIAGNOSIS — M7701 Medial epicondylitis, right elbow: Secondary | ICD-10-CM

## 2021-04-25 DIAGNOSIS — G5621 Lesion of ulnar nerve, right upper limb: Secondary | ICD-10-CM

## 2021-04-25 DIAGNOSIS — M65341 Trigger finger, right ring finger: Secondary | ICD-10-CM

## 2021-04-25 DIAGNOSIS — M65331 Trigger finger, right middle finger: Secondary | ICD-10-CM

## 2021-04-25 NOTE — Progress Notes
Patient presents for suture removal. The wound is well healed without signs of infection.  The sutures are removed. Return 05/08/2021 with Dr. Felecia Shelling.

## 2021-04-29 ENCOUNTER — Ambulatory Visit: Admit: 2021-04-29 | Discharge: 2021-04-29 | Payer: Commercial Managed Care - HMO

## 2021-04-29 ENCOUNTER — Encounter: Admit: 2021-04-29 | Discharge: 2021-04-29 | Payer: PRIVATE HEALTH INSURANCE

## 2021-04-29 DIAGNOSIS — F418 Other specified anxiety disorders: Secondary | ICD-10-CM

## 2021-04-29 DIAGNOSIS — T753XXA Motion sickness, initial encounter: Secondary | ICD-10-CM

## 2021-04-29 DIAGNOSIS — N3010 Interstitial cystitis (chronic) without hematuria: Secondary | ICD-10-CM

## 2021-04-29 DIAGNOSIS — G43009 Migraine without aura, not intractable, without status migrainosus: Secondary | ICD-10-CM

## 2021-04-29 DIAGNOSIS — E7800 Pure hypercholesterolemia, unspecified: Secondary | ICD-10-CM

## 2021-04-29 DIAGNOSIS — G43909 Migraine, unspecified, not intractable, without status migrainosus: Secondary | ICD-10-CM

## 2021-04-29 DIAGNOSIS — G44039 Episodic paroxysmal hemicrania, not intractable: Secondary | ICD-10-CM

## 2021-04-29 DIAGNOSIS — R519 Generalized headaches: Secondary | ICD-10-CM

## 2021-04-29 DIAGNOSIS — R0600 Dyspnea, unspecified: Secondary | ICD-10-CM

## 2021-04-29 DIAGNOSIS — F341 Dysthymic disorder: Secondary | ICD-10-CM

## 2021-04-29 DIAGNOSIS — R42 Dizziness and giddiness: Secondary | ICD-10-CM

## 2021-04-29 DIAGNOSIS — F4310 Post-traumatic stress disorder, unspecified: Secondary | ICD-10-CM

## 2021-04-29 DIAGNOSIS — J302 Other seasonal allergic rhinitis: Secondary | ICD-10-CM

## 2021-04-29 DIAGNOSIS — I10 Essential (primary) hypertension: Secondary | ICD-10-CM

## 2021-04-29 DIAGNOSIS — G5601 Carpal tunnel syndrome, right upper limb: Secondary | ICD-10-CM

## 2021-04-29 DIAGNOSIS — J449 Chronic obstructive pulmonary disease, unspecified: Secondary | ICD-10-CM

## 2021-04-29 DIAGNOSIS — K859 Acute pancreatitis without necrosis or infection, unspecified: Secondary | ICD-10-CM

## 2021-04-29 DIAGNOSIS — Z720 Tobacco use: Secondary | ICD-10-CM

## 2021-04-29 DIAGNOSIS — M549 Dorsalgia, unspecified: Secondary | ICD-10-CM

## 2021-04-29 DIAGNOSIS — F99 Mental disorder, not otherwise specified: Secondary | ICD-10-CM

## 2021-04-29 DIAGNOSIS — G4733 Obstructive sleep apnea (adult) (pediatric): Secondary | ICD-10-CM

## 2021-04-29 DIAGNOSIS — M1990 Unspecified osteoarthritis, unspecified site: Secondary | ICD-10-CM

## 2021-04-29 MED ORDER — BUPROPION HCL 75 MG PO TAB
75 mg | ORAL_TABLET | Freq: Every day | ORAL | 2 refills | Status: AC
Start: 2021-04-29 — End: ?
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
## 2021-04-29 NOTE — Progress Notes
ATTENDING NOTE  I saw and evaluated Christina Garrison and discussed with Samella Parr, MD and concur with the assessment and treatment plan. Patient is 50 y.o. female with MDD, GAD and PTSD. Psychiatric symptoms well controlled at today's encounter. Denies SI/HI and AVH and no other safety concerns. Pt reports no medication side effects.    PLAN:  The following medications will be continued for the above symptoms:  1. Continue Klonopin 1.5mg  PO BID and 1mg  PO Daily PRN  2. Continue Zoloft 75mg  PO Daily  3. Continue Depakote DR 250mg  PO QAM and 500mg  PO QHS  4. Continue Wellbutrin IR 75mg  PO Daily   5. Recommend Psychotherapy  6. No labs needed    ? biotin 5,000 mcg TbDi Dissolve 2 tablets by mouth twice daily.   ? buPROPion HCL (WELLBUTRIN) 75 mg tablet Take one tablet by mouth daily for 90 days. Indications: major depressive disorder   ? cholecalciferol (VITAMIN D-3) 1,000 units tablet Take 1,000 Units by mouth twice weekly.   ? clonazePAM (KLONOPIN) 0.5 mg tablet Take 1.5 tablets by mouth twice daily.   ? clonazePAM (KLONOPIN) 1 mg tablet Take one tablet by mouth as Needed (For driving).   ? cyanocobalamin (vitamin B-12) 2,500 mcg tab Take 1 tablet by mouth daily with breakfast.   ? diphenhydrAMINE HCL (BENADRYL) 12.5 mg/5 mL oral solution Take 12.5 mg by mouth every 6 hours as needed.   ? divalproex (DEPAKOTE EC) 250 mg DR tablet TAKE THREE TABLETS BY MOUTH DAILY. TAKE WITH FOOD.   ? famotidine (PEPCID) 20 mg tablet Take 20 mg by mouth daily as needed.   ? magnesium oxide (MAGOX) 400 mg (241.3 mg magnesium) tablet Take one tablet by mouth daily.   ? multivit, Ca, min-FA-soy isofl 400-60 mcg-mg tab Take 1 tablet by mouth twice daily.   ? mupirocin (CENTANY) 2 % topical ointment Apply  topically to affected area twice daily.   ? nebivoloL (BYSTOLIC) 5 mg tablet TAKE 1 TABLET BY MOUTH EVERY DAY   ? nystatin-triamcinolone 100,000 unit/g / 0.1 % topical cream Apply  topically to affected area twice daily as needed. Apply sparingly to area twice a day for 5 days followed by once a day for 5 days.   ? ondansetron (ZOFRAN ODT) 4 mg rapid dissolve tablet Dissolve two tablets by mouth every 8 hours as needed for Nausea or Vomiting. Place on tongue to dissolve.   ? ondansetron (ZOFRAN ODT) 4 mg rapid dissolve tablet Dissolve one tablet by mouth every 8 hours as needed. Place on tongue to disolve.   ? pantoprazole DR (PROTONIX) 40 mg tablet TAKE 1 TABLET BY MOUTH EVERY DAY (Patient taking differently: Take 40 mg by mouth as Needed.)   ? Potassium 99 mg tab Take 1 tablet by mouth three times weekly.   ? prochlorperazine maleate (COMPAZINE) 10 mg tablet Take one tablet by mouth every 6 hours as needed for Nausea or Vomiting.   ? riboflavin (vitamin B2) 400 mg tab Take one tablet by mouth daily.   ? rosuvastatin (CRESTOR) 20 mg tablet TAKE 1 TABLET BY MOUTH EVERY DAY (Patient taking differently: at bedtime daily.)   ? sertraline (ZOLOFT) 25 mg tablet Take one tablet by mouth daily. Please take with 50mg  tablets for total daily dose of 75mg    ? sertraline (ZOLOFT) 50 mg tablet Take one tablet by mouth daily. Please take with 25mg  tablet for total daily dose of 75mg    ? sumatriptan succinate (IMITREX) 100 mg tablet Take  one tablet by mouth at onset of headache. May repeat after 2 hours if needed. Max of 200 mg in 24 hours.  Limit use to less than 10 tablets per month.   ? verapamil (CALAN) 40 mg tablet TAKE 1/2 TABLET BY MOUTH IN THE MORNING AND TAKE 1 TABLET IN THE EVENING (Patient taking differently: Take 40 mg by mouth at bedtime daily. TAKE 1/2 TABLET BY MOUTH IN THE MORNING AND TAKE 1 TABLET IN THE EVENING)   ? vitamins, multi w/minerals 9 mg iron-400 mcg tab Take 1 tablet by mouth daily with breakfast.       Rae Mar, MD  04/29/2021

## 2021-05-08 ENCOUNTER — Ambulatory Visit: Admit: 2021-05-08 | Discharge: 2021-05-08 | Payer: Commercial Managed Care - HMO

## 2021-05-08 ENCOUNTER — Encounter: Admit: 2021-05-08 | Discharge: 2021-05-08 | Payer: PRIVATE HEALTH INSURANCE

## 2021-05-08 DIAGNOSIS — I10 Essential (primary) hypertension: Secondary | ICD-10-CM

## 2021-05-08 DIAGNOSIS — E7800 Pure hypercholesterolemia, unspecified: Secondary | ICD-10-CM

## 2021-05-08 DIAGNOSIS — M549 Dorsalgia, unspecified: Secondary | ICD-10-CM

## 2021-05-08 DIAGNOSIS — G4733 Obstructive sleep apnea (adult) (pediatric): Secondary | ICD-10-CM

## 2021-05-08 DIAGNOSIS — J302 Other seasonal allergic rhinitis: Secondary | ICD-10-CM

## 2021-05-08 DIAGNOSIS — N3010 Interstitial cystitis (chronic) without hematuria: Secondary | ICD-10-CM

## 2021-05-08 DIAGNOSIS — Z720 Tobacco use: Secondary | ICD-10-CM

## 2021-05-08 DIAGNOSIS — T753XXA Motion sickness, initial encounter: Secondary | ICD-10-CM

## 2021-05-08 DIAGNOSIS — K859 Acute pancreatitis without necrosis or infection, unspecified: Secondary | ICD-10-CM

## 2021-05-08 DIAGNOSIS — G5601 Carpal tunnel syndrome, right upper limb: Secondary | ICD-10-CM

## 2021-05-08 DIAGNOSIS — M1990 Unspecified osteoarthritis, unspecified site: Secondary | ICD-10-CM

## 2021-05-08 DIAGNOSIS — G44039 Episodic paroxysmal hemicrania, not intractable: Secondary | ICD-10-CM

## 2021-05-08 DIAGNOSIS — J449 Chronic obstructive pulmonary disease, unspecified: Secondary | ICD-10-CM

## 2021-05-08 DIAGNOSIS — F99 Mental disorder, not otherwise specified: Secondary | ICD-10-CM

## 2021-05-08 DIAGNOSIS — G43909 Migraine, unspecified, not intractable, without status migrainosus: Secondary | ICD-10-CM

## 2021-05-08 DIAGNOSIS — R519 Generalized headaches: Secondary | ICD-10-CM

## 2021-05-08 DIAGNOSIS — R0600 Dyspnea, unspecified: Secondary | ICD-10-CM

## 2021-05-08 DIAGNOSIS — R42 Dizziness and giddiness: Secondary | ICD-10-CM

## 2021-05-08 NOTE — Progress Notes
Subjective:       Here to follow-up.  Had a bit of a difficult experience in physical therapy, but feels as though she is working pretty well on her own to rehab.    Christina Garrison is a 50 y.o. female.       Review of Systems   Constitutional: Negative.    HENT: Negative.    Eyes: Negative.    Respiratory: Negative.    Cardiovascular: Negative.    Gastrointestinal: Negative.    Endocrine: Negative.    Genitourinary: Negative.    Musculoskeletal: Negative.    Skin: Negative.    Allergic/Immunologic: Negative.    Neurological: Positive for headaches.   Hematological: Negative.    Psychiatric/Behavioral: Negative.          Objective:         ? biotin 5,000 mcg TbDi Dissolve 2 tablets by mouth twice daily.   ? buPROPion HCL (WELLBUTRIN) 75 mg tablet Take one tablet by mouth daily for 90 days. Indications: major depressive disorder   ? cholecalciferol (VITAMIN D-3) 1,000 units tablet Take 1,000 Units by mouth twice weekly.   ? clonazePAM (KLONOPIN) 0.5 mg tablet Take 1.5 tablets by mouth twice daily.   ? clonazePAM (KLONOPIN) 1 mg tablet Take one tablet by mouth as Needed (For driving).   ? cyanocobalamin (vitamin B-12) 2,500 mcg tab Take 1 tablet by mouth daily with breakfast.   ? diphenhydrAMINE HCL (BENADRYL) 12.5 mg/5 mL oral solution Take 12.5 mg by mouth every 6 hours as needed.   ? divalproex (DEPAKOTE EC) 250 mg DR tablet TAKE THREE TABLETS BY MOUTH DAILY. TAKE WITH FOOD.   ? famotidine (PEPCID) 20 mg tablet Take 20 mg by mouth daily as needed.   ? magnesium oxide (MAGOX) 400 mg (241.3 mg magnesium) tablet Take one tablet by mouth daily.   ? multivit, Ca, min-FA-soy isofl 400-60 mcg-mg tab Take 1 tablet by mouth twice daily.   ? mupirocin (CENTANY) 2 % topical ointment Apply  topically to affected area twice daily.   ? nebivoloL (BYSTOLIC) 5 mg tablet TAKE 1 TABLET BY MOUTH EVERY DAY   ? ondansetron (ZOFRAN ODT) 4 mg rapid dissolve tablet Dissolve two tablets by mouth every 8 hours as needed for Nausea or Vomiting. Place on tongue to dissolve.   ? ondansetron (ZOFRAN ODT) 4 mg rapid dissolve tablet Dissolve one tablet by mouth every 8 hours as needed. Place on tongue to disolve.   ? pantoprazole DR (PROTONIX) 40 mg tablet TAKE 1 TABLET BY MOUTH EVERY DAY (Patient taking differently: Take 40 mg by mouth as Needed.)   ? Potassium 99 mg tab Take 1 tablet by mouth three times weekly.   ? prochlorperazine maleate (COMPAZINE) 10 mg tablet Take one tablet by mouth every 6 hours as needed for Nausea or Vomiting.   ? riboflavin (vitamin B2) 400 mg tab Take one tablet by mouth daily.   ? rosuvastatin (CRESTOR) 20 mg tablet TAKE 1 TABLET BY MOUTH EVERY DAY (Patient taking differently: at bedtime daily.)   ? sertraline (ZOLOFT) 25 mg tablet Take one tablet by mouth daily. Please take with 50mg  tablets for total daily dose of 75mg    ? sertraline (ZOLOFT) 50 mg tablet Take one tablet by mouth daily. Please take with 25mg  tablet for total daily dose of 75mg    ? sumatriptan succinate (IMITREX) 100 mg tablet Take one tablet by mouth at onset of headache. May repeat after 2 hours if needed. Max of 200 mg  in 24 hours.  Limit use to less than 10 tablets per month.   ? verapamil (CALAN) 40 mg tablet TAKE 1/2 TABLET BY MOUTH IN THE MORNING AND TAKE 1 TABLET IN THE EVENING (Patient taking differently: Take 40 mg by mouth at bedtime daily. TAKE 1/2 TABLET BY MOUTH IN THE MORNING AND TAKE 1 TABLET IN THE EVENING)   ? vitamins, multi w/minerals 9 mg iron-400 mcg tab Take 1 tablet by mouth daily with breakfast.     Vitals:    05/08/21 1454   BP: 129/78   Pulse: 70   PainSc: Four     There is no height or weight on file to calculate BMI.     Physical Exam  Right elbow and palm incisions healing well, range of motion coming along nicely       Assessment and Plan:  Status post right ulnar nerve transposition elbow, carpal tunnel release, Guyon's release, multiple trigger fingers release  Keep working on range of motion, follow-up with me in about a month

## 2021-06-12 ENCOUNTER — Encounter: Admit: 2021-06-12 | Discharge: 2021-06-12 | Payer: PRIVATE HEALTH INSURANCE

## 2021-06-12 ENCOUNTER — Ambulatory Visit: Admit: 2021-06-12 | Discharge: 2021-06-12 | Payer: Commercial Managed Care - HMO

## 2021-06-12 DIAGNOSIS — R519 Generalized headaches: Secondary | ICD-10-CM

## 2021-06-12 DIAGNOSIS — N3010 Interstitial cystitis (chronic) without hematuria: Secondary | ICD-10-CM

## 2021-06-12 DIAGNOSIS — K859 Acute pancreatitis without necrosis or infection, unspecified: Secondary | ICD-10-CM

## 2021-06-12 DIAGNOSIS — M549 Dorsalgia, unspecified: Secondary | ICD-10-CM

## 2021-06-12 DIAGNOSIS — R42 Dizziness and giddiness: Secondary | ICD-10-CM

## 2021-06-12 DIAGNOSIS — M1990 Unspecified osteoarthritis, unspecified site: Secondary | ICD-10-CM

## 2021-06-12 DIAGNOSIS — J302 Other seasonal allergic rhinitis: Secondary | ICD-10-CM

## 2021-06-12 DIAGNOSIS — G5601 Carpal tunnel syndrome, right upper limb: Secondary | ICD-10-CM

## 2021-06-12 DIAGNOSIS — G43909 Migraine, unspecified, not intractable, without status migrainosus: Secondary | ICD-10-CM

## 2021-06-12 DIAGNOSIS — M7701 Medial epicondylitis, right elbow: Secondary | ICD-10-CM

## 2021-06-12 DIAGNOSIS — G4733 Obstructive sleep apnea (adult) (pediatric): Secondary | ICD-10-CM

## 2021-06-12 DIAGNOSIS — I10 Essential (primary) hypertension: Secondary | ICD-10-CM

## 2021-06-12 DIAGNOSIS — T753XXA Motion sickness, initial encounter: Secondary | ICD-10-CM

## 2021-06-12 DIAGNOSIS — J449 Chronic obstructive pulmonary disease, unspecified: Secondary | ICD-10-CM

## 2021-06-12 DIAGNOSIS — R0600 Dyspnea, unspecified: Secondary | ICD-10-CM

## 2021-06-12 DIAGNOSIS — Z720 Tobacco use: Secondary | ICD-10-CM

## 2021-06-12 DIAGNOSIS — G44039 Episodic paroxysmal hemicrania, not intractable: Secondary | ICD-10-CM

## 2021-06-12 DIAGNOSIS — M7711 Lateral epicondylitis, right elbow: Secondary | ICD-10-CM

## 2021-06-12 DIAGNOSIS — F99 Mental disorder, not otherwise specified: Secondary | ICD-10-CM

## 2021-06-12 DIAGNOSIS — E7800 Pure hypercholesterolemia, unspecified: Secondary | ICD-10-CM

## 2021-06-12 NOTE — Progress Notes
Subjective:       Here to follow-up.  Having quite a bit of right lateral elbow pain.  Gets some intermittent numbness in her small and ring fingers at times as well.    Christina Garrison is a 50 y.o. female.       Review of Systems   Constitutional: Negative.    HENT: Negative.    Eyes: Negative.    Respiratory: Negative.    Cardiovascular: Negative.    Gastrointestinal: Negative.    Endocrine: Negative.    Genitourinary: Negative.    Musculoskeletal: Negative.    Skin: Negative.    Allergic/Immunologic: Negative.    Neurological: Positive for headaches.   Hematological: Negative.    Psychiatric/Behavioral: Negative.    All other systems reviewed and are negative.        Objective:         ? biotin 5,000 mcg TbDi Dissolve 2 tablets by mouth twice daily.   ? buPROPion HCL (WELLBUTRIN) 75 mg tablet Take one tablet by mouth daily for 90 days. Indications: major depressive disorder   ? cholecalciferol (VITAMIN D-3) 1,000 units tablet Take 1,000 Units by mouth twice weekly.   ? clonazePAM (KLONOPIN) 0.5 mg tablet Take 1.5 tablets by mouth twice daily.   ? clonazePAM (KLONOPIN) 1 mg tablet Take one tablet by mouth as Needed (For driving).   ? cyanocobalamin (vitamin B-12) 2,500 mcg tab Take 1 tablet by mouth daily with breakfast.   ? diphenhydrAMINE HCL (BENADRYL) 12.5 mg/5 mL oral solution Take 12.5 mg by mouth every 6 hours as needed.   ? divalproex (DEPAKOTE EC) 250 mg DR tablet TAKE THREE TABLETS BY MOUTH DAILY. TAKE WITH FOOD.   ? famotidine (PEPCID) 20 mg tablet Take 20 mg by mouth daily as needed.   ? magnesium oxide (MAGOX) 400 mg (241.3 mg magnesium) tablet Take one tablet by mouth daily.   ? multivit, Ca, min-FA-soy isofl 400-60 mcg-mg tab Take 1 tablet by mouth twice daily.   ? mupirocin (CENTANY) 2 % topical ointment Apply  topically to affected area twice daily.   ? nebivoloL (BYSTOLIC) 5 mg tablet TAKE 1 TABLET BY MOUTH EVERY DAY   ? ondansetron (ZOFRAN ODT) 4 mg rapid dissolve tablet Dissolve two tablets by mouth every 8 hours as needed for Nausea or Vomiting. Place on tongue to dissolve.   ? ondansetron (ZOFRAN ODT) 4 mg rapid dissolve tablet Dissolve one tablet by mouth every 8 hours as needed. Place on tongue to disolve.   ? pantoprazole DR (PROTONIX) 40 mg tablet TAKE 1 TABLET BY MOUTH EVERY DAY (Patient taking differently: Take 40 mg by mouth as Needed.)   ? Potassium 99 mg tab Take 1 tablet by mouth three times weekly.   ? prochlorperazine maleate (COMPAZINE) 10 mg tablet Take one tablet by mouth every 6 hours as needed for Nausea or Vomiting.   ? riboflavin (vitamin B2) 400 mg tab Take one tablet by mouth daily.   ? rosuvastatin (CRESTOR) 20 mg tablet TAKE 1 TABLET BY MOUTH EVERY DAY (Patient taking differently: at bedtime daily.)   ? sertraline (ZOLOFT) 25 mg tablet Take one tablet by mouth daily. Please take with 50mg  tablets for total daily dose of 75mg    ? sertraline (ZOLOFT) 50 mg tablet Take one tablet by mouth daily. Please take with 25mg  tablet for total daily dose of 75mg    ? sumatriptan succinate (IMITREX) 100 mg tablet Take one tablet by mouth at onset of headache. May repeat after  2 hours if needed. Max of 200 mg in 24 hours.  Limit use to less than 10 tablets per month.   ? verapamil (CALAN) 40 mg tablet TAKE 1/2 TABLET BY MOUTH IN THE MORNING AND TAKE 1 TABLET IN THE EVENING (Patient taking differently: Take 40 mg by mouth at bedtime daily. TAKE 1/2 TABLET BY MOUTH IN THE MORNING AND TAKE 1 TABLET IN THE EVENING)   ? vitamins, multi w/minerals 9 mg iron-400 mcg tab Take 1 tablet by mouth daily with breakfast.     Vitals:    06/12/21 1452   BP: 122/72   Pulse: 69   Temp: 36.5 ?C (97.7 ?F)   SpO2: 97%   TempSrc: Oral   PainSc: Five   Weight: 86.2 kg (190 lb)   Height: 165.1 cm (5' 5)     Body mass index is 31.62 kg/m?Marland Kitchen     Physical Exam  Right elbow substantial tenderness to palpation over lateral epicondyle       Assessment and Plan:  Right lateral epicondylitis  Gave new therapy order to work on rebalancing extensors  Follow-up in 4 to 6 weeks

## 2021-06-12 NOTE — Progress Notes
Hand therapy referral faxed to [facility] at [phone]

## 2021-06-13 ENCOUNTER — Encounter: Admit: 2021-06-13 | Discharge: 2021-06-13 | Payer: PRIVATE HEALTH INSURANCE

## 2021-06-19 ENCOUNTER — Encounter: Admit: 2021-06-19 | Discharge: 2021-06-19 | Payer: PRIVATE HEALTH INSURANCE

## 2021-07-03 ENCOUNTER — Encounter: Admit: 2021-07-03 | Discharge: 2021-07-03 | Payer: PRIVATE HEALTH INSURANCE

## 2021-07-03 ENCOUNTER — Ambulatory Visit: Admit: 2021-07-03 | Discharge: 2021-07-03 | Payer: Commercial Managed Care - HMO

## 2021-07-03 DIAGNOSIS — K642 Third degree hemorrhoids: Secondary | ICD-10-CM

## 2021-07-03 DIAGNOSIS — N3010 Interstitial cystitis (chronic) without hematuria: Secondary | ICD-10-CM

## 2021-07-03 DIAGNOSIS — J302 Other seasonal allergic rhinitis: Secondary | ICD-10-CM

## 2021-07-03 DIAGNOSIS — R42 Dizziness and giddiness: Secondary | ICD-10-CM

## 2021-07-03 DIAGNOSIS — K649 Unspecified hemorrhoids: Secondary | ICD-10-CM

## 2021-07-03 DIAGNOSIS — I10 Essential (primary) hypertension: Secondary | ICD-10-CM

## 2021-07-03 DIAGNOSIS — J449 Chronic obstructive pulmonary disease, unspecified: Secondary | ICD-10-CM

## 2021-07-03 DIAGNOSIS — G4733 Obstructive sleep apnea (adult) (pediatric): Secondary | ICD-10-CM

## 2021-07-03 DIAGNOSIS — F99 Mental disorder, not otherwise specified: Secondary | ICD-10-CM

## 2021-07-03 DIAGNOSIS — G5601 Carpal tunnel syndrome, right upper limb: Secondary | ICD-10-CM

## 2021-07-03 DIAGNOSIS — Z720 Tobacco use: Secondary | ICD-10-CM

## 2021-07-03 DIAGNOSIS — R519 Generalized headaches: Secondary | ICD-10-CM

## 2021-07-03 DIAGNOSIS — M549 Dorsalgia, unspecified: Secondary | ICD-10-CM

## 2021-07-03 DIAGNOSIS — R0600 Dyspnea, unspecified: Secondary | ICD-10-CM

## 2021-07-03 DIAGNOSIS — G44039 Episodic paroxysmal hemicrania, not intractable: Secondary | ICD-10-CM

## 2021-07-03 DIAGNOSIS — G43909 Migraine, unspecified, not intractable, without status migrainosus: Secondary | ICD-10-CM

## 2021-07-03 DIAGNOSIS — E7800 Pure hypercholesterolemia, unspecified: Secondary | ICD-10-CM

## 2021-07-03 DIAGNOSIS — M1990 Unspecified osteoarthritis, unspecified site: Secondary | ICD-10-CM

## 2021-07-03 DIAGNOSIS — T753XXA Motion sickness, initial encounter: Secondary | ICD-10-CM

## 2021-07-03 DIAGNOSIS — K859 Acute pancreatitis without necrosis or infection, unspecified: Secondary | ICD-10-CM

## 2021-07-03 MED ORDER — CEFAZOLIN 1 GRAM IJ SOLR
2 g | Freq: Once | INTRAVENOUS | 0 refills
Start: 2021-07-03 — End: ?

## 2021-07-03 NOTE — Progress Notes
Date of Service: 07/03/2021      HPI       I had the pleasure of seeing Christina Garrison in the General Surgery clinic today. Patient is a 50 y.o. female with a presenting issue of hemorrhoids.  I have known Christina Garrison for quite some time I repaired hemorrhoids for her with a minimally invasive THD procedure around 2012 or 2013.  She had done very well with those repairs up until approximately 1 year ago where she notes that she began to have some bleeding of bright red blood, itching and burning and protrusion of tissue.  She notes that her most recent colonoscopy was approximately 3 years ago at [facility] that she states was normal.  In the past I had also performed a lateral sphincterotomy for her for anal fissure.  Is uncertain of how much fiber she gets daily.    She has comorbidities of COPD, history of interstitial cystitis, obstructive sleep apnea.         Vitals:    07/03/21 1513   BP: 138/72   BP Source: Arm, Left Upper   Pulse: 71   Temp: 36.3 ?C (97.3 ?F)   SpO2: 98%   TempSrc: Temporal   PainSc: Three   Weight: 109.7 kg (241 lb 12.8 oz)   Height: 165.1 cm (5' 5)     Body mass index is 40.24 kg/m?Marland Kitchen    Past Medical History  Medical History:   Diagnosis Date   ? Arthritis    ? Back pain    ? Carpal tunnel syndrome on right 12/27/2019   ? COPD (chronic obstructive pulmonary disease) (HCC)    ? Dyspnea     per pt-post op 2015.  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 Generalized headaches    ? High cholesterol    ? Hypertension 11/21/2010   ? Interstitial cystitis    ? Migraines    ? Motion sickness    ? OSA (obstructive sleep apnea)     uses NC w CA   ? Pancreatitis 11/21/2010   ? 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 Psychiatric illness     Bipolar; Anxiety; Depression   ? Seasonal allergic reaction    ? Tobacco abuse    ? Vertigo      Surgical History:   Procedure Laterality Date   ? SINUS SURGERY  2004   ? PR LAPS GSTRC RSTRICTIV PX LONGITUDINAL GASTRECTOMY  03/2014    per pt code blue the day after-I couldn't breath- I had fluid on my lungs ([place])   ? EGD N/A 08/23/2018    Performed by Vertell Novak, MD at [facility] OR   ? COLONOSCOPY DIAGNOSTIC WITH SPECIMEN COLLECTION BY BRUSHING/ WASHING - FLEXIBLE N/A 08/23/2018    Performed by Vertell Novak, MD at [facility] OR   ? OPEN REPAIR SUPRAUMBILICAL HERNIA WITH MESH N/A 09/23/2018    Performed by Lelan Pons., MD at [facility] OR   ? 24HR pH w/ Impedance -OFF PPIs N/A 01/27/2019    Performed by Tommie Sams, MD at [facility] ENDO   ? ESOPHAGEAL MOTILITY STUDY N/A 01/27/2019    Performed by Tommie Sams, MD at [facility] ENDO   ? ENDOSCOPIC RELEASE OF TRANSVERSE CARPAL LIGAMENT- WRIST Left 10/21/2019    Performed by Marylene Land, MD at [facility] OR   ? ENDOSCOPIC RELEASE OF TRANSVERSE CARPAL LIGAMENT- WRIST Right 12/27/2019    Performed by Clemens Catholic,  Farrel Demark, MD at [facility] OR   ? BREAST REDUCTION Bilateral 07/23/2020   ? LIPOSUCTION  07/23/2020   ? MAMMAPLASTY REDUCTION (INSURANCE) Bilateral 07/23/2020    Performed by Erlene Quan, Zachary George, MD at [facility] OR   ? LATERAL CHEST WALL LIPOSUCTION  (COSMETIC) LIPOSUCTION TO ABDOMEN AND FLANKS Bilateral 07/23/2020    Performed by Stevenson Clinch, MD at [facility] OR   ? LENGTHENING/ SHORTENING FLEXOR/ EXTENSOR TENDON FOREARM/ WRIST - middle ring/small finger trigger release, right thumb CMC joint cortisol injection Right 04/11/2021    Performed by Nicky Pugh, MD at [facility] OR   ? DECOMPRESSION MEDIAN NERVE AT CARPAL TUNNEL Right 04/11/2021    Performed by Nicky Pugh, MD at [facility] OR   ? NEUROPLASTY/ TRANSPOSITION ULNAR NERVE AT ELBOW Right 04/11/2021    Performed by Nicky Pugh, MD at [facility] OR   ? NEUROPLASTY/ TRANSPOSITION ULNAR NERVE AT WRIST Right 04/11/2021    Performed by Nicky Pugh, MD at [facility] OR   ? TOOTH EXTRACTION Left 04/21/2021   ? CARPAL TUNNEL RELEASE Bilateral 10/2019-12/2019   ? ELECTROCARDIOGRAM     ? HX APPENDECTOMY     ? HX CHOLECYSTECTOMY     ? HX PARTIAL HYSTERECTOMY       Family History   Problem Relation Age of Onset   ? Coronary Artery Disease Mother    ? Allergy-severe Mother    ? Diabetes Mother    ? Heart Attack Mother    ? High Cholesterol Mother    ? Hypertension Mother    ? Asthma Mother    ? Coronary Artery Disease Father    ? Heart Attack Father    ? High Cholesterol Father    ? Hypertension Father    ? Brain Tumor Father    ? Seizures Father    ? Hypertension Brother    ? Diabetes Sister    ? High Cholesterol Sister    ? Hypertension Sister    ? Diabetes Brother    ? Tumor Sister    ? Cancer Sister 37        Brain, deceased   ? Tumor Sister 105        2 in Brain   ? Heart Disease Sister 49     Social History     Socioeconomic History   ? Marital status: Married   Tobacco Use   ? Smoking status: Former Smoker     Packs/day: 1.50     Years: 30.00     Pack years: 45.00     Types: Cigarettes     Quit date: 05/02/2012     Years since quitting: 9.1   ? Smokeless tobacco: Never Used   ? Tobacco comment: Currently Vapes   Vaping Use   ? Vaping Use: Former   ? Quit date: 05/14/2020   ? Substances: Nicotine   Substance and Sexual Activity   ? Alcohol use: No     Alcohol/week: 0.0 standard drinks   ? Drug use: No   ? Sexual activity: Not Currently     Birth control/protection: Surgical     Vaping/E-liquid Use   ? Vaping Use Former User    ? Quit Date 05/14/20    ? Comments cartridge lasts 1 per week 2 percent      Vaping/E-liquid Substances   ? Nicotine Yes    ? Flavored No              Your  Current Medications:       Instructions    biotin 5,000 mcg TbDi Dissolve 2 tablets by mouth twice daily.    buPROPion HCL (WELLBUTRIN) 75 mg tablet Take one tablet by mouth daily for 90 days. Indications: major depressive disorder    cholecalciferol (VITAMIN D-3) 1,000 units tablet Take 1,000 Units by mouth twice weekly.    clonazePAM (KLONOPIN) 0.5 mg tablet Take 1.5 tablets by mouth twice daily.    clonazePAM (KLONOPIN) 1 mg tablet Take one tablet by mouth as Needed (For driving).    cyanocobalamin (vitamin B-12) 2,500 mcg tab Take 1 tablet by mouth daily with breakfast.    diphenhydrAMINE HCL (BENADRYL) 12.5 mg/5 mL oral solution Take 12.5 mg by mouth every 6 hours as needed.    divalproex (DEPAKOTE EC) 250 mg DR tablet TAKE THREE TABLETS BY MOUTH DAILY. TAKE WITH FOOD.    famotidine (PEPCID) 20 mg tablet Take 20 mg by mouth daily as needed.    magnesium oxide (MAGOX) 400 mg (241.3 mg magnesium) tablet Take one tablet by mouth daily.    multivit, Ca, min-FA-soy isofl 400-60 mcg-mg tab Take 1 tablet by mouth twice daily.    mupirocin (CENTANY) 2 % topical ointment Apply  topically to affected area twice daily.    nebivoloL (BYSTOLIC) 5 mg tablet TAKE 1 TABLET BY MOUTH EVERY DAY    ondansetron (ZOFRAN ODT) 4 mg rapid dissolve tablet Dissolve two tablets by mouth every 8 hours as needed for Nausea or Vomiting. Place on tongue to dissolve.    ondansetron (ZOFRAN ODT) 4 mg rapid dissolve tablet Dissolve one tablet by mouth every 8 hours as needed. Place on tongue to disolve.    pantoprazole DR (PROTONIX) 40 mg tablet TAKE 1 TABLET BY MOUTH EVERY DAY    Potassium 99 mg tab Take 1 tablet by mouth three times weekly.    prochlorperazine maleate (COMPAZINE) 10 mg tablet Take one tablet by mouth every 6 hours as needed for Nausea or Vomiting.    riboflavin (vitamin B2) 400 mg tab Take one tablet by mouth daily.    rosuvastatin (CRESTOR) 20 mg tablet TAKE 1 TABLET BY MOUTH EVERY DAY    sertraline (ZOLOFT) 25 mg tablet Take one tablet by mouth daily. Please take with 50mg  tablets for total daily dose of 75mg     sertraline (ZOLOFT) 50 mg tablet Take one tablet by mouth daily. Please take with 25mg  tablet for total daily dose of 75mg     sumatriptan succinate (IMITREX) 100 mg tablet Take one tablet by mouth at onset of headache. May repeat after 2 hours if needed. Max of 200 mg in 24 hours.  Limit use to less than 10 tablets per month.    verapamil (CALAN) 40 mg tablet TAKE 1/2 TABLET BY MOUTH IN THE MORNING AND TAKE 1 TABLET IN THE EVENING vitamins, multi w/minerals 9 mg iron-400 mcg tab Take 1 tablet by mouth daily with breakfast.                    Physical Exam  General: Alert oriented no acute distress  Neuro: No focal deficits   HEENT: Sclerae are anicteric, oropharynx is clear, neck is supple  Cardiovascular: Heart sounds S1-S2 appreciable no murmur  Respiratory: Breathing is nonlabored, lung fields are clear to auscultation bilaterally  Abdomen: Abdomen is soft, nontender no rebound or guarding,   Extremities: No clubbing cyanosis or edema      Lab Results   Component Value Date  CHOL 134 06/16/2019    TRIG 107 06/16/2019    HDL 44 06/16/2019    LDL 69 06/16/2019    VLDL 21 06/16/2019    NONHDLCHOL 171 (H) 02/06/2015    CHOLHDLC 3 06/16/2019            Procedure: Anoscopy  Patient was placed in the lateral decubitus position,  A rectal examination was performed notable for mild discomfort with digital anorectal exam.  No palpable masses, negative for undue tenderness suggestive of any fistula.  A lighted anoscope was inserted circumferential view of the anal canal was obtained there were circumferential hemorrhoids no evidence of any mass, mass-effect or fissures mucosally-based masses.        Problems Addressed Today  Encounter Diagnoses   Name Primary?   ? Grade III hemorrhoids Yes                 We shared a decision to proceed with minimally invasive THD hemorrhoid procedure.  We have discussed 5 to 8% risk of recurrence which is influenced by hydration and adequate fiber and that I recommend targeting a goal of 25 g of fiber daily and 6 to 8 glasses water to help keep recurrence risks low.    I have personally documented the HPI, exam and medical decision making. Patient education: I reviewed recent lab results and current medications, medication instructions, I have instructed the patient on the plan of care and they verbalize understanding of the plan. Please see AVS for full patient teaching. Patient advised to call our office if s/he has any problems, questions, worsening symptoms, or concerns prior to the next appointment. Thanks for allowing me to see this nice patient. If I can be of additional assistance, please don't hesitate to contact me.     Thank you for coming to [facility].  Your instructions today:     1.  Medications: Over-the-counter Tylenol as needed  2.  Labs: None  3.  Next visit will be for postoperative visit  4.  Call for any change or worsening symptoms       Teddy Spike, MD, FACS, MBA.   Assistant Professor  [employer]      Total time 50 minutes.  Estimated counseling time 45 minutes.

## 2021-07-03 NOTE — Patient Instructions
Surgery:  08/05/21 with Dr. Larina Bras     Your surgery will be at: [facility].    [facility]   [address]  [place], [place] [address]  [phone]  Directions:  [facility] is located off [address] at the intersection of [address] and [address] in [place], [place].  Take the [address] exit and go east one block. Turn right (south) on [address], and drive one-half mile to [employer].  For driving directions by phone, call [phone].    -Do not eat or drink (including gum, mints, candy, or chewing tobacco) anything after midnight-12 am the night before your surgery.  -The morning of your surgery brush your teeth and tongue, ok to rinse, but do not drink any water.  -You may take your medications with a sip of water if instructed by your physician to do so.  -Please do not take diabetic medication the morning of your procedure unless otherwise instructed.   -If you are taking blood thinning medications such as Warafin/Coumadin, Clopidogrel/Plavix, or aspirins, please stop taking as instructed prior to the procedure if authorized by your prescribing physician.     -You will need to have someone drive you home.     Please call with any questions or concerns.  Scheduling [facility]) [phone]  Nurse line Dahlia Client, [facility]) [phone]  Fax [phone]      [facility] (Transanal Hemorrhoidal Dearterialization)   Hemorrhoids are a group of blood vessels in the anus and lower part of the rectum.  Although they are a natural part of our anatomy, over time they can become swollen, cause itching, bleeding and pain.  Swollen hemorrhoids can be cause by constipation, pregnancy, heredity, obesity, and straining during bowel movements among other factors.     Post-Operative Activity  A responsible adult must drive you home.  After receiving sedation or anesthesia, you should not drive, operate heavy machinery or do anything that requires concentration for at least 24 hours after receiving sedation.  The day after your procedure, walk before noon.  Walk 2 miles the next day before noon and continue this over the next 2 weeks    Post-Operative Site Care  Sit in a tub or basin of warm water 2x a day for the first 2 weeks after your procedure.   Is is normal to have a full sensation in the anal canal for 1 week after your procedure.   It is normal to see small amounts of blood in the stool for up to 2 weeks after your procedure.   DO NOT strain when trying to have a bowel movement, pressure is normal  If you are unable to urinate within 8hrs of your procedure, please go to the ER for urinary catheter placement  Diet  Immediately after surgery, you may begin with liquids and advance diet slowly.  Drink plenty of clear, non-caffeinated, non-alcoholic liquids.  Eat a diet high in fiber.     Home Medications  You may resume your home medications, unless the doctor has specifically asked that you stop a certain medication.    Pain Management  Pain medication may be prescribed during the post-operative period.  Please take medication as prescribed.  Do not take pain medication on an empty stomach.  No driving or operating heavy machinery while on pain medication.  Avoid alcohol while on pain medication.  Begin taking over the counter pain relievers after surgery.  Ibuprofen 800mg  and Tylenol 1000mg , take  3 times a day TOGETHER (about every 8hrs).  Continue this for 2 weeks, then     Please call nurse with the following symptoms [phone]:  If pain is not controlled with pain medication.  No Bowel movement in 4 days  Signs or symptoms of infection  Chills, body aches, fever greater than 100.5  Redness, Swelling, Warmth at or around incision sites  Purulent drainage from incision sites  If you feel you are having an emergency, do not hesitate to call 911    Follow up in clinic in 2 weeks for a post-operative evaluation with your physician.     Please complete a Fleets Enema the night before and morning of your Hemorrhoid procedure.    Fleets Enemas are available over the counter and do not require a prescription.    You can find these at any drug store.

## 2021-07-04 ENCOUNTER — Encounter: Admit: 2021-07-04 | Discharge: 2021-07-04 | Payer: PRIVATE HEALTH INSURANCE

## 2021-07-04 DIAGNOSIS — J449 Chronic obstructive pulmonary disease, unspecified: Secondary | ICD-10-CM

## 2021-07-04 DIAGNOSIS — I288 Other diseases of pulmonary vessels: Secondary | ICD-10-CM

## 2021-07-04 DIAGNOSIS — R519 Generalized headaches: Secondary | ICD-10-CM

## 2021-07-04 DIAGNOSIS — M1990 Unspecified osteoarthritis, unspecified site: Secondary | ICD-10-CM

## 2021-07-04 DIAGNOSIS — N3010 Interstitial cystitis (chronic) without hematuria: Secondary | ICD-10-CM

## 2021-07-04 DIAGNOSIS — E7800 Pure hypercholesterolemia, unspecified: Secondary | ICD-10-CM

## 2021-07-04 DIAGNOSIS — G4733 Obstructive sleep apnea (adult) (pediatric): Secondary | ICD-10-CM

## 2021-07-04 DIAGNOSIS — I10 Essential (primary) hypertension: Secondary | ICD-10-CM

## 2021-07-04 DIAGNOSIS — J302 Other seasonal allergic rhinitis: Secondary | ICD-10-CM

## 2021-07-04 DIAGNOSIS — F99 Mental disorder, not otherwise specified: Secondary | ICD-10-CM

## 2021-07-04 DIAGNOSIS — E785 Hyperlipidemia, unspecified: Secondary | ICD-10-CM

## 2021-07-04 DIAGNOSIS — M549 Dorsalgia, unspecified: Secondary | ICD-10-CM

## 2021-07-04 DIAGNOSIS — R0600 Dyspnea, unspecified: Secondary | ICD-10-CM

## 2021-07-04 DIAGNOSIS — T753XXA Motion sickness, initial encounter: Secondary | ICD-10-CM

## 2021-07-04 DIAGNOSIS — R079 Chest pain, unspecified: Secondary | ICD-10-CM

## 2021-07-04 DIAGNOSIS — R42 Dizziness and giddiness: Secondary | ICD-10-CM

## 2021-07-04 DIAGNOSIS — K859 Acute pancreatitis without necrosis or infection, unspecified: Secondary | ICD-10-CM

## 2021-07-04 DIAGNOSIS — Z136 Encounter for screening for cardiovascular disorders: Secondary | ICD-10-CM

## 2021-07-04 DIAGNOSIS — G5601 Carpal tunnel syndrome, right upper limb: Secondary | ICD-10-CM

## 2021-07-04 DIAGNOSIS — Z720 Tobacco use: Secondary | ICD-10-CM

## 2021-07-04 DIAGNOSIS — G43909 Migraine, unspecified, not intractable, without status migrainosus: Secondary | ICD-10-CM

## 2021-07-04 DIAGNOSIS — G44039 Episodic paroxysmal hemicrania, not intractable: Secondary | ICD-10-CM

## 2021-07-04 MED ORDER — VERAPAMIL 40 MG PO TAB
ORAL_TABLET | ORAL | 3 refills | 90.00000 days | Status: DC
Start: 2021-07-04 — End: 2021-07-04

## 2021-07-04 MED ORDER — ROSUVASTATIN 20 MG PO TAB
ORAL_TABLET | Freq: Every day | ORAL | 3 refills | 90.00000 days | Status: DC
Start: 2021-07-04 — End: 2021-07-04

## 2021-07-04 MED ORDER — NEBIVOLOL 5 MG PO TAB
5 mg | ORAL_TABLET | Freq: Every day | ORAL | 3 refills | 60.00000 days | Status: AC
Start: 2021-07-04 — End: ?

## 2021-07-04 MED ORDER — ROSUVASTATIN 20 MG PO TAB
20 mg | ORAL_TABLET | Freq: Every day | ORAL | 3 refills | 90.00000 days | Status: AC
Start: 2021-07-04 — End: ?

## 2021-07-04 MED ORDER — VERAPAMIL 40 MG PO TAB
ORAL_TABLET | ORAL | 3 refills | 90.00000 days | Status: AC
Start: 2021-07-04 — End: ?

## 2021-07-04 NOTE — Progress Notes
Date of Service: 07/04/2021    Christina Garrison is a 50 y.o. female.       HPI     Christina Garrison has been followed for difficult to control hypertension.? Her blood pressure has been very well controlled on her current antihypertensive regimen.  She underwent bilateral mammoplasty reduction, lateral chest wall liposuction and liposuction to the abdomen and flanks on July 23, 2020.  She indicated that she developed wound dehiscence and required a wound VAC with eventual wound healing.  On April 11, 2021 she underwent orthopedic surgery for right ulnar neuropathy at the elbow, right elbow medial epicondylitis, right ulnar neuropathy, right carpal tunnel syndrome, trigger finger of the right middle, ring and small finger and right thumb joint arthritis.  She reports that she is still rehabilitating from the surgery.  Christina Garrison indicates that she is being scheduled for hemorrhoid surgery.  In December 2020 she underwent a sleep study which was abnormal and she now uses CPAP. ?From a cardiovascular perspective, the patient has been doing well and reports no angina, congestive symptoms, palpitations, sensation of sustained forceful heart pounding, lightheadedness or syncope. She walks her dogs for a mile daily.  She now has 6 dogs. The patient reports no myalgias, bleeding abnormalities, claudication or strokelike symptoms. ?She previously had mylgias while taking pravastatin. 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 ?Her verapamil has been decreased from 40 mg twice daily to 20 mg in the morning and 40 mg in the evening because of bradycardia.?  ?  Historically regarding the treatment of her hypertension, Christina Garrison reports that an ACE inhibitor previously caused a cough and initially nebivolol was ineffective. At one time she was taking a thiazide diuretic, but this was stopped when it was thought to possibly have contributed to an episode of pancreatitis on October 30, 2010. Amlodipine 5 mg daily was started with good results. Nebivolol 5 mg daily was restarted and she has tolerated this without difficulty. Her past medical history is noteworthy for interstitial cystitis. Christina Garrison underwent laparoscopic cholecystectomy on 05/01/11 with resolution of her recurrent abdominal discomfort.?She reports chronic problems with recurrent sinus infections. 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 Christina Garrison reports that her surgery was complicated by fluid retention and pulmonary congestion. ?Christina Garrison is a prior cigarette smoker and had smoked a half-a-pack per day for 23 years. In September 2013 she stopped smoking.??She has hyperlipidemia, but is not known to be diabetic. She has a family history for coronary disease. In 02-19-10 a brother died suddenly at age 25. Her father is still living, but has coronary disease at age 26. Her mother had coronary disease. Her maternal grandfather died from coronary disease at age 39 and her maternal grandmother died from coronary disease, also age 30. One sister has lung cancer and 2 siblings have high blood pressure. No siblings have documented coronary disease, but as mentioned, her brother died suddenly in Feb 19, 2010 at age 105. ??She underwent open repair of supraumbilical hernia with mesh?on 09/23/2018 with excellent results.??On 06/03/2019 she received local pain injections for bilateral occipital neuralgia, myalgia, neuropathic pain, muscle spasm and myofascial pain. ?It appears that on June 04, 2019 she was seen for chest discomfort in [facility] and on?June 09, 2019 she  presented to [facility] hospital with chest discomfort. ?There was no evidence for an acute coronary syndrome and stress testing was performed which showed no evidence of myocardial ischemia. ?Her chest discomfort appeared?to be clearly musculoskeletal.  The patient underwent left carpal tunnel surgery on 10/21/2019 and right carpal tunnel surgery on 12/27/2019 with good results.          Vitals:    07/04/21 1553   BP: 116/78   BP Source: Arm, Left Upper   Pulse: 85   SpO2: 96%   O2 Device: None (Room air)   PainSc: Zero   Weight: 110 kg (242 lb 6.4 oz)   Height: 165.1 cm (5' 5)     Body mass index is 40.34 kg/m?Marland Kitchen     Past Medical History  Patient Active Problem List    Diagnosis Date Noted   ? 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 Trigger finger, right little finger 03/25/2021   ? Trigger finger, right ring finger 03/25/2021   ? Trigger finger, right middle finger 03/25/2021   ? Medial epicondylitis of elbow, right 03/25/2021   ? 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 Cubital tunnel syndrome on right 03/25/2021   ? Right carpal tunnel syndrome 03/25/2021   ? Wound dehiscence, surgical 11/14/2020   ? Migraine without aura and without status migrainosus, not intractable 10/11/2020     Left sided   1-2 a week as of 10/2020  + photophobia, nausea  On Depakote 500 mg DR, Doree Barthel, compazine     ? Cellulitis of breast 09/19/2020   ? Wound healing, delayed 09/19/2020   ? Macromastia 07/23/2020   ? Encounter for cosmetic surgery 05/02/2020   ? Localized adiposity 05/02/2020   ? Hypertrophy of breast 03/21/2020   ? Carpal tunnel syndrome on right 12/27/2019   ? Persistent depressive disorder 11/24/2019   ? Other specified anxiety disorders 11/24/2019   ? PTSD (post-traumatic stress disorder) 11/24/2019   ? Nicotine dependence 11/24/2019   ? Nicotine abuse 10/27/2019   ? Chest pain 06/09/2019   ? Bilateral arm weakness 05/27/2019     Describes neck manipulation at chiropractor starting in May, bilateral arm weakness/numbness/pain that began one week ago, and ongoing symptoms of bi-occipital, posterior neck pain moving anteriorly since June 2020      CTA head and neck with and without contrast negative for any dissection, large vessel stenosis    No longer endorsing these symptoms, but does state post L carpal tunnel release surgery, she's been having pain in her left wrist      ? Bilateral occipital neuralgia 05/27/2019     Describes bilateral neck/occipital region pain that moves anteriorly. Tenderness to palpation on bilateral trap>occipital grooves.     Currently being seen by chronic anesthesiology for nerve blocks      ? Primary insomnia 03/18/2018   ? Nightmare 11/26/2015   ? 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 Described as a jerking sensation lasting seconds.     MRI brain with and without contrast negative    Evaluation by neuro-otologist did not find any pathological findings    Continues to endorse some intermittent dizziness      ? Obstructive sleep apnea syndrome 05/21/2015     PSG on 03/16/15: AHI 8.9 events per hour,The REM AHI was 21.3, supine REM AHI 37.? The overall supine AHI was 16 and lateral AHI was 0. Oxyhemoglobin saturation nadir of 84% and Time below 88% was 12.8 minutes.  PLMS 0/hr   12/19/2015 oximetry on 2 Liters, <88% 6 minutes.  Increase to 3 liters and recheck.  Much better headaches.     ? Obesity (BMI 30-39.9) 05/21/2015     Body mass index is 30.71 kg/(m^2).     ? Chronic insomnia 05/21/2015     - trouble with sleep maintenance   - Components of psychophysiologic insomnia and anxiety  - Has not used clonazepam for this, had leg cramps with lunesta  - poor sleep hygiene     Follows with Dr. Andria Meuse, currently non compliant with CPAP but does use supplemental oxygen overnight and being worked up for adequate use      ? Sleep disorder 11/09/2014   ? Hyperlipidemia 08/26/2012   ? Vertigo 08/11/2012     Spells for years of intermittent dizziness, rarely with vertigo but now described more in terms of altered cognition., initially positionally provoked, now lasting for only seconds.  Occurs daily currently.  Initially with feeling of L ear fullness and pain.  Has been associated with headaches as well  Does have a h/o falling down stairs hitting her head and neck 20 years ago  MRI wo/w + IAC 10/2012 without significant abnormalities  Neuro-otology workup with likely central cause of dizziness (normal audiogram and mostly normal VEMPs, normal electronystagmography, no canal dehiscence on imaging)  Has undergone PT for vestibular and cervical causes  EEG (routine) normal    Current treatment: Clonazepam 1mg  qAM, qPM, Depakote 250 BID (for mood), Acupuncture (has had some response)    Prior trials: Prozac (SE), Effexor (suicidal thoughts), Topamax (speech difficulties), nortriptyline, Indomethacin (helped headache but GI SE), Magnesium/Riboflavin/coQ10, Propranolol (changed to Bystolic), Verapamil.  Occipital block with poor tolerance due to lasting pain at injection site     ? Tension headache 08/11/2012     frontal/L eye pressure since her 30s with conjunctival injection/photo/phono.  Able to work through this without any medication.  Sleeps in positions that provoke neck/back pain.  Significant greater occipital tenderness on exam  Improved with treatment of OSA, and after occipital nerve blocks (though poorly tolerated as above)  History of frontal meningocele (resected), head trauma (fell down stairs)    Currently taking a swig of liquid tylenol x2 a week to help treat. Also taking migraine cocktail for it with mild improvement      ? Allergic rhinitis 03/29/2012     Pt is a 50 yo F who presents to ENT clinic for evaluation of her sinus allergies.  Patient states that she has a 1 year history of sinus pressure and congestion.  Usual she has congestion behind her eyes and in her cheeks.  + Facial pain.  Symptoms are worse in spring and summer.  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  Patient has taken claritin however it makes her dizzy. She is currently doing daily sinus rinses as well as flonase.  Currently she feels her sinuses are not that bad.    Patient has secondary complaint of dizziness for 1 year.  It is an off balance sensation when she is driving or bending over.  Lasts only momentarily. No sensation of the room spinning. No hearing loss, tinnitus. Some L ear fullness.     ? Chest pain of uncertain etiology 11/29/2010   ? Pancreatitis 11/21/2010     2/12 Hospitalization [facility].     ? Essential hypertension 11/21/2010         Review of Systems  Constitutional: Negative.   HENT: Negative.    Eyes: Negative.    Cardiovascular: Negative.    Respiratory: Negative.    Endocrine: Negative.    Hematologic/Lymphatic: Negative.    Skin: Negative.    Musculoskeletal: Negative.    Gastrointestinal: Negative.    Genitourinary: Negative.    Neurological: Negative.    Psychiatric/Behavioral: Negative.    Allergic/Immunologic: Negative.        Physical Exam  GENERAL: The patient is well developed, well nourished, resting comfortably and in no distress. ?  HEENT: No abnormalities of the visible oro-nasopharynx, conjunctiva or sclera are noted.  NECK: There is no jugular venous distension. Carotids are palpable and without bruits. There is no thyroid enlargement.  Chest: Lung fields are clear to auscultation. There are no wheezes or crackles.??  CV: There is a regular rhythm. The first and second heart sounds are normal. There are no murmurs, gallops or rubs. Her apical heart rate is?64?BPM.  ABD: The abdomen is soft and supple with normal bowel sounds. There is no hepatosplenomegaly, ascites, tenderness, masses or bruits.  Neuro: There are no focal motor defects. Ambulation is normal. Cognitive function appears normal.  Ext:?There is no edema or evidence of deep vein thrombosis. Peripheral pulses are satisfactory. ?She is wearing a left wrist brace.  SKIN:?There are no rashes and no cellulitis  PSYCH:?The patient is calm, rationale and oriented    Cardiovascular Studies  A twelve-lead ECG obtained on 07/04/2021 reveals normal sinus rhythm with a heart rate of 72 bpm.  Low voltage in the precordial leads is noted.  There is no evidence of myocardial ischemia or infarction.  an echo Doppler study was obtained on 06/21/2020 revealed:  Interpretation Summary  ?  ? The left ventricular size, wall thickness and systolic function are normal. The ejection fraction by Simpson's biplane method is 60%. Normal left ventricular diastolic function.  ?   Echocardiographic Findings  ?  Left Ventricle The left ventricular size, wall thickness and systolic function are normal. The ejection fraction by Simpson's biplane method is 60%. Normal left ventricular diastolic function.   Right Ventricle The right ventricular size is normal. The right ventricular systolic function is normal.   Left Atrium Normal size.   Right Atrium Normal size.   IVC/SVC Normal central venous pressure (0-5 mm Hg).   Mitral Valve Normal valve structure. No stenosis. Trace regurgitation.   Tricuspid Valve Normal valve structure. No stenosis. Trace regurgitation.   Aortic Valve The valve has focal thickening. No stenosis. No regurgitation.   Pulmonary The pulmonic valve was not seen well but no Doppler evidence of stenosis. Normal valve structure.   Aorta Normal aorta. The aortic root and ascending aorta are normal in size.   Pericardium Pericardium is normal.     PET/CT stress study 06/10/2019:  Normal pharmacologic stress myocardial perfusion, flow quantification, and   function.   No significant coronary calcification.     Cardiovascular Health Factors  Vitals BP Readings from Last 3 Encounters:   07/04/21 116/78   07/03/21 138/72   06/12/21 122/72     Wt Readings from Last 3 Encounters:   07/04/21 110 kg (242 lb 6.4 oz)   07/03/21 109.7 kg (241 lb 12.8 oz)   06/12/21 86.2 kg (190 lb)     BMI Readings from Last 3 Encounters:   07/04/21 40.34 kg/m?   07/03/21 40.24 kg/m? 06/12/21 31.62 kg/m?      Smoking Social History     Tobacco Use   Smoking  Status Former Smoker   ? Packs/day: 1.50   ? Years: 30.00   ? Pack years: 45.00   ? Types: Cigarettes   ? Quit date: 05/02/2012   ? Years since quitting: 9.1   Smokeless Tobacco Never Used   Tobacco Comment    Currently Vapes      Lipid Profile Cholesterol   Date Value Ref Range Status   06/16/2019 134  Final     HDL   Date Value Ref Range Status   06/16/2019 44  Final     LDL   Date Value Ref Range Status   06/16/2019 69  Final     Triglycerides   Date Value Ref Range Status   06/16/2019 107  Final      Blood Sugar Hemoglobin A1C   Date Value Ref Range Status   07/02/2011 5.9  Final     Glucose   Date Value Ref Range Status   10/10/2020 74 70 - 100 MG/DL Final   16/06/9603 73 70 - 100 MG/DL Final   54/05/8118 76 70 - 100 MG/DL Final          Problems Addressed Today  Encounter Diagnoses   Name Primary?   ? Screening for heart disease Yes   ? Other diseases of pulmonary vessels (HCC)    ? Essential hypertension    ? Chest pain of uncertain etiology    ? Hyperlipidemia, unspecified hyperlipidemia type    ? Chest pain, unspecified type    Hypertension.  Hypercholesterolemia.    Assessment and Plan     Ms. Joung?appears stable from a cardiovascular perspective.  She reports no chest discomfort or congestive symptoms. ?Otherwise, her blood pressure and lipid profile appear to be under very good control.  She was given a requisition to check her fasting lipid profile, ALT and Chem-7.  I have asked her to return for follow-up in?12?months. The total time spent during this interview and exam was 30 minutes..  Concerning her hemorrhoid surgery: Based upon the results of the patient's clinical profile and non-invasive cardiovascular testing, the risk for cardiovascular morbidity and mortality associated with non-cardiac surgery is INTERMEDIATE. This by no means precludes surgery but provides information for the surgeon, anesthesiologist and patient concerning the risks of surgery.  A decision to proceed with surgery should be made based upon the relative benefits and risks involved. Indeed, hemorrhoid surgery could be a very helpful intervention for this patient's overall health maintenance. There are no absolute contra-indications to elective surgery, as deemed necessary. No additional cardiovascular testing is required at this time         Current Medications (including today's revisions)  ? biotin 5,000 mcg TbDi Dissolve 2 tablets by mouth twice daily.   ? buPROPion HCL (WELLBUTRIN) 75 mg tablet Take one tablet by mouth daily for 90 days. Indications: major depressive disorder   ? cholecalciferol (VITAMIN D-3) 1,000 units tablet Take 1,000 Units by mouth twice weekly.   ? clonazePAM (KLONOPIN) 0.5 mg tablet Take 1.5 tablets by mouth twice daily.   ? clonazePAM (KLONOPIN) 1 mg tablet Take one tablet by mouth as Needed (For driving).   ? cyanocobalamin (vitamin B-12) 2,500 mcg tab Take 1 tablet by mouth daily with breakfast.   ? diphenhydrAMINE HCL (BENADRYL) 12.5 mg/5 mL oral solution Take 12.5 mg by mouth every 6 hours as needed.   ? divalproex (DEPAKOTE EC) 250 mg DR tablet TAKE THREE TABLETS BY MOUTH DAILY. TAKE WITH FOOD.   ? famotidine (PEPCID)  20 mg tablet Take 20 mg by mouth daily as needed.   ? magnesium oxide (MAGOX) 400 mg (241.3 mg magnesium) tablet Take one tablet by mouth daily.   ? multivit, Ca, min-FA-soy isofl 400-60 mcg-mg tab Take 1 tablet by mouth twice daily.   ? mupirocin (CENTANY) 2 % topical ointment Apply  topically to affected area twice daily.   ? nebivoloL (BYSTOLIC) 5 mg tablet Take one tablet by mouth daily.   ? ondansetron (ZOFRAN ODT) 4 mg rapid dissolve tablet Dissolve two tablets by mouth every 8 hours as needed for Nausea or Vomiting. Place on tongue to dissolve.   ? ondansetron (ZOFRAN ODT) 4 mg rapid dissolve tablet Dissolve one tablet by mouth every 8 hours as needed. Place on tongue to disolve.   ? pantoprazole DR (PROTONIX) 40 mg tablet TAKE 1 TABLET BY MOUTH EVERY DAY (Patient taking differently: Take 40 mg by mouth as Needed.)   ? Potassium 99 mg tab Take 1 tablet by mouth three times weekly.   ? prochlorperazine maleate (COMPAZINE) 10 mg tablet Take one tablet by mouth every 6 hours as needed for Nausea or Vomiting.   ? riboflavin (vitamin B2) 400 mg tab Take one tablet by mouth daily.   ? rosuvastatin (CRESTOR) 20 mg tablet Take one tablet by mouth daily.   ? sertraline (ZOLOFT) 25 mg tablet Take one tablet by mouth daily. Please take with 50mg  tablets for total daily dose of 75mg    ? sertraline (ZOLOFT) 50 mg tablet Take one tablet by mouth daily. Please take with 25mg  tablet for total daily dose of 75mg    ? sumatriptan succinate (IMITREX) 100 mg tablet Take one tablet by mouth at onset of headache. May repeat after 2 hours if needed. Max of 200 mg in 24 hours.  Limit use to less than 10 tablets per month.   ? verapamil (CALAN) 40 mg tablet Take 1/2 tablet by mouth in the morning and 1 tablet in the evening   ? vitamins, multi w/minerals 9 mg iron-400 mcg tab Take 1 tablet by mouth daily with breakfast.

## 2021-07-12 ENCOUNTER — Encounter: Admit: 2021-07-12 | Discharge: 2021-07-12 | Payer: PRIVATE HEALTH INSURANCE

## 2021-07-12 MED ORDER — BUPROPION HCL 75 MG PO TAB
75 mg | ORAL_TABLET | Freq: Every day | ORAL | 1 refills | Status: AC
Start: 2021-07-12 — End: ?

## 2021-07-15 ENCOUNTER — Encounter: Admit: 2021-07-15 | Discharge: 2021-07-15 | Payer: PRIVATE HEALTH INSURANCE

## 2021-07-15 MED ORDER — SERTRALINE 25 MG PO TAB
25 mg | ORAL_TABLET | Freq: Every day | ORAL | 3 refills | Status: AC
Start: 2021-07-15 — End: ?

## 2021-07-15 NOTE — Telephone Encounter
Last office visit 04/29/21.  Upcoming appointment 08/01/21.  Refill request completed per protocol.   - Continue sertraline 75mg  qday for mood/anxiety.

## 2021-07-23 ENCOUNTER — Encounter: Admit: 2021-07-23 | Discharge: 2021-07-23 | Payer: PRIVATE HEALTH INSURANCE

## 2021-07-23 DIAGNOSIS — F99 Mental disorder, not otherwise specified: Secondary | ICD-10-CM

## 2021-07-23 DIAGNOSIS — Z720 Tobacco use: Secondary | ICD-10-CM

## 2021-07-23 DIAGNOSIS — E7800 Pure hypercholesterolemia, unspecified: Secondary | ICD-10-CM

## 2021-07-23 DIAGNOSIS — N3010 Interstitial cystitis (chronic) without hematuria: Secondary | ICD-10-CM

## 2021-07-23 DIAGNOSIS — K859 Acute pancreatitis without necrosis or infection, unspecified: Secondary | ICD-10-CM

## 2021-07-23 DIAGNOSIS — J449 Chronic obstructive pulmonary disease, unspecified: Secondary | ICD-10-CM

## 2021-07-23 DIAGNOSIS — M549 Dorsalgia, unspecified: Secondary | ICD-10-CM

## 2021-07-23 DIAGNOSIS — J302 Other seasonal allergic rhinitis: Secondary | ICD-10-CM

## 2021-07-23 DIAGNOSIS — R0600 Dyspnea, unspecified: Secondary | ICD-10-CM

## 2021-07-23 DIAGNOSIS — T753XXA Motion sickness, initial encounter: Secondary | ICD-10-CM

## 2021-07-23 DIAGNOSIS — G4733 Obstructive sleep apnea (adult) (pediatric): Secondary | ICD-10-CM

## 2021-07-23 DIAGNOSIS — G5601 Carpal tunnel syndrome, right upper limb: Secondary | ICD-10-CM

## 2021-07-23 DIAGNOSIS — G44039 Episodic paroxysmal hemicrania, not intractable: Secondary | ICD-10-CM

## 2021-07-23 DIAGNOSIS — G43909 Migraine, unspecified, not intractable, without status migrainosus: Secondary | ICD-10-CM

## 2021-07-23 DIAGNOSIS — I10 Essential (primary) hypertension: Secondary | ICD-10-CM

## 2021-07-23 DIAGNOSIS — R42 Dizziness and giddiness: Secondary | ICD-10-CM

## 2021-07-23 DIAGNOSIS — R519 Generalized headaches: Secondary | ICD-10-CM

## 2021-07-23 DIAGNOSIS — M1990 Unspecified osteoarthritis, unspecified site: Secondary | ICD-10-CM

## 2021-07-31 ENCOUNTER — Encounter: Admit: 2021-07-31 | Discharge: 2021-07-31 | Payer: PRIVATE HEALTH INSURANCE

## 2021-07-31 ENCOUNTER — Ambulatory Visit: Admit: 2021-07-31 | Discharge: 2021-07-31 | Payer: Commercial Managed Care - HMO

## 2021-07-31 DIAGNOSIS — M549 Dorsalgia, unspecified: Secondary | ICD-10-CM

## 2021-07-31 DIAGNOSIS — N3010 Interstitial cystitis (chronic) without hematuria: Secondary | ICD-10-CM

## 2021-07-31 DIAGNOSIS — J302 Other seasonal allergic rhinitis: Secondary | ICD-10-CM

## 2021-07-31 DIAGNOSIS — R519 Generalized headaches: Secondary | ICD-10-CM

## 2021-07-31 DIAGNOSIS — Z720 Tobacco use: Secondary | ICD-10-CM

## 2021-07-31 DIAGNOSIS — G43909 Migraine, unspecified, not intractable, without status migrainosus: Secondary | ICD-10-CM

## 2021-07-31 DIAGNOSIS — G4733 Obstructive sleep apnea (adult) (pediatric): Secondary | ICD-10-CM

## 2021-07-31 DIAGNOSIS — E7800 Pure hypercholesterolemia, unspecified: Secondary | ICD-10-CM

## 2021-07-31 DIAGNOSIS — R42 Dizziness and giddiness: Secondary | ICD-10-CM

## 2021-07-31 DIAGNOSIS — G5601 Carpal tunnel syndrome, right upper limb: Secondary | ICD-10-CM

## 2021-07-31 DIAGNOSIS — T753XXA Motion sickness, initial encounter: Secondary | ICD-10-CM

## 2021-07-31 DIAGNOSIS — J449 Chronic obstructive pulmonary disease, unspecified: Secondary | ICD-10-CM

## 2021-07-31 DIAGNOSIS — K859 Acute pancreatitis without necrosis or infection, unspecified: Secondary | ICD-10-CM

## 2021-07-31 DIAGNOSIS — I10 Essential (primary) hypertension: Secondary | ICD-10-CM

## 2021-07-31 DIAGNOSIS — R0600 Dyspnea, unspecified: Secondary | ICD-10-CM

## 2021-07-31 DIAGNOSIS — M1990 Unspecified osteoarthritis, unspecified site: Secondary | ICD-10-CM

## 2021-07-31 DIAGNOSIS — G44039 Episodic paroxysmal hemicrania, not intractable: Secondary | ICD-10-CM

## 2021-07-31 DIAGNOSIS — F99 Mental disorder, not otherwise specified: Secondary | ICD-10-CM

## 2021-07-31 DIAGNOSIS — M65341 Trigger finger, right ring finger: Principal | ICD-10-CM

## 2021-07-31 MED ORDER — LIDOCAINE-EPINEPHRINE 1 %-1:100,000 IJ SOLN
1 mL | Freq: Once | 0 refills | Status: CP
Start: 2021-07-31 — End: ?
  Administered 2021-07-31: 20:00:00 1 mL

## 2021-07-31 MED ORDER — TRIAMCINOLONE ACETONIDE 40 MG/ML IJ SUSP
40 mg | Freq: Once | INTRAMUSCULAR | 0 refills | Status: CP
Start: 2021-07-31 — End: ?
  Administered 2021-07-31: 20:00:00 40 mg via INTRAMUSCULAR

## 2021-07-31 NOTE — Progress Notes
Subjective:       Patient struggling with a fair bit of pain and difficulty with range of motion still.    Christina Garrison is a 50 y.o. female.       Review of Systems   Constitutional: Negative.    HENT: Negative.    Eyes: Negative.    Respiratory: Negative.    Cardiovascular: Negative.    Gastrointestinal: Negative.    Endocrine: Negative.    Genitourinary: Negative.    Musculoskeletal: Negative.    Skin: Negative.    Allergic/Immunologic: Negative.    Neurological: Negative.    Hematological: Negative.    Psychiatric/Behavioral: Negative.          Objective:         ? biotin 5,000 mcg TbDi Dissolve 2 tablets by mouth twice daily.   ? buPROPion HCL (WELLBUTRIN) 75 mg tablet Take one tablet by mouth daily. Indications: major depressive disorder   ? cholecalciferol (VITAMIN D-3) 1,000 units tablet Take 1,000 Units by mouth twice weekly.   ? clonazePAM (KLONOPIN) 0.5 mg tablet Take 1.5 tablets by mouth twice daily.   ? clonazePAM (KLONOPIN) 1 mg tablet Take one tablet by mouth as Needed (For driving).   ? cyanocobalamin (vitamin B-12) 2,500 mcg tab Take 1 tablet by mouth daily with breakfast.   ? diphenhydrAMINE HCL (BENADRYL) 12.5 mg/5 mL oral solution Take 12.5 mg by mouth every 6 hours as needed.   ? divalproex (DEPAKOTE EC) 250 mg DR tablet TAKE THREE TABLETS BY MOUTH DAILY. TAKE WITH FOOD.   ? famotidine (PEPCID) 20 mg tablet Take 20 mg by mouth daily as needed.   ? magnesium oxide (MAGOX) 400 mg (241.3 mg magnesium) tablet Take one tablet by mouth daily.   ? multivit, Ca, min-FA-soy isofl 400-60 mcg-mg tab Take 1 tablet by mouth twice daily.   ? mupirocin (CENTANY) 2 % topical ointment Apply  topically to affected area twice daily.   ? nebivoloL (BYSTOLIC) 5 mg tablet Take one tablet by mouth daily.   ? ondansetron (ZOFRAN ODT) 4 mg rapid dissolve tablet Dissolve two tablets by mouth every 8 hours as needed for Nausea or Vomiting. Place on tongue to dissolve.   ? ondansetron (ZOFRAN ODT) 4 mg rapid dissolve tablet Dissolve one tablet by mouth every 8 hours as needed. Place on tongue to disolve.   ? pantoprazole DR (PROTONIX) 40 mg tablet TAKE 1 TABLET BY MOUTH EVERY DAY (Patient taking differently: Take 40 mg by mouth as Needed.)   ? Potassium 99 mg tab Take 1 tablet by mouth three times weekly.   ? prochlorperazine maleate (COMPAZINE) 10 mg tablet Take one tablet by mouth every 6 hours as needed for Nausea or Vomiting.   ? riboflavin (vitamin B2) 400 mg tab Take one tablet by mouth daily.   ? rosuvastatin (CRESTOR) 20 mg tablet Take one tablet by mouth daily.   ? sertraline (ZOLOFT) 25 mg tablet Take one tablet by mouth daily. Please take with 50mg  tablets for total daily dose of 75mg    ? sertraline (ZOLOFT) 50 mg tablet Take one tablet by mouth daily. Please take with 25mg  tablet for total daily dose of 75mg    ? sumatriptan succinate (IMITREX) 100 mg tablet Take one tablet by mouth at onset of headache. May repeat after 2 hours if needed. Max of 200 mg in 24 hours.  Limit use to less than 10 tablets per month.   ? verapamil (CALAN) 40 mg tablet Take 1/2 tablet by  mouth in the morning and 1 tablet in the evening   ? vitamins, multi w/minerals 9 mg iron-400 mcg tab Take 1 tablet by mouth daily with breakfast.     There were no vitals filed for this visit.  There is no height or weight on file to calculate BMI.     Physical Exam  Right upper extremity, ring finger with some tethering during passive extension, cannot get into complete extension at MCP joint compared to the other fingers.       Assessment and Plan:  Right ring finger pain  Explained that this is part of what we are trying to avoid by getting her working in therapy early after surgery.  Unfortunately, there were issues with finding good therapy for postoperative care.  She now has a fair bit of tethering of her flexor tendons which seems to be a significant part of her problem.  Discussed trying cortisone injection in the ring finger flexor sheath to see if we can help calm things down.  She was to proceed.    New therapy order given    Procedure:  right ring trigger finger cortisone injection/injection of flexor tendon sheath  Diagnosis:  right ring finger trigger finger  Consent obtained, timeout done, skin prepped with CHG  1 mL 1% lidocaine infiltrated for local anesthesia  1 mL kenalog 40 injected into sheath  Pt tol well

## 2021-08-01 ENCOUNTER — Ambulatory Visit: Admit: 2021-08-01 | Discharge: 2021-08-01 | Payer: Commercial Managed Care - HMO

## 2021-08-01 ENCOUNTER — Encounter: Admit: 2021-08-01 | Discharge: 2021-08-01 | Payer: PRIVATE HEALTH INSURANCE

## 2021-08-01 DIAGNOSIS — F341 Dysthymic disorder: Secondary | ICD-10-CM

## 2021-08-01 DIAGNOSIS — F4310 Post-traumatic stress disorder, unspecified: Secondary | ICD-10-CM

## 2021-08-01 MED ORDER — BUPROPION HCL 100 MG PO TAB
100 mg | ORAL_TABLET | Freq: Every day | ORAL | 1 refills | Status: AC
Start: 2021-08-01 — End: ?

## 2021-08-01 NOTE — Progress Notes
ATTENDING NOTE  I discussed Christina Garrison with Samella Parr, MD and concur with the assessment and treatment plan. Patient is 50 y.o. female with MDD, GAD and PTSD. Psychiatric symptoms well controlled at today's encounter. Denies SI/HI and AVH and no other safety concerns. Pt reports no medication side effects.    PLAN:  The following medication changes were made during this visit to better treat the above symptoms:  1. Continue Klonopin 1.5mg  PO BID and 1mg  PO Daily PRN  2. Continue Zoloft 75mg  PO Daily  3. Continue Depakote DR 250mg  PO QAM and 500mg  PO QHS  4. Increase Wellbutrin IR to 100mg  PO Daily   5. Consider Amitriptyline   6. Recommend Psychotherapy  7. No labs needed    ? biotin 5,000 mcg TbDi Dissolve 2 tablets by mouth twice daily.   ? cholecalciferol (VITAMIN D-3) 1,000 units tablet Take 1,000 Units by mouth twice weekly.   ? clonazePAM (KLONOPIN) 0.5 mg tablet Take 1.5 tablets by mouth twice daily.   ? clonazePAM (KLONOPIN) 1 mg tablet Take one tablet by mouth as Needed (For driving).   ? cyanocobalamin (vitamin B-12) 2,500 mcg tab Take 1 tablet by mouth daily with breakfast.   ? diphenhydrAMINE HCL (BENADRYL) 12.5 mg/5 mL oral solution Take 12.5 mg by mouth every 6 hours as needed.   ? divalproex (DEPAKOTE EC) 250 mg DR tablet TAKE THREE TABLETS BY MOUTH DAILY. TAKE WITH FOOD.   ? famotidine (PEPCID) 20 mg tablet Take 20 mg by mouth daily as needed.   ? magnesium oxide (MAGOX) 400 mg (241.3 mg magnesium) tablet Take one tablet by mouth daily.   ? multivit, Ca, min-FA-soy isofl 400-60 mcg-mg tab Take 1 tablet by mouth twice daily.   ? mupirocin (CENTANY) 2 % topical ointment Apply  topically to affected area twice daily.   ? nebivoloL (BYSTOLIC) 5 mg tablet Take one tablet by mouth daily.   ? ondansetron (ZOFRAN ODT) 4 mg rapid dissolve tablet Dissolve two tablets by mouth every 8 hours as needed for Nausea or Vomiting. Place on tongue to dissolve.   ? ondansetron (ZOFRAN ODT) 4 mg rapid dissolve tablet Dissolve one tablet by mouth every 8 hours as needed. Place on tongue to disolve.   ? pantoprazole DR (PROTONIX) 40 mg tablet TAKE 1 TABLET BY MOUTH EVERY DAY (Patient taking differently: Take 40 mg by mouth as Needed.)   ? Potassium 99 mg tab Take 1 tablet by mouth three times weekly.   ? prochlorperazine maleate (COMPAZINE) 10 mg tablet Take one tablet by mouth every 6 hours as needed for Nausea or Vomiting.   ? riboflavin (vitamin B2) 400 mg tab Take one tablet by mouth daily.   ? rosuvastatin (CRESTOR) 20 mg tablet Take one tablet by mouth daily.   ? sertraline (ZOLOFT) 25 mg tablet Take one tablet by mouth daily. Please take with 50mg  tablets for total daily dose of 75mg    ? sertraline (ZOLOFT) 50 mg tablet Take one tablet by mouth daily. Please take with 25mg  tablet for total daily dose of 75mg    ? sumatriptan succinate (IMITREX) 100 mg tablet Take one tablet by mouth at onset of headache. May repeat after 2 hours if needed. Max of 200 mg in 24 hours.  Limit use to less than 10 tablets per month.   ? verapamil (CALAN) 40 mg tablet Take 1/2 tablet by mouth in the morning and 1 tablet in the evening   ? vitamins, multi w/minerals 9 mg  iron-400 mcg tab Take 1 tablet by mouth daily with breakfast.       Rae Mar, MD  08/01/2021

## 2021-08-01 NOTE — Patient Instructions
It was nice to see you in clinic today!    If you have any questions, you may reach the clinic by calling [phone] or, if you have MyChart access, can message me through your chart.    Brief Summary of today's visit:  I will send a message to your neurology team about amitriptyline. The risk of serotonin syndrome is always present with multiple serotonin medications but with your regimen is overall low.  We'll increase your Wellbutrin to 100mg  daily today. This should balance out your symptoms without resulting in the irritability you were experiencing before.  No other medication changes today. Start thinking about therapy options and what it is you might want to be different in order to better guide what it is you are wanting to know. Sometimes answers lead to the truth, and other times they lead to more questions.    We will see you back in about 3 months for follow up.    Please call the clinic at [phone] to reschedule or cancel appointments if a conflict with your schedule arises. For all appointments, please arrive 15 minutes prior to the start of your visit to facilitate rooming and other necessary check-in procedures.    Please do not hesitate to call the clinic or contact us via MyChart if you have a change in your symptoms, if you have questions regarding your psychiatric medications, or if you start to develop side effects to medications prescribed by your psychiatrist. You can activate your MyChart account by visiting the website at [email or web address].     - Dr. Robb Matar    Our clinic is dedicated to making sure your prescriptions are filled in a timely manner during regular business hours (8am-5pm).  If you need a medication refill, please call your pharmacy to request a refill.  Please make sure to request refills at least 72 hours in advance of your last dose.  Your pharmacy will reach out to [place] electronically, which is the preferred method.  Please try to refrain from paging the on-call psychiatrist regarding medication refills (including controlled substances), as you may not receive a refill or a full refill at that time.    [facility] provides its patients with 24/7 care. If you are concerned about an impending psychiatric emergency (i.e. if you have any concerns about risk of harm to yourself, risk of harm to others or feel unsure about your ability to care for yourself), you may reach out to the overnight psychiatrist on-call by calling the main [facility] operator line outside of normal business hours ([phone]). If you are unsure if you are experiencing a psychiatric emergency, please ask the operator to page the on-call psychiatrist for clarification.    In the event of a safety concern or suicidal thoughts, call 911 or present to the nearest hospital emergency department. National Suicide Prevention Lifeline [phone] (TALK), or you may also dial 988. The Crisis Text Hotline (text 603-303-6072) is always available by SMS/Text. The [facility] phone line is a resource for talk support in non-emergency situations (1-866-WARMEAR).
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
## 2021-08-04 ENCOUNTER — Encounter: Admit: 2021-08-04 | Discharge: 2021-08-04 | Payer: PRIVATE HEALTH INSURANCE

## 2021-08-05 ENCOUNTER — Encounter: Admit: 2021-08-05 | Discharge: 2021-08-05 | Payer: PRIVATE HEALTH INSURANCE

## 2021-08-05 ENCOUNTER — Ambulatory Visit: Admit: 2021-08-05 | Discharge: 2021-08-05 | Payer: PRIVATE HEALTH INSURANCE

## 2021-08-05 MED ORDER — MIDAZOLAM 1 MG/ML IJ SOLN
INTRAVENOUS | 0 refills | Status: DC
Start: 2021-08-05 — End: 2021-08-05

## 2021-08-05 MED ORDER — GLYCOPYRROLATE 0.2 MG/ML IJ SOLN
INTRAVENOUS | 0 refills | Status: DC
Start: 2021-08-05 — End: 2021-08-05

## 2021-08-05 MED ORDER — SUCCINYLCHOLINE CHLORIDE 20 MG/ML IJ SOLN
INTRAVENOUS | 0 refills | Status: DC
Start: 2021-08-05 — End: 2021-08-05

## 2021-08-05 MED ORDER — LIDOCAINE (PF) 200 MG/10 ML (2 %) IJ SYRG
INTRAVENOUS | 0 refills | Status: DC
Start: 2021-08-05 — End: 2021-08-05

## 2021-08-05 MED ORDER — PHENYLEPHRINE HCL IN 0.9% NACL 1 MG/10 ML (100 MCG/ML) IV SYRG
INTRAVENOUS | 0 refills | Status: DC
Start: 2021-08-05 — End: 2021-08-05

## 2021-08-05 MED ORDER — NEOSTIGMINE METHYLSULFATE 1 MG/ML IJ SOLN
INTRAVENOUS | 0 refills | Status: DC
Start: 2021-08-05 — End: 2021-08-05

## 2021-08-05 MED ORDER — FENTANYL CITRATE (PF) 50 MCG/ML IJ SOLN
INTRAVENOUS | 0 refills | Status: DC
Start: 2021-08-05 — End: 2021-08-05

## 2021-08-05 MED ORDER — PROPOFOL INJ 10 MG/ML IV VIAL
INTRAVENOUS | 0 refills | Status: DC
Start: 2021-08-05 — End: 2021-08-05

## 2021-08-05 MED ORDER — ARTIFICIAL TEARS (PF) SINGLE DOSE DROPS GROUP
OPHTHALMIC | 0 refills | Status: DC
Start: 2021-08-05 — End: 2021-08-05

## 2021-08-05 MED ORDER — ROCURONIUM 10 MG/ML IV SOLN
INTRAVENOUS | 0 refills | Status: DC
Start: 2021-08-05 — End: 2021-08-05

## 2021-08-05 MED ORDER — ONDANSETRON HCL (PF) 4 MG/2 ML IJ SOLN
INTRAVENOUS | 0 refills | Status: DC
Start: 2021-08-05 — End: 2021-08-05

## 2021-08-06 ENCOUNTER — Encounter: Admit: 2021-08-06 | Discharge: 2021-08-06 | Payer: PRIVATE HEALTH INSURANCE

## 2021-08-06 DIAGNOSIS — J302 Other seasonal allergic rhinitis: Secondary | ICD-10-CM

## 2021-08-06 DIAGNOSIS — J449 Chronic obstructive pulmonary disease, unspecified: Secondary | ICD-10-CM

## 2021-08-06 DIAGNOSIS — R42 Dizziness and giddiness: Secondary | ICD-10-CM

## 2021-08-06 DIAGNOSIS — G44039 Episodic paroxysmal hemicrania, not intractable: Secondary | ICD-10-CM

## 2021-08-06 DIAGNOSIS — N3010 Interstitial cystitis (chronic) without hematuria: Secondary | ICD-10-CM

## 2021-08-06 DIAGNOSIS — M1990 Unspecified osteoarthritis, unspecified site: Secondary | ICD-10-CM

## 2021-08-06 DIAGNOSIS — E7800 Pure hypercholesterolemia, unspecified: Secondary | ICD-10-CM

## 2021-08-06 DIAGNOSIS — R0600 Dyspnea, unspecified: Secondary | ICD-10-CM

## 2021-08-06 DIAGNOSIS — K859 Acute pancreatitis without necrosis or infection, unspecified: Secondary | ICD-10-CM

## 2021-08-06 DIAGNOSIS — G43909 Migraine, unspecified, not intractable, without status migrainosus: Secondary | ICD-10-CM

## 2021-08-06 DIAGNOSIS — M549 Dorsalgia, unspecified: Secondary | ICD-10-CM

## 2021-08-06 DIAGNOSIS — G5601 Carpal tunnel syndrome, right upper limb: Secondary | ICD-10-CM

## 2021-08-06 DIAGNOSIS — G4733 Obstructive sleep apnea (adult) (pediatric): Secondary | ICD-10-CM

## 2021-08-06 DIAGNOSIS — R519 Generalized headaches: Secondary | ICD-10-CM

## 2021-08-06 DIAGNOSIS — F99 Mental disorder, not otherwise specified: Secondary | ICD-10-CM

## 2021-08-06 DIAGNOSIS — T753XXA Motion sickness, initial encounter: Secondary | ICD-10-CM

## 2021-08-06 DIAGNOSIS — Z720 Tobacco use: Secondary | ICD-10-CM

## 2021-08-06 DIAGNOSIS — I10 Essential (primary) hypertension: Secondary | ICD-10-CM

## 2021-08-07 ENCOUNTER — Encounter: Admit: 2021-08-07 | Discharge: 2021-08-07 | Payer: PRIVATE HEALTH INSURANCE

## 2021-08-07 DIAGNOSIS — Z9189 Other specified personal risk factors, not elsewhere classified: Secondary | ICD-10-CM

## 2021-08-07 DIAGNOSIS — R301 Vesical tenesmus: Secondary | ICD-10-CM

## 2021-08-07 MED ORDER — CIPROFLOXACIN HCL 250 MG PO TAB
250 mg | ORAL_TABLET | Freq: Two times a day (BID) | ORAL | 0 refills | 10.00000 days | Status: AC
Start: 2021-08-07 — End: ?

## 2021-08-07 NOTE — Telephone Encounter
Recived mychart message from patient regarding foley cath and urine. Urine appeared blood tinged, noting that yesterday a different RN spoke to her and it was more pinkish. Pt verbalized as of last night, urine had turned darker red. Pt reports pain/discomfort in her pelvis. No trauma while placing foley and patient report she has not pulled/triped/tugged at cath. She reports no blood clots in bag or tube. No fevers or chills. She does have bladder spasm history, she states she can feel her spasms consistently today.   Reviewed with Dr. Joaquim Lai, he would like her to get a UA, today if possible. We will start her on Bactrim DS for 5 days. and instead of removing cath on Thursday, he would like it removed on Friday pending results of UA.     Pt will go to PCP office for UA. Called to discuss with patient, she is going today. We will also send in antibiotics to [facility].

## 2021-08-09 ENCOUNTER — Encounter: Admit: 2021-08-09 | Discharge: 2021-08-09 | Payer: PRIVATE HEALTH INSURANCE

## 2021-08-09 ENCOUNTER — Ambulatory Visit: Admit: 2021-08-09 | Discharge: 2021-08-09 | Payer: Commercial Managed Care - HMO

## 2021-08-09 DIAGNOSIS — R42 Dizziness and giddiness: Secondary | ICD-10-CM

## 2021-08-09 DIAGNOSIS — F99 Mental disorder, not otherwise specified: Secondary | ICD-10-CM

## 2021-08-09 DIAGNOSIS — I10 Essential (primary) hypertension: Secondary | ICD-10-CM

## 2021-08-09 DIAGNOSIS — R0600 Dyspnea, unspecified: Secondary | ICD-10-CM

## 2021-08-09 DIAGNOSIS — G44039 Episodic paroxysmal hemicrania, not intractable: Secondary | ICD-10-CM

## 2021-08-09 DIAGNOSIS — Z720 Tobacco use: Secondary | ICD-10-CM

## 2021-08-09 DIAGNOSIS — G4733 Obstructive sleep apnea (adult) (pediatric): Secondary | ICD-10-CM

## 2021-08-09 DIAGNOSIS — E7800 Pure hypercholesterolemia, unspecified: Secondary | ICD-10-CM

## 2021-08-09 DIAGNOSIS — R519 Generalized headaches: Secondary | ICD-10-CM

## 2021-08-09 DIAGNOSIS — N3010 Interstitial cystitis (chronic) without hematuria: Secondary | ICD-10-CM

## 2021-08-09 DIAGNOSIS — J302 Other seasonal allergic rhinitis: Secondary | ICD-10-CM

## 2021-08-09 DIAGNOSIS — G43909 Migraine, unspecified, not intractable, without status migrainosus: Secondary | ICD-10-CM

## 2021-08-09 DIAGNOSIS — T753XXA Motion sickness, initial encounter: Secondary | ICD-10-CM

## 2021-08-09 DIAGNOSIS — M549 Dorsalgia, unspecified: Secondary | ICD-10-CM

## 2021-08-09 DIAGNOSIS — G5601 Carpal tunnel syndrome, right upper limb: Secondary | ICD-10-CM

## 2021-08-09 DIAGNOSIS — J449 Chronic obstructive pulmonary disease, unspecified: Secondary | ICD-10-CM

## 2021-08-09 DIAGNOSIS — K859 Acute pancreatitis without necrosis or infection, unspecified: Secondary | ICD-10-CM

## 2021-08-09 DIAGNOSIS — M1990 Unspecified osteoarthritis, unspecified site: Secondary | ICD-10-CM

## 2021-08-09 NOTE — Telephone Encounter
Pt to have foley cath pulled in clinic today. Verified with patient that is still the plan.

## 2021-08-09 NOTE — Progress Notes
Christina Garrison presented to clinic to have her foley catheter removed. She has concerns that she will be unable to void after removal. We discussed her going to the ED after 6 hours or if she became uncomfortable to have them assess how much she is retaining. We talked about increasing water intake and using a peri bottle with warm water when trying to void. We also discussed finishing up the antibiotic Dr. Joaquim Lai prescribed.  She nods in understanding. She has no further questions.     Urine was clear and blood tinged. 268ml was in the catheter bag at removal.       Angela Nevin, RN

## 2021-08-10 ENCOUNTER — Encounter: Admit: 2021-08-10 | Discharge: 2021-08-10 | Payer: PRIVATE HEALTH INSURANCE

## 2021-08-10 MED ORDER — SERTRALINE 25 MG PO TAB
25 mg | ORAL_TABLET | Freq: Every day | ORAL | 2 refills
Start: 2021-08-10 — End: ?

## 2021-08-16 ENCOUNTER — Encounter: Admit: 2021-08-16 | Discharge: 2021-08-16 | Payer: PRIVATE HEALTH INSURANCE

## 2021-08-16 DIAGNOSIS — Z136 Encounter for screening for cardiovascular disorders: Secondary | ICD-10-CM

## 2021-08-16 DIAGNOSIS — R079 Chest pain, unspecified: Secondary | ICD-10-CM

## 2021-08-16 DIAGNOSIS — I288 Other diseases of pulmonary vessels: Secondary | ICD-10-CM

## 2021-08-16 DIAGNOSIS — E785 Hyperlipidemia, unspecified: Secondary | ICD-10-CM

## 2021-08-16 DIAGNOSIS — I10 Essential (primary) hypertension: Secondary | ICD-10-CM

## 2021-08-16 LAB — LIPID PROFILE
CHOLESTEROL/HDL %: 4
CHOLESTEROL: 182
HDL: 44
LDL: 115 — ABNORMAL HIGH (ref <100)
TRIGLYCERIDES: 118
VLDL: 24 — ABNORMAL LOW (ref 8–16)

## 2021-08-16 LAB — BASIC METABOLIC PANEL
BLD UREA NITROGEN: 9 — ABNORMAL LOW (ref 9.8–20.1)
CHLORIDE: 108 — ABNORMAL HIGH (ref 98–107)
CO2: 26
CREATININE: 0.6
POTASSIUM: 4.4
SODIUM: 139

## 2021-08-16 LAB — ALT (SGPT): ALT: 16

## 2021-08-19 ENCOUNTER — Encounter: Admit: 2021-08-19 | Discharge: 2021-08-19 | Payer: PRIVATE HEALTH INSURANCE

## 2021-08-19 MED ORDER — EZETIMIBE 10 MG PO TAB
10 mg | ORAL_TABLET | Freq: Every day | ORAL | 3 refills | Status: AC
Start: 2021-08-19 — End: ?

## 2021-08-19 NOTE — Telephone Encounter
-----   Message from Nehemiah Massed, MD sent at 08/16/2021  4:26 PM CST -----  To all: Sure.  Have her stay on rosuvastatin 20 mg daily and start Zetia 10 mg daily.  Please recheck fasting lipid profile and ALT in 3 months.  Thanks.  SBG  ----- Message -----  From: Asencion Noble  Sent: 08/16/2021   2:55 PM CST  To: Nehemiah Massed, MD    Taking medications as directed do we need to increase crestor or add zetia or both?  ----- Message -----  From: Nehemiah Massed, MD  Sent: 08/16/2021   2:33 PM CST  To: Claretta Fraise, RN, Asencion Noble    Her LDL cholesterol looks higher than usual.  Please verify her dose of statin.  I believe she was tolerating 20 mg of rosuvastatin daily.  Her ALT looks fine.  Thanks.  SBG  ----- Message -----  From: Asencion Noble  Sent: 08/16/2021  11:45 AM CST  To: Nehemiah Massed, MD    Labs for your review and recommendations

## 2021-08-20 ENCOUNTER — Ambulatory Visit: Admit: 2021-08-20 | Discharge: 2021-08-20 | Payer: Commercial Managed Care - HMO

## 2021-08-20 ENCOUNTER — Encounter: Admit: 2021-08-20 | Discharge: 2021-08-20 | Payer: PRIVATE HEALTH INSURANCE

## 2021-08-20 DIAGNOSIS — K859 Acute pancreatitis without necrosis or infection, unspecified: Secondary | ICD-10-CM

## 2021-08-20 DIAGNOSIS — E7800 Pure hypercholesterolemia, unspecified: Secondary | ICD-10-CM

## 2021-08-20 DIAGNOSIS — G4733 Obstructive sleep apnea (adult) (pediatric): Secondary | ICD-10-CM

## 2021-08-20 DIAGNOSIS — F99 Mental disorder, not otherwise specified: Secondary | ICD-10-CM

## 2021-08-20 DIAGNOSIS — J449 Chronic obstructive pulmonary disease, unspecified: Secondary | ICD-10-CM

## 2021-08-20 DIAGNOSIS — G5601 Carpal tunnel syndrome, right upper limb: Secondary | ICD-10-CM

## 2021-08-20 DIAGNOSIS — T753XXA Motion sickness, initial encounter: Secondary | ICD-10-CM

## 2021-08-20 DIAGNOSIS — N3010 Interstitial cystitis (chronic) without hematuria: Secondary | ICD-10-CM

## 2021-08-20 DIAGNOSIS — J302 Other seasonal allergic rhinitis: Secondary | ICD-10-CM

## 2021-08-20 DIAGNOSIS — M1990 Unspecified osteoarthritis, unspecified site: Secondary | ICD-10-CM

## 2021-08-20 DIAGNOSIS — G44039 Episodic paroxysmal hemicrania, not intractable: Secondary | ICD-10-CM

## 2021-08-20 DIAGNOSIS — G43909 Migraine, unspecified, not intractable, without status migrainosus: Secondary | ICD-10-CM

## 2021-08-20 DIAGNOSIS — R42 Dizziness and giddiness: Secondary | ICD-10-CM

## 2021-08-20 DIAGNOSIS — I10 Essential (primary) hypertension: Secondary | ICD-10-CM

## 2021-08-20 DIAGNOSIS — R519 Generalized headaches: Secondary | ICD-10-CM

## 2021-08-20 DIAGNOSIS — Z720 Tobacco use: Secondary | ICD-10-CM

## 2021-08-20 DIAGNOSIS — Z4889 Encounter for other specified surgical aftercare: Secondary | ICD-10-CM

## 2021-08-20 DIAGNOSIS — R0600 Dyspnea, unspecified: Secondary | ICD-10-CM

## 2021-08-20 DIAGNOSIS — M549 Dorsalgia, unspecified: Secondary | ICD-10-CM

## 2021-09-03 ENCOUNTER — Encounter: Admit: 2021-09-03 | Discharge: 2021-09-03 | Payer: PRIVATE HEALTH INSURANCE

## 2021-09-03 MED ORDER — AMITRIPTYLINE 25 MG PO TAB
ORAL_TABLET | Freq: Every evening | ORAL | 4 refills | Status: AC
Start: 2021-09-03 — End: ?

## 2021-09-03 NOTE — Telephone Encounter
I received a message from Christina Garrison's psychiatrist that from their perspective, there is no contraindication to starting amitriptyline for migraine prevention.  I discussed this with Christina Garrison, who agreed that she would like to start this medication to try to achieve better migraine control.  Plan:  Start amitriptyline 25 mg for 1 week, then 50 mg for 1 week, then 75 mg daily

## 2021-09-26 ENCOUNTER — Encounter: Admit: 2021-09-26 | Discharge: 2021-09-26 | Payer: PRIVATE HEALTH INSURANCE

## 2021-09-26 DIAGNOSIS — F418 Other specified anxiety disorders: Secondary | ICD-10-CM

## 2021-09-26 DIAGNOSIS — F341 Dysthymic disorder: Secondary | ICD-10-CM

## 2021-09-26 DIAGNOSIS — F4310 Post-traumatic stress disorder, unspecified: Secondary | ICD-10-CM

## 2021-09-26 MED ORDER — DIVALPROEX 250 MG PO TBEC
750 mg | ORAL_TABLET | Freq: Every day | ORAL | 1 refills | 30.00000 days | Status: AC
Start: 2021-09-26 — End: ?

## 2021-10-02 ENCOUNTER — Ambulatory Visit: Admit: 2021-10-02 | Discharge: 2021-10-02 | Payer: BC Managed Care – PPO

## 2021-10-02 ENCOUNTER — Encounter: Admit: 2021-10-02 | Discharge: 2021-10-02 | Payer: BC Managed Care – PPO

## 2021-10-02 DIAGNOSIS — E7800 Pure hypercholesterolemia, unspecified: Secondary | ICD-10-CM

## 2021-10-02 DIAGNOSIS — R42 Dizziness and giddiness: Secondary | ICD-10-CM

## 2021-10-02 DIAGNOSIS — F99 Mental disorder, not otherwise specified: Secondary | ICD-10-CM

## 2021-10-02 DIAGNOSIS — T753XXA Motion sickness, initial encounter: Secondary | ICD-10-CM

## 2021-10-02 DIAGNOSIS — R0600 Dyspnea, unspecified: Secondary | ICD-10-CM

## 2021-10-02 DIAGNOSIS — G44039 Episodic paroxysmal hemicrania, not intractable: Secondary | ICD-10-CM

## 2021-10-02 DIAGNOSIS — R519 Generalized headaches: Secondary | ICD-10-CM

## 2021-10-02 DIAGNOSIS — I10 Essential (primary) hypertension: Secondary | ICD-10-CM

## 2021-10-02 DIAGNOSIS — N3010 Interstitial cystitis (chronic) without hematuria: Secondary | ICD-10-CM

## 2021-10-02 DIAGNOSIS — G5601 Carpal tunnel syndrome, right upper limb: Secondary | ICD-10-CM

## 2021-10-02 DIAGNOSIS — Z720 Tobacco use: Secondary | ICD-10-CM

## 2021-10-02 DIAGNOSIS — M1990 Unspecified osteoarthritis, unspecified site: Secondary | ICD-10-CM

## 2021-10-02 DIAGNOSIS — K859 Acute pancreatitis without necrosis or infection, unspecified: Secondary | ICD-10-CM

## 2021-10-02 DIAGNOSIS — M779 Enthesopathy, unspecified: Secondary | ICD-10-CM

## 2021-10-02 DIAGNOSIS — G4733 Obstructive sleep apnea (adult) (pediatric): Secondary | ICD-10-CM

## 2021-10-02 DIAGNOSIS — J449 Chronic obstructive pulmonary disease, unspecified: Secondary | ICD-10-CM

## 2021-10-02 DIAGNOSIS — M549 Dorsalgia, unspecified: Secondary | ICD-10-CM

## 2021-10-02 DIAGNOSIS — G43909 Migraine, unspecified, not intractable, without status migrainosus: Secondary | ICD-10-CM

## 2021-10-02 DIAGNOSIS — J302 Other seasonal allergic rhinitis: Secondary | ICD-10-CM

## 2021-10-02 NOTE — Progress Notes
Date of Service: 10/02/2021    Subjective:             Christina Garrison is a 51 y.o. female.    History of Present Illness  Continues to struggle with a couple of issues.  Cannot get small/ring/middle fingers into full extension.  Also having a fair bit of decreased sensation in small and ring fingers and pain radiating down from the elbow.     Review of Systems   Constitutional: Negative.    HENT: Negative.    Eyes: Negative.    Respiratory: Negative.    Cardiovascular: Negative.    Gastrointestinal: Negative.    Endocrine: Negative.    Genitourinary: Negative.    Musculoskeletal: Negative.    Skin: Negative.    Allergic/Immunologic: Negative.    Neurological: Negative.    Hematological: Negative.    Psychiatric/Behavioral: Negative.          Objective:         ? amitriptyline (ELAVIL) 25 mg tablet Take one tablet by mouth at bedtime daily for 7 days, THEN two tablets at bedtime daily for 7 days, THEN three tablets at bedtime daily for 90 days.   ? biotin 5,000 mcg TbDi Dissolve 2 tablets by mouth twice daily.   ? buPROPion HCL (WELLBUTRIN) 100 mg tablet Take one tablet by mouth daily.   ? cholecalciferol (VITAMIN D-3) 1,000 units tablet Take 1,000 Units by mouth twice weekly.   ? clonazePAM (KLONOPIN) 0.5 mg tablet Take 1.5 tablets by mouth twice daily.   ? clonazePAM (KLONOPIN) 1 mg tablet Take one tablet by mouth as Needed (For driving).   ? cyanocobalamin (vitamin B-12) 2,500 mcg tab Take 1 tablet by mouth daily with breakfast.   ? diphenhydrAMINE HCL (BENADRYL) 12.5 mg/5 mL oral solution Take 12.5 mg by mouth every 6 hours as needed.   ? divalproex (DEPAKOTE EC) 250 mg DR tablet TAKE THREE TABLETS BY MOUTH DAILY. TAKE WITH FOOD.   ? ezetimibe (ZETIA) 10 mg tablet Take one tablet by mouth daily.   ? famotidine (PEPCID) 20 mg tablet Take 20 mg by mouth daily as needed.   ? magnesium oxide (MAGOX) 400 mg (241.3 mg magnesium) tablet Take one tablet by mouth daily.   ? multivit, Ca, min-FA-soy isofl 400-60 mcg-mg tab Take 1 tablet by mouth twice daily.   ? mupirocin (CENTANY) 2 % topical ointment Apply  topically to affected area twice daily.   ? nebivoloL (BYSTOLIC) 5 mg tablet Take one tablet by mouth daily.   ? ondansetron (ZOFRAN ODT) 4 mg rapid dissolve tablet Dissolve two tablets by mouth every 8 hours as needed for Nausea or Vomiting. Place on tongue to dissolve.   ? ondansetron (ZOFRAN ODT) 4 mg rapid dissolve tablet Dissolve one tablet by mouth every 8 hours as needed. Place on tongue to disolve.   ? pantoprazole DR (PROTONIX) 40 mg tablet TAKE 1 TABLET BY MOUTH EVERY DAY (Patient taking differently: Take 40 mg by mouth as Needed.)   ? Potassium 99 mg tab Take 1 tablet by mouth three times weekly.   ? prochlorperazine maleate (COMPAZINE) 10 mg tablet Take one tablet by mouth every 6 hours as needed for Nausea or Vomiting.   ? riboflavin (vitamin B2) 400 mg tab Take one tablet by mouth daily.   ? rosuvastatin (CRESTOR) 20 mg tablet Take one tablet by mouth daily.   ? sertraline (ZOLOFT) 25 mg tablet TAKE ONE TABLET BY MOUTH DAILY. PLEASE TAKE WITH 50MG  TABLETS  FOR TOTAL DAILY DOSE OF 75MG    ? sertraline (ZOLOFT) 50 mg tablet Take one tablet by mouth daily. Please take with 25mg  tablet for total daily dose of 75mg    ? sumatriptan succinate (IMITREX) 100 mg tablet Take one tablet by mouth at onset of headache. May repeat after 2 hours if needed. Max of 200 mg in 24 hours.  Limit use to less than 10 tablets per month.   ? verapamil (CALAN) 40 mg tablet Take 1/2 tablet by mouth in the morning and 1 tablet in the evening   ? vitamins, multi w/minerals 9 mg iron-400 mcg tab Take 1 tablet by mouth daily with breakfast.     Vitals:    10/02/21 1509   BP: 139/75   Pulse: 72   Weight: 107.5 kg (237 lb)   Height: 165.1 cm (5' 5)     Body mass index is 39.44 kg/m?Marland Kitchen     Physical Exam  Right small/ring/middle fingers with significant tendon adhesions and flexion contracture  Physical Exam   Constitutional: Patient is oriented to person, place, and time. Patient appears well-developed and well-nourished.   HENT:   Head: Normocephalic and atraumatic.   Eyes: EOM are normal. Pupils are equal, round, and reactive to light.   Neck: Normal range of motion.   Cardiovascular: Normal rate, regular rhythm, normal heart sounds and intact distal pulses.    Pulmonary/Chest: Effort normal and breath sounds normal.   Neurological: Patient is alert and oriented to person, place, and time.   Skin: Skin is warm and dry. Capillary refill takes less than 2 seconds.   Psychiatric: Patient has a normal mood and affect. Behavior is normal. Judgment and thought content normal.          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  Discussed tenolysis to release scar tissue and get her moving in therapy the day after surgery.  She would like to do that at [facility] if she has had better experiences with the therapy team at that location.  Risks of bleeding, infection, recurrent scarring, nerve injury, need for further procedures were described.  Her questions were answered.  We will schedule at her convenience.      Flexor tenolysis, right small, ring, middle fingers  Regional anesthesia  Outpatient  60 minutes  2-week follow-up  Therapy to start the day after surgery, AROM, P ROM, edema control  Surgery to be done on a Thursday  [id number]

## 2021-10-03 ENCOUNTER — Ambulatory Visit: Admit: 2021-10-03 | Discharge: 2021-10-03 | Payer: BC Managed Care – PPO

## 2021-10-03 ENCOUNTER — Encounter: Admit: 2021-10-03 | Discharge: 2021-10-03 | Payer: BC Managed Care – PPO

## 2021-10-03 DIAGNOSIS — J302 Other seasonal allergic rhinitis: Secondary | ICD-10-CM

## 2021-10-03 DIAGNOSIS — F99 Mental disorder, not otherwise specified: Secondary | ICD-10-CM

## 2021-10-03 DIAGNOSIS — M549 Dorsalgia, unspecified: Secondary | ICD-10-CM

## 2021-10-03 DIAGNOSIS — G5601 Carpal tunnel syndrome, right upper limb: Secondary | ICD-10-CM

## 2021-10-03 DIAGNOSIS — R42 Dizziness and giddiness: Secondary | ICD-10-CM

## 2021-10-03 DIAGNOSIS — G43909 Migraine, unspecified, not intractable, without status migrainosus: Secondary | ICD-10-CM

## 2021-10-03 DIAGNOSIS — G4733 Obstructive sleep apnea (adult) (pediatric): Secondary | ICD-10-CM

## 2021-10-03 DIAGNOSIS — M1990 Unspecified osteoarthritis, unspecified site: Secondary | ICD-10-CM

## 2021-10-03 DIAGNOSIS — M65341 Trigger finger, right ring finger: Secondary | ICD-10-CM

## 2021-10-03 DIAGNOSIS — I10 Essential (primary) hypertension: Secondary | ICD-10-CM

## 2021-10-03 DIAGNOSIS — J449 Chronic obstructive pulmonary disease, unspecified: Secondary | ICD-10-CM

## 2021-10-03 DIAGNOSIS — M65351 Trigger finger, right little finger: Secondary | ICD-10-CM

## 2021-10-03 DIAGNOSIS — E7800 Pure hypercholesterolemia, unspecified: Secondary | ICD-10-CM

## 2021-10-03 DIAGNOSIS — N3010 Interstitial cystitis (chronic) without hematuria: Secondary | ICD-10-CM

## 2021-10-03 DIAGNOSIS — M779 Enthesopathy, unspecified: Secondary | ICD-10-CM

## 2021-10-03 DIAGNOSIS — R519 Generalized headaches: Secondary | ICD-10-CM

## 2021-10-03 DIAGNOSIS — G44039 Episodic paroxysmal hemicrania, not intractable: Secondary | ICD-10-CM

## 2021-10-03 DIAGNOSIS — M65331 Trigger finger, right middle finger: Secondary | ICD-10-CM

## 2021-10-03 DIAGNOSIS — R0600 Dyspnea, unspecified: Secondary | ICD-10-CM

## 2021-10-03 DIAGNOSIS — K859 Acute pancreatitis without necrosis or infection, unspecified: Secondary | ICD-10-CM

## 2021-10-03 DIAGNOSIS — Z720 Tobacco use: Secondary | ICD-10-CM

## 2021-10-03 DIAGNOSIS — T753XXA Motion sickness, initial encounter: Secondary | ICD-10-CM

## 2021-10-03 NOTE — Progress Notes
[facility] phone triage completed with patient for surgery on 3/2 with Dr. Felecia Shelling.  Medications, allergies and medical history reviewed and updated in chart. Hx of OSA,COPD, HTN, GERD, Bi-Polar, migraines, last sx 08/05/2021, ASA3, denies any other cardiopulmonary issues, denies CP, SOA with climbing 2 flights of stairs,denies Anesthesia complications, denies recent URI symptoms, labs 08/16/21,  Pre-op instructions reviewed with pt; NPO after 11pm the night before surgery.Water ok to drink up to 2 hours prior to check in time at hospital. Shower with anti-bacterial soap. Dress in comfortable clothing. No jewelry or piercings, no lotions or creams. Pt understands to have driver home following surgery. Review visitor policy.  Stop vitamins and supplements two weeks prior to surgery and NSAIDs one week prior to surgery.  Patient verbalizes understanding, Copy sent to MyChart

## 2021-10-03 NOTE — Pre-Anesthesia Patient Instructions
GENERAL INFORMATION    Before you come to the hospital  If you are having an outpatient procedure, you will need to arrange for a responsible ride/person to accompany you home due to sedation or anesthesia with your procedure. A responsible person is a person who has the ability to identify a change in the patient's status and notify medical personnel.  This is typically a family member or friend.  Public transportation is permitted if you have a responsible person to accompany you.  An [employer] Goad, taxi or other public transportation driver is not considered a responsible person to accompany you home.  Bath/Shower Instructions  Take a bath or shower with antibacterial soap the night before or the morning of your procedure. Use clean towels.  Put on clean clothes after bath or shower.  Avoid using lotion and oils.  If you are having surgery above the waist, wear a shirt that fastens up the front.  Sleep on clean sheets if bath or shower is done the night before procedure.  Leave money, credit cards, jewelry, and any other valuables at home. [facility] is not responsible for the loss or breakage of personal items.  Remove nail polish, makeup and all jewelry (including piercings) before coming to the hospital.  The morning of your procedure:  brush your teeth and tongue  do not smoke, vape, chew or use any tobacco products  do not shave the area where you will have surgery    What to bring to the hospital  ID/ Insurance Card  Medical Device card  Official documents for legal guardianship   Copy of your Living Will, Advanced Directives, and/or Durable Power of Attorney.  If you have these documents, please bring them to the admissions office on the day of your surgery to be scanned into your records.  Small bag with a few personal belongings  Cases for glasses/hearing aids/contact lens (bring solutions for contacts)  Dress in clean, loose, comfortable clothing     Preparing to get your medications at discharge  Your surgeon may prescribe you medications to take after your procedure.  If you would like the convenience of having your medications filled here at [place] please do one of the following:  Go to [place] pharmacy after your [facility] appointment to put a credit card on file.  Call [place] pharmacy at [phone] (Monday-Friday 7am-9pm or Saturday and Sunday 9am-5pm) to put a credit card on file.  Bring a credit card or cash on the day of your procedure- please leave with a family member rather than bringing it into the preop area.     Eating or drinking before surgery  Do not eat or drink anything after 11:00 p.m. the day before your procedure (including gum, mints, candy, or chewing tobacco) OR follow the specific instructions you were given by your Surgeon.  You may have WATER ONLY up to 2 hours before arriving at the hospital.     Other instructions  Notify your surgeon if:  there is a possibility that you are pregnant   you become ill with a cough, fever, sore throat, nausea, vomiting or flu-like symptoms  you have any open wounds/sores that are red, painful, draining, or are new since you last saw the doctor  you need to cancel your procedure    On the day of your procedure, notify us at [facility]: [phone]  if you need to cancel your procedure  if you are going to be late  Arrival at the hospital  [facility]  [address]  [place], [place] [address]    Park in the [employer], located directly across from the main entrance to the hospital.  Enter through the ground floor main hospital entrance and check in at the Information Desk in the lobby.  They will validate your parking ticket and direct you to the next location.  If you are a woman between the ages of 65 and 5, and have not had a hysterectomy, you will be asked for a urine sample prior to surgery.  Please do not urinate before arriving in the Surgery Waiting Room.  Once there, check in and let the attendant know if you need to provide a sample.    You will receive a call with your surgery arrival time between 2:30pm and 4:30pm the last business day before your procedure.  If you do not receive a call, please call [phone] before 4:30pm or [phone] after 4:30pm.          For the safety of all patients, visitors and staff as we work to contain COVID-19, we must restrict patient visitors.    Current Visitor Policy (04/01/21):    Our current, and ongoing, visitor rules in surgery and procedural areas are:    2 visitors per patient will be allowed to accompany the patient and wait in the Waiting Room.     Patients in inpatient and pediatric units, Emergency Department, ambulatory clinics and lab appointments may only have two visitors.       For inpatient stays, patients may have 2 visitors at a time at their bedside. The two visitors can change throughout the day, but no more than two at a time may be bedside.  The policy applies to [facility] [place], [address], [place] and [place] campuses and clinics.    Exceptions include:  No visitors allowed for patients with active COVID-19 infections.  Children younger than age 79 are allowed to visit inpatients.  Two parents/guardians are allowed for surgical or procedural patients younger than 53 years old.  Adult inpatients in semiprivate rooms may have visitors, but visits should be coordinated so only two total visitors are in a room at a time due to space limitations.    Visitors must be free of fever and symptoms to be in our facilities. We ask visitors to follow these guidelines:  Wear a mask at all times, unless under the age of 2, have trouble breathing or are unconscious, incapacitated or otherwise unable to remove the cover without assistance.  Go directly to the nursing station in the unit you are visiting and do not linger in public areas.  Check in at the nursing station before going to the patient's room.  Maintain a physical distance of six feet from all others.  Follow elevator restrictions to four riding at a time - peak times are 6:30-7:30 a.m., noon and 6:30-7:30 p.m.  Be aware cafeteria peak times are 11 a.m. - 1 p.m.  Wash your hands frequently and cover your coughs and sneezes.

## 2021-10-03 NOTE — Progress Notes
Patient scheduled for surgery on 10/31/2021.    Dr. Felecia Shelling wanting patient in outpatient therapy the day after surgery to maximize recovery     Order placed per transcription on note dated 10/02/2021 and faxed to [facility] outpatient rehab at [phone]

## 2021-10-04 ENCOUNTER — Encounter: Admit: 2021-10-04 | Discharge: 2021-10-04 | Payer: BC Managed Care – PPO

## 2021-10-04 MED ORDER — AMITRIPTYLINE 75 MG PO TAB
75 mg | ORAL_TABLET | Freq: Every evening | ORAL | 3 refills | Status: AC
Start: 2021-10-04 — End: ?

## 2021-10-11 ENCOUNTER — Encounter: Admit: 2021-10-11 | Discharge: 2021-10-11 | Payer: BC Managed Care – PPO

## 2021-10-11 MED ORDER — CLONAZEPAM 1 MG PO TAB
ORAL_TABLET | 0 refills | PRN
Start: 2021-10-11 — End: ?

## 2021-10-11 MED ORDER — CLONAZEPAM 0.5 MG PO TAB
ORAL_TABLET | Freq: Two times a day (BID) | 0 refills
Start: 2021-10-11 — End: ?

## 2021-10-21 ENCOUNTER — Encounter: Admit: 2021-10-21 | Discharge: 2021-10-21 | Payer: BC Managed Care – PPO

## 2021-10-30 ENCOUNTER — Encounter: Admit: 2021-10-30 | Discharge: 2021-10-30 | Payer: BC Managed Care – PPO

## 2021-10-31 ENCOUNTER — Ambulatory Visit: Admit: 2021-10-31 | Discharge: 2021-10-31 | Payer: BC Managed Care – PPO

## 2021-10-31 ENCOUNTER — Encounter: Admit: 2021-10-31 | Discharge: 2021-10-31 | Payer: BC Managed Care – PPO

## 2021-10-31 DIAGNOSIS — R42 Dizziness and giddiness: Secondary | ICD-10-CM

## 2021-10-31 DIAGNOSIS — G44039 Episodic paroxysmal hemicrania, not intractable: Secondary | ICD-10-CM

## 2021-10-31 DIAGNOSIS — K859 Acute pancreatitis without necrosis or infection, unspecified: Secondary | ICD-10-CM

## 2021-10-31 DIAGNOSIS — G4733 Obstructive sleep apnea (adult) (pediatric): Secondary | ICD-10-CM

## 2021-10-31 DIAGNOSIS — E7800 Pure hypercholesterolemia, unspecified: Secondary | ICD-10-CM

## 2021-10-31 DIAGNOSIS — I10 Essential (primary) hypertension: Secondary | ICD-10-CM

## 2021-10-31 DIAGNOSIS — T753XXA Motion sickness, initial encounter: Secondary | ICD-10-CM

## 2021-10-31 DIAGNOSIS — F99 Mental disorder, not otherwise specified: Secondary | ICD-10-CM

## 2021-10-31 DIAGNOSIS — G5601 Carpal tunnel syndrome, right upper limb: Secondary | ICD-10-CM

## 2021-10-31 DIAGNOSIS — J449 Chronic obstructive pulmonary disease, unspecified: Secondary | ICD-10-CM

## 2021-10-31 DIAGNOSIS — N3010 Interstitial cystitis (chronic) without hematuria: Secondary | ICD-10-CM

## 2021-10-31 DIAGNOSIS — Z720 Tobacco use: Secondary | ICD-10-CM

## 2021-10-31 DIAGNOSIS — G43909 Migraine, unspecified, not intractable, without status migrainosus: Secondary | ICD-10-CM

## 2021-10-31 DIAGNOSIS — M549 Dorsalgia, unspecified: Secondary | ICD-10-CM

## 2021-10-31 DIAGNOSIS — R0600 Dyspnea, unspecified: Secondary | ICD-10-CM

## 2021-10-31 DIAGNOSIS — R519 Generalized headaches: Secondary | ICD-10-CM

## 2021-10-31 DIAGNOSIS — M1990 Unspecified osteoarthritis, unspecified site: Secondary | ICD-10-CM

## 2021-10-31 DIAGNOSIS — J302 Other seasonal allergic rhinitis: Secondary | ICD-10-CM

## 2021-10-31 MED ORDER — CEFAZOLIN 1 GRAM IJ SOLR
INTRAVENOUS | 0 refills | Status: DC
Start: 2021-10-31 — End: 2021-10-31
  Administered 2021-10-31: 16:00:00 2 g via INTRAVENOUS

## 2021-10-31 MED ORDER — PROPOFOL 10 MG/ML IV EMUL 50 ML (INFUSION)(AM)(OR)
INTRAVENOUS | 0 refills | Status: DC
Start: 2021-10-31 — End: 2021-10-31
  Administered 2021-10-31: 16:00:00 120 ug/kg/min via INTRAVENOUS

## 2021-10-31 MED ORDER — LIDOCAINE (PF) 10 MG/ML (1 %) IJ SOLN
SUBCUTANEOUS | 0 refills | Status: CP
Start: 2021-10-31 — End: ?
  Administered 2021-10-31: 16:00:00 5 mL via SUBCUTANEOUS

## 2021-10-31 MED ORDER — FENTANYL CITRATE (PF) 50 MCG/ML IJ SOLN
INTRAVENOUS | 0 refills | Status: DC
Start: 2021-10-31 — End: 2021-10-31
  Administered 2021-10-31 (×2): 50 ug via INTRAVENOUS

## 2021-10-31 MED ORDER — LIDOCAINE (PF) 20 MG/ML (2 %) IJ SOLN
0 refills | Status: CP
Start: 2021-10-31 — End: ?
  Administered 2021-10-31: 16:00:00 10 mL

## 2021-10-31 MED ORDER — BUPIVACAINE HCL 0.5 % (5 MG/ML) IJ SOLN
0 refills | Status: CP
Start: 2021-10-31 — End: ?
  Administered 2021-10-31: 16:00:00 20 mL

## 2021-10-31 MED ORDER — PROPOFOL INJ 10 MG/ML IV VIAL
INTRAVENOUS | 0 refills | Status: DC
Start: 2021-10-31 — End: 2021-10-31
  Administered 2021-10-31: 16:00:00 40 mg via INTRAVENOUS

## 2021-10-31 MED ORDER — MIDAZOLAM 1 MG/ML IJ SOLN
INTRAVENOUS | 0 refills | Status: DC
Start: 2021-10-31 — End: 2021-10-31
  Administered 2021-10-31: 16:00:00 2 mg via INTRAVENOUS

## 2021-10-31 MED ORDER — DEXMEDETOMIDINE IN 0.9 % NACL 20 MCG/5 ML (4 MCG/ML) IV SYRG
INTRAVENOUS | 0 refills | Status: DC
Start: 2021-10-31 — End: 2021-10-31
  Administered 2021-10-31: 16:00:00 4 ug via INTRAVENOUS

## 2021-10-31 MED ADMIN — SODIUM CHLORIDE 0.9 % IV SOLP [27838]: 1000 mL | INTRAVENOUS | @ 16:00:00 | Stop: 2021-10-31 | NDC 00338004904

## 2021-11-01 ENCOUNTER — Encounter: Admit: 2021-11-01 | Discharge: 2021-11-01 | Payer: BC Managed Care – PPO

## 2021-11-01 NOTE — Telephone Encounter
Christina Garrison, OT from [facility] in [place] phoned questioning dressing. Pt with OT at this time. LeAnn questioning if it is ok to remove dressing and if so, what would she need to replace it with?  MD notified. Ok to remove dressing and reapply same dressing or something similar. LeAnn verbalized understanding. Tilda Burrow, RN

## 2021-11-02 ENCOUNTER — Encounter: Admit: 2021-11-02 | Discharge: 2021-11-02 | Payer: BC Managed Care – PPO

## 2021-11-02 DIAGNOSIS — E7800 Pure hypercholesterolemia, unspecified: Secondary | ICD-10-CM

## 2021-11-02 DIAGNOSIS — Z720 Tobacco use: Secondary | ICD-10-CM

## 2021-11-02 DIAGNOSIS — R519 Generalized headaches: Secondary | ICD-10-CM

## 2021-11-02 DIAGNOSIS — T753XXA Motion sickness, initial encounter: Secondary | ICD-10-CM

## 2021-11-02 DIAGNOSIS — M549 Dorsalgia, unspecified: Secondary | ICD-10-CM

## 2021-11-02 DIAGNOSIS — F99 Mental disorder, not otherwise specified: Secondary | ICD-10-CM

## 2021-11-02 DIAGNOSIS — R0600 Dyspnea, unspecified: Secondary | ICD-10-CM

## 2021-11-02 DIAGNOSIS — I10 Essential (primary) hypertension: Secondary | ICD-10-CM

## 2021-11-02 DIAGNOSIS — K859 Acute pancreatitis without necrosis or infection, unspecified: Secondary | ICD-10-CM

## 2021-11-02 DIAGNOSIS — G43909 Migraine, unspecified, not intractable, without status migrainosus: Secondary | ICD-10-CM

## 2021-11-02 DIAGNOSIS — J449 Chronic obstructive pulmonary disease, unspecified: Secondary | ICD-10-CM

## 2021-11-02 DIAGNOSIS — M1990 Unspecified osteoarthritis, unspecified site: Secondary | ICD-10-CM

## 2021-11-02 DIAGNOSIS — G44039 Episodic paroxysmal hemicrania, not intractable: Secondary | ICD-10-CM

## 2021-11-02 DIAGNOSIS — J302 Other seasonal allergic rhinitis: Secondary | ICD-10-CM

## 2021-11-02 DIAGNOSIS — N3010 Interstitial cystitis (chronic) without hematuria: Secondary | ICD-10-CM

## 2021-11-02 DIAGNOSIS — G4733 Obstructive sleep apnea (adult) (pediatric): Secondary | ICD-10-CM

## 2021-11-02 DIAGNOSIS — R42 Dizziness and giddiness: Secondary | ICD-10-CM

## 2021-11-02 DIAGNOSIS — G5601 Carpal tunnel syndrome, right upper limb: Secondary | ICD-10-CM

## 2021-11-04 NOTE — Patient Instructions
-   Preferred method of communication is through [employer], if the issue cannot wait until your next scheduled follow up.   -- MyChart may be used for non-emergent communication. Emails are not reviewed after hours or over the weekend/holidays/after 4PM. Staff will reply to your email within 24-48 business hours.       -- If you do not hear from [place] within one week of a lab or imaging study being completed, please call/send my chart email to the office to be sure that we have received the results. This is especially challenging when tests are done outside of the [facility] system, as many times results do not make it back to our office for a variety of reasons. In our office no news is good news does not apply. You should hear from [place] with results for each test.  [place] lab/imaging results:  Due to the CARES act, results automatically release to MyChart.  Dr. Nilda Simmer will continue to send you a result note on any labs that he orders.  With these changes you may see your results before Dr. Nilda Simmer does.   Please allow up to 72 hours for review and response to your results.     -- If you are having acute (new/sudden onset) or severe/worsening neurologic symptoms, please call 911 or seek care in ED.    -- For scheduling of IMAGING/RADIOLOGY, please call [phone] at your convenience to schedule your studies.  -- For referrals placed during the visit, if you have not heard from scheduling within one week, please call the call center at [phone] to get scheduling assistance.  -- For refills on medications, please first contact your pharmacy, who will fax a refill authorization request form to our office.  Weekdays only. Allow up to 2 business days for refills. Please plan ahead, as refills will not be filled after hours.  -- Our scheduling staff, may be reached at [phone] for scheduling needs.   -- Your nurse Delice Bison, may be contacted at [phone] for urgent needs. Staff will return your call within 24 business hours. For Appointments:   -- Please try to arrive early for your appointment time to help facilitate your visit. 15 minutes early is recommended.   -- If you are late to your appointment, we reserve the right to ask you to reschedule or wait until next available time to be seen in fairness to other patients scheduled that day.   -- There are times when we are running behind in clinic. Our goal is to always be on time, however, there are time when unexpected events occur with patients, which may cause a delay. We appreciate your understanding when this occurs.

## 2021-11-08 ENCOUNTER — Encounter: Admit: 2021-11-08 | Discharge: 2021-11-08 | Payer: BC Managed Care – PPO

## 2021-11-08 ENCOUNTER — Ambulatory Visit: Admit: 2021-11-08 | Discharge: 2021-11-09 | Payer: BC Managed Care – PPO

## 2021-11-08 DIAGNOSIS — G43109 Migraine with aura, not intractable, without status migrainosus: Secondary | ICD-10-CM

## 2021-11-08 DIAGNOSIS — M5481 Occipital neuralgia: Secondary | ICD-10-CM

## 2021-11-08 MED ORDER — SUMATRIPTAN SUCCINATE 100 MG PO TAB
ORAL_TABLET | SUBCUTANEOUS | 5 refills | 30.00000 days | Status: AC
Start: 2021-11-08 — End: ?

## 2021-11-08 MED ORDER — CLONAZEPAM 0.5 MG PO TAB
.75 mg | ORAL_TABLET | Freq: Two times a day (BID) | ORAL | 2 refills | Status: AC
Start: 2021-11-08 — End: ?

## 2021-11-08 MED ORDER — CLONAZEPAM 0.5 MG PO TAB
.75 mg | ORAL_TABLET | Freq: Two times a day (BID) | ORAL | 2 refills | Status: DC
Start: 2021-11-08 — End: 2021-11-08

## 2021-11-08 MED ORDER — MAGNESIUM OXIDE 400 MG (241.3 MG MAGNESIUM) PO TAB
400 mg | ORAL_TABLET | Freq: Every day | ORAL | 3 refills | Status: AC
Start: 2021-11-08 — End: ?

## 2021-11-08 MED ORDER — CLONAZEPAM 1 MG PO TAB
ORAL_TABLET | 2 refills | Status: AC
Start: 2021-11-08 — End: ?

## 2021-11-08 MED ORDER — RIBOFLAVIN (VITAMIN B2) 400 MG PO TAB
400 mg | ORAL_TABLET | Freq: Every day | ORAL | 3 refills | Status: AC
Start: 2021-11-08 — End: ?

## 2021-11-08 MED ORDER — CLONAZEPAM 1 MG PO TAB
ORAL_TABLET | 2 refills | Status: DC
Start: 2021-11-08 — End: 2021-11-08

## 2021-11-09 DIAGNOSIS — R519 Headache, unspecified: Secondary | ICD-10-CM

## 2021-11-09 DIAGNOSIS — G44209 Tension-type headache, unspecified, not intractable: Secondary | ICD-10-CM

## 2021-11-11 ENCOUNTER — Encounter: Admit: 2021-11-11 | Discharge: 2021-11-11 | Payer: BC Managed Care – PPO

## 2021-11-11 DIAGNOSIS — F4310 Post-traumatic stress disorder, unspecified: Secondary | ICD-10-CM

## 2021-11-11 DIAGNOSIS — F418 Other specified anxiety disorders: Secondary | ICD-10-CM

## 2021-11-11 DIAGNOSIS — F341 Dysthymic disorder: Secondary | ICD-10-CM

## 2021-11-11 MED ORDER — SERTRALINE 100 MG PO TAB
ORAL_TABLET | 3 refills
Start: 2021-11-11 — End: ?

## 2021-11-12 ENCOUNTER — Encounter: Admit: 2021-11-12 | Discharge: 2021-11-12 | Payer: BC Managed Care – PPO

## 2021-11-18 ENCOUNTER — Encounter: Admit: 2021-11-18 | Discharge: 2021-11-18 | Payer: BC Managed Care – PPO

## 2021-11-18 DIAGNOSIS — E785 Hyperlipidemia, unspecified: Secondary | ICD-10-CM

## 2021-11-20 ENCOUNTER — Encounter: Admit: 2021-11-20 | Discharge: 2021-11-20 | Payer: BC Managed Care – PPO

## 2021-11-20 ENCOUNTER — Ambulatory Visit: Admit: 2021-11-20 | Discharge: 2021-11-21 | Payer: BC Managed Care – PPO

## 2021-11-20 VITALS — BP 136/72 | HR 79 | Ht 65.0 in | Wt 249.0 lb

## 2021-11-20 DIAGNOSIS — G5601 Carpal tunnel syndrome, right upper limb: Secondary | ICD-10-CM

## 2021-11-20 DIAGNOSIS — G44039 Episodic paroxysmal hemicrania, not intractable: Secondary | ICD-10-CM

## 2021-11-20 DIAGNOSIS — K859 Acute pancreatitis without necrosis or infection, unspecified: Secondary | ICD-10-CM

## 2021-11-20 DIAGNOSIS — J449 Chronic obstructive pulmonary disease, unspecified: Secondary | ICD-10-CM

## 2021-11-20 DIAGNOSIS — M1990 Unspecified osteoarthritis, unspecified site: Secondary | ICD-10-CM

## 2021-11-20 DIAGNOSIS — R0600 Dyspnea, unspecified: Secondary | ICD-10-CM

## 2021-11-20 DIAGNOSIS — R519 Generalized headaches: Secondary | ICD-10-CM

## 2021-11-20 DIAGNOSIS — F99 Mental disorder, not otherwise specified: Secondary | ICD-10-CM

## 2021-11-20 DIAGNOSIS — M549 Dorsalgia, unspecified: Secondary | ICD-10-CM

## 2021-11-20 DIAGNOSIS — R42 Dizziness and giddiness: Secondary | ICD-10-CM

## 2021-11-20 DIAGNOSIS — E7800 Pure hypercholesterolemia, unspecified: Secondary | ICD-10-CM

## 2021-11-20 DIAGNOSIS — T753XXA Motion sickness, initial encounter: Secondary | ICD-10-CM

## 2021-11-20 DIAGNOSIS — G4733 Obstructive sleep apnea (adult) (pediatric): Secondary | ICD-10-CM

## 2021-11-20 DIAGNOSIS — Z720 Tobacco use: Secondary | ICD-10-CM

## 2021-11-20 DIAGNOSIS — G5621 Lesion of ulnar nerve, right upper limb: Principal | ICD-10-CM

## 2021-11-20 DIAGNOSIS — G43909 Migraine, unspecified, not intractable, without status migrainosus: Secondary | ICD-10-CM

## 2021-11-20 DIAGNOSIS — I10 Essential (primary) hypertension: Secondary | ICD-10-CM

## 2021-11-20 DIAGNOSIS — N3010 Interstitial cystitis (chronic) without hematuria: Secondary | ICD-10-CM

## 2021-11-20 DIAGNOSIS — J302 Other seasonal allergic rhinitis: Secondary | ICD-10-CM

## 2021-11-20 NOTE — Progress Notes
Subjective:       Struggling to get ring finger into extension and therapy    Christina Garrison is a 51 y.o. female.       Review of Systems   Constitutional: Negative.    HENT: Negative.    Eyes: Negative.    Respiratory: Negative.    Cardiovascular: Negative.    Gastrointestinal: Negative.    Endocrine: Negative.    Genitourinary: Negative.    Musculoskeletal: Negative.    Skin: Negative.    Allergic/Immunologic: Negative.    Neurological: Negative.    Hematological: Negative.    Psychiatric/Behavioral: Negative.          Objective:         ? amitriptyline (ELAVIL) 75 mg tablet Take one tablet by mouth at bedtime daily.   ? biotin 5,000 mcg TbDi Dissolve 2 tablets by mouth twice daily.   ? buPROPion HCL (WELLBUTRIN) 100 mg tablet Take one tablet by mouth daily.   ? cholecalciferol (VITAMIN D-3) 1,000 units tablet Take one tablet by mouth twice weekly.   ? clonazePAM (KLONOPIN) 0.5 mg tablet Take 1.5 tablets by mouth twice daily for 60 days.   ? clonazePAM (KLONOPIN) 1 mg tablet Take 1 tablet as needed for driving   ? cyanocobalamin (vitamin B-12) 2,500 mcg tab Take one tablet by mouth daily with breakfast.   ? diphenhydrAMINE HCL (BENADRYL) 12.5 mg/5 mL oral solution Take 5 mL by mouth every 6 hours as needed.   ? divalproex (DEPAKOTE EC) 250 mg DR tablet TAKE THREE TABLETS BY MOUTH DAILY. TAKE WITH FOOD.   ? ezetimibe (ZETIA) 10 mg tablet Take one tablet by mouth daily.   ? famotidine (PEPCID) 20 mg tablet Take one tablet by mouth daily as needed.   ? magnesium oxide (MAGOX) 400 mg (241.3 mg magnesium) tablet Take one tablet by mouth daily.   ? multivit, Ca, min-FA-soy isofl 400-60 mcg-mg tab Take 1 tablet by mouth twice daily.   ? mupirocin (CENTANY) 2 % topical ointment Apply  topically to affected area twice daily.   ? nebivoloL (BYSTOLIC) 5 mg tablet Take one tablet by mouth daily.   ? ondansetron (ZOFRAN ODT) 4 mg rapid dissolve tablet Dissolve two tablets by mouth every 8 hours as needed for Nausea or Vomiting. Place on tongue to dissolve.   ? ondansetron (ZOFRAN ODT) 4 mg rapid dissolve tablet Dissolve one tablet by mouth every 8 hours as needed. Place on tongue to disolve.   ? ondansetron HCL (ZOFRAN) 4 mg tablet Take one tablet by mouth every 8 hours as needed for Nausea or Vomiting.   ? oxyCODONE (ROXICODONE) 5 mg tablet Take one tablet to two tablets by mouth every 6 hours as needed for Pain.   ? pantoprazole DR (PROTONIX) 40 mg tablet TAKE 1 TABLET BY MOUTH EVERY DAY (Patient taking differently: Take one tablet by mouth as Needed.)   ? Potassium 99 mg tab Take one tablet by mouth three times weekly.   ? prochlorperazine maleate (COMPAZINE) 10 mg tablet Take one tablet by mouth every 6 hours as needed for Nausea or Vomiting.   ? riboflavin (vitamin B2) 400 mg tab Take one tablet by mouth daily.   ? rosuvastatin (CRESTOR) 20 mg tablet Take one tablet by mouth daily.   ? sertraline (ZOLOFT) 25 mg tablet TAKE ONE TABLET BY MOUTH DAILY. PLEASE TAKE WITH 50MG  TABLETS FOR TOTAL DAILY DOSE OF 75MG    ? sertraline (ZOLOFT) 50 mg tablet Take one tablet by  mouth daily. Please take with 25mg  tablet for total daily dose of 75mg    ? SUMAtriptan succinate (IMITREX) 100 mg tablet Take one tablet by mouth at onset of headache. May repeat after 2 hours if needed. Max of 200 mg in 24 hours.  Limit use to less than 10 tablets per month.   ? verapamil (CALAN) 40 mg tablet Take 1/2 tablet by mouth in the morning and 1 tablet in the evening   ? vitamins, multi w/minerals 9 mg iron-400 mcg tab Take one tablet by mouth daily with breakfast.     There were no vitals filed for this visit.  There is no height or weight on file to calculate BMI.     Physical Exam  Incisions healing well, sutures removed  Cannot get palm flat right now         Assessment and Plan:  Status post tenolysis  Sutures removed, Steri-Strips placed  Patient encouraged to be aggressive on range of motion and therapy  Follow-up in 6 weeks
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
## 2021-12-05 ENCOUNTER — Ambulatory Visit: Admit: 2021-12-05 | Discharge: 2021-12-06 | Payer: BC Managed Care – PPO

## 2021-12-05 ENCOUNTER — Encounter: Admit: 2021-12-05 | Discharge: 2021-12-05 | Payer: BC Managed Care – PPO

## 2021-12-05 VITALS — BP 126/66 | HR 68 | Ht 65.0 in | Wt 239.0 lb

## 2021-12-05 DIAGNOSIS — M1990 Unspecified osteoarthritis, unspecified site: Secondary | ICD-10-CM

## 2021-12-05 DIAGNOSIS — R0600 Dyspnea, unspecified: Secondary | ICD-10-CM

## 2021-12-05 DIAGNOSIS — M549 Dorsalgia, unspecified: Secondary | ICD-10-CM

## 2021-12-05 DIAGNOSIS — N3010 Interstitial cystitis (chronic) without hematuria: Secondary | ICD-10-CM

## 2021-12-05 DIAGNOSIS — T753XXA Motion sickness, initial encounter: Secondary | ICD-10-CM

## 2021-12-05 DIAGNOSIS — J449 Chronic obstructive pulmonary disease, unspecified: Secondary | ICD-10-CM

## 2021-12-05 DIAGNOSIS — R42 Dizziness and giddiness: Secondary | ICD-10-CM

## 2021-12-05 DIAGNOSIS — F99 Mental disorder, not otherwise specified: Secondary | ICD-10-CM

## 2021-12-05 DIAGNOSIS — G4733 Obstructive sleep apnea (adult) (pediatric): Secondary | ICD-10-CM

## 2021-12-05 DIAGNOSIS — G44039 Episodic paroxysmal hemicrania, not intractable: Secondary | ICD-10-CM

## 2021-12-05 DIAGNOSIS — I10 Essential (primary) hypertension: Secondary | ICD-10-CM

## 2021-12-05 DIAGNOSIS — G5601 Carpal tunnel syndrome, right upper limb: Secondary | ICD-10-CM

## 2021-12-05 DIAGNOSIS — F341 Dysthymic disorder: Principal | ICD-10-CM

## 2021-12-05 DIAGNOSIS — J302 Other seasonal allergic rhinitis: Secondary | ICD-10-CM

## 2021-12-05 DIAGNOSIS — E7800 Pure hypercholesterolemia, unspecified: Secondary | ICD-10-CM

## 2021-12-05 DIAGNOSIS — Z720 Tobacco use: Secondary | ICD-10-CM

## 2021-12-05 DIAGNOSIS — G43909 Migraine, unspecified, not intractable, without status migrainosus: Secondary | ICD-10-CM

## 2021-12-05 DIAGNOSIS — R519 Generalized headaches: Secondary | ICD-10-CM

## 2021-12-05 DIAGNOSIS — K859 Acute pancreatitis without necrosis or infection, unspecified: Secondary | ICD-10-CM

## 2021-12-05 MED ORDER — SERTRALINE 25 MG PO TAB
25 mg | ORAL_TABLET | Freq: Every day | ORAL | 2 refills | Status: AC
Start: 2021-12-05 — End: ?

## 2021-12-05 MED ORDER — BUPROPION SR 100 MG PO TBSR
100 mg | ORAL_TABLET | Freq: Two times a day (BID) | ORAL | 2 refills | Status: AC
Start: 2021-12-05 — End: ?

## 2021-12-05 MED ORDER — SERTRALINE 50 MG PO TAB
50 mg | ORAL_TABLET | Freq: Every day | ORAL | 3 refills | Status: AC
Start: 2021-12-05 — End: ?
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
## 2021-12-05 NOTE — Patient Instructions
It was nice to see you in clinic today!    If you have any questions, you may reach the clinic by calling [phone] or, if you have MyChart access, can message me through your chart.    Brief Summary of today's visit:  No major changes, we'll increase the Wellbutrin to twice a day. If you feel like you're getting angrier and more irritable, only take it once a day.  Next visit will be a longer visit so we can do some talk therapy.    We will see you back in about 3 months for follow up.    Please call the clinic at [phone] to reschedule or cancel appointments if a conflict with your schedule arises. For all appointments, please arrive 15 minutes prior to the start of your visit to facilitate rooming and other necessary check-in procedures. If you are not able to check-in on time, you may need to reschedule.    Please do not hesitate to call the clinic or contact us via MyChart if you have a change in your symptoms, if you have questions regarding your psychiatric medications, or if you start to develop side effects to medications prescribed by your psychiatrist. You can activate your MyChart account by visiting the website at [email or web address].     - Dr. Robb Matar    Our clinic is dedicated to making sure your prescriptions are filled in a timely manner during regular business hours (8am-5pm).  If you need a medication refill, please call your pharmacy to request a refill.  Please make sure to request refills at least 72 hours in advance of your last dose.  Your pharmacy will reach out to [place] electronically, which is the preferred method.  Please try to refrain from paging the on-call psychiatrist regarding medication refills (including controlled substances), as you may not receive a refill or a full refill at that time.    [facility] provides its patients with 24/7 care. If you are concerned about an impending psychiatric emergency (i.e. if you have any concerns about risk of harm to yourself, risk of harm to others or feel unsure about your ability to care for yourself), you may reach out to the overnight psychiatrist on-call by calling the main [facility] operator line outside of normal business hours ([phone]). If you are unsure if you are experiencing a psychiatric emergency, please ask the operator to page the on-call psychiatrist for clarification.    In the event of a safety concern or suicidal thoughts, call 911 or present to the nearest hospital emergency department. National Suicide Prevention Lifeline 463-668-8649 (TALK), or you may also dial 988. The Crisis Text Hotline (text 863-038-1775) is always available by SMS/Text. The [facility] phone line is a resource for talk support in non-emergency situations (1-866-WARMEAR).    If you have received a referral for therapy services through [facility], please request an intake packet at the soonest convenience to facilitate the intake process.

## 2021-12-06 DIAGNOSIS — F418 Other specified anxiety disorders: Secondary | ICD-10-CM

## 2021-12-06 DIAGNOSIS — F4310 Post-traumatic stress disorder, unspecified: Secondary | ICD-10-CM

## 2021-12-31 ENCOUNTER — Encounter: Admit: 2021-12-31 | Discharge: 2021-12-31 | Payer: BC Managed Care – PPO

## 2021-12-31 DIAGNOSIS — F4310 Post-traumatic stress disorder, unspecified: Secondary | ICD-10-CM

## 2021-12-31 DIAGNOSIS — F341 Dysthymic disorder: Secondary | ICD-10-CM

## 2021-12-31 DIAGNOSIS — F418 Other specified anxiety disorders: Secondary | ICD-10-CM

## 2021-12-31 MED ORDER — BUPROPION SR 100 MG PO TBSR
1 refills
Start: 2021-12-31 — End: ?

## 2021-12-31 MED ORDER — SERTRALINE 50 MG PO TAB
50 mg | ORAL_TABLET | Freq: Every day | ORAL | 2 refills
Start: 2021-12-31 — End: ?

## 2022-01-01 ENCOUNTER — Encounter: Admit: 2022-01-01 | Discharge: 2022-01-01 | Payer: BC Managed Care – PPO

## 2022-01-01 ENCOUNTER — Ambulatory Visit: Admit: 2022-01-01 | Discharge: 2022-01-02 | Payer: BC Managed Care – PPO

## 2022-01-01 DIAGNOSIS — K859 Acute pancreatitis without necrosis or infection, unspecified: Secondary | ICD-10-CM

## 2022-01-01 DIAGNOSIS — R519 Generalized headaches: Secondary | ICD-10-CM

## 2022-01-01 DIAGNOSIS — M549 Dorsalgia, unspecified: Secondary | ICD-10-CM

## 2022-01-01 DIAGNOSIS — G4733 Obstructive sleep apnea (adult) (pediatric): Secondary | ICD-10-CM

## 2022-01-01 DIAGNOSIS — M1990 Unspecified osteoarthritis, unspecified site: Secondary | ICD-10-CM

## 2022-01-01 DIAGNOSIS — G5601 Carpal tunnel syndrome, right upper limb: Secondary | ICD-10-CM

## 2022-01-01 DIAGNOSIS — G44039 Episodic paroxysmal hemicrania, not intractable: Secondary | ICD-10-CM

## 2022-01-01 DIAGNOSIS — E7800 Pure hypercholesterolemia, unspecified: Secondary | ICD-10-CM

## 2022-01-01 DIAGNOSIS — R0600 Dyspnea, unspecified: Secondary | ICD-10-CM

## 2022-01-01 DIAGNOSIS — G43909 Migraine, unspecified, not intractable, without status migrainosus: Secondary | ICD-10-CM

## 2022-01-01 DIAGNOSIS — F99 Mental disorder, not otherwise specified: Secondary | ICD-10-CM

## 2022-01-01 DIAGNOSIS — J449 Chronic obstructive pulmonary disease, unspecified: Secondary | ICD-10-CM

## 2022-01-01 DIAGNOSIS — I10 Essential (primary) hypertension: Secondary | ICD-10-CM

## 2022-01-01 DIAGNOSIS — R42 Dizziness and giddiness: Secondary | ICD-10-CM

## 2022-01-01 DIAGNOSIS — Z720 Tobacco use: Secondary | ICD-10-CM

## 2022-01-01 DIAGNOSIS — M7701 Medial epicondylitis, right elbow: Secondary | ICD-10-CM

## 2022-01-01 DIAGNOSIS — M65341 Trigger finger, right ring finger: Secondary | ICD-10-CM

## 2022-01-01 DIAGNOSIS — M65351 Trigger finger, right little finger: Secondary | ICD-10-CM

## 2022-01-01 DIAGNOSIS — N3010 Interstitial cystitis (chronic) without hematuria: Secondary | ICD-10-CM

## 2022-01-01 DIAGNOSIS — T753XXA Motion sickness, initial encounter: Secondary | ICD-10-CM

## 2022-01-01 DIAGNOSIS — J302 Other seasonal allergic rhinitis: Secondary | ICD-10-CM

## 2022-01-01 DIAGNOSIS — M7711 Lateral epicondylitis, right elbow: Secondary | ICD-10-CM

## 2022-01-01 MED ORDER — LIDOCAINE-EPINEPHRINE 1 %-1:100,000 IJ SOLN
1 mL | Freq: Once | 0 refills | Status: CP
Start: 2022-01-01 — End: ?
  Administered 2022-01-01: 21:00:00 1 mL

## 2022-01-01 MED ORDER — TRIAMCINOLONE ACETONIDE 40 MG/ML IJ SUSP
40 mg | Freq: Once | INTRAMUSCULAR | 0 refills | Status: CP
Start: 2022-01-01 — End: ?
  Administered 2022-01-01: 21:00:00 40 mg via INTRAMUSCULAR

## 2022-01-01 NOTE — Progress Notes
Date of Service: 01/01/2022    Subjective:             Christina Garrison is a 51 y.o. female.    History of Present Illness  Here to follow-up.  Continues to work aggressively in therapy.  Struggling a fair bit with ring finger in particular.     Review of Systems   All other systems reviewed and are negative.        Objective:         ? biotin 5,000 mcg TbDi Dissolve 2 tablets by mouth twice daily.   ? buPROPion HCL SR (WELLBUTRIN SR) 100 mg tablet, 12 hr sustained-release TAKE 1 TABLET BY MOUTH TWICE A DAY   ? cholecalciferol (VITAMIN D-3) 1,000 units tablet Take one tablet by mouth twice weekly.   ? clonazePAM (KLONOPIN) 0.5 mg tablet Take 1.5 tablets by mouth twice daily for 60 days.   ? clonazePAM (KLONOPIN) 1 mg tablet Take 1 tablet as needed for driving   ? cyanocobalamin (vitamin B-12) 2,500 mcg tab Take one tablet by mouth daily with breakfast.   ? diphenhydrAMINE HCL (BENADRYL) 12.5 mg/5 mL oral solution Take 5 mL by mouth every 6 hours as needed.   ? divalproex (DEPAKOTE EC) 250 mg DR tablet TAKE THREE TABLETS BY MOUTH DAILY. TAKE WITH FOOD.   ? ezetimibe (ZETIA) 10 mg tablet Take one tablet by mouth daily.   ? famotidine (PEPCID) 20 mg tablet Take one tablet by mouth daily as needed.   ? magnesium oxide (MAGOX) 400 mg (241.3 mg magnesium) tablet Take one tablet by mouth daily.   ? multivit, Ca, min-FA-soy isofl 400-60 mcg-mg tab Take 1 tablet by mouth twice daily.   ? mupirocin (CENTANY) 2 % topical ointment Apply  topically to affected area twice daily.   ? nebivoloL (BYSTOLIC) 5 mg tablet Take one tablet by mouth daily.   ? ondansetron (ZOFRAN ODT) 4 mg rapid dissolve tablet Dissolve two tablets by mouth every 8 hours as needed for Nausea or Vomiting. Place on tongue to dissolve.   ? ondansetron (ZOFRAN ODT) 4 mg rapid dissolve tablet Dissolve one tablet by mouth every 8 hours as needed. Place on tongue to disolve.   ? pantoprazole DR (PROTONIX) 40 mg tablet TAKE 1 TABLET BY MOUTH EVERY DAY (Patient taking differently: Take one tablet by mouth as Needed.)   ? Potassium 99 mg tab Take one tablet by mouth three times weekly.   ? prochlorperazine maleate (COMPAZINE) 10 mg tablet Take one tablet by mouth every 6 hours as needed for Nausea or Vomiting.   ? riboflavin (vitamin B2) 400 mg tab Take one tablet by mouth daily.   ? rosuvastatin (CRESTOR) 20 mg tablet Take one tablet by mouth daily.   ? sertraline (ZOLOFT) 25 mg tablet Take one tablet by mouth daily. Please take with 50mg  tablets for total daily dose of 75mg    ? sertraline (ZOLOFT) 50 mg tablet TAKE ONE TABLET BY MOUTH DAILY. PLEASE TAKE WITH 25MG  TABLET FOR TOTAL DAILY DOSE OF 75MG    ? SUMAtriptan succinate (IMITREX) 100 mg tablet Take one tablet by mouth at onset of headache. May repeat after 2 hours if needed. Max of 200 mg in 24 hours.  Limit use to less than 10 tablets per month.   ? verapamil (CALAN) 40 mg tablet Take 1/2 tablet by mouth in the morning and 1 tablet in the evening   ? vitamins, multi w/minerals 9 mg iron-400 mcg tab Take one  tablet by mouth daily with breakfast.     Vitals:    01/01/22 1532   BP: (!) 144/71   Pulse: 75   Weight: 104.3 kg (230 lb)   Height: 165.1 cm (5' 5)     Body mass index is 38.27 kg/m?Marland Kitchen     Physical Exam  Right hand, ring finger tethered by scar, feels almost like a Dupuytren's cord running from the digital palmar crease to the distal portion of the transverse carpal ligament, MCP joint with about 25 degree flexion contracture       Assessment and Plan:  Right hand Dupuytren's flare/palmar fibromatosis after trigger finger release  Discussed further care.  I think at this point trying a round of cortisone injections to see if we and soften the scar is our best option.  She agreed.    With consent prepped her right hand with alcohol.  1 mL of 1% lidocaine mixed with 1 mL of Kenalog 40 was injected into the scar via multiple puncture sites.  Patient tolerated well.  Follow-up in 4 weeks for likely another injection.

## 2022-01-02 DIAGNOSIS — M65331 Trigger finger, right middle finger: Secondary | ICD-10-CM

## 2022-01-02 DIAGNOSIS — G5621 Lesion of ulnar nerve, right upper limb: Secondary | ICD-10-CM

## 2022-01-02 DIAGNOSIS — M720 Palmar fascial fibromatosis [Dupuytren]: Secondary | ICD-10-CM

## 2022-01-06 ENCOUNTER — Encounter: Admit: 2022-01-06 | Discharge: 2022-01-06 | Payer: BC Managed Care – PPO

## 2022-01-29 ENCOUNTER — Encounter: Admit: 2022-01-29 | Discharge: 2022-01-29 | Payer: BC Managed Care – PPO

## 2022-01-29 ENCOUNTER — Ambulatory Visit: Admit: 2022-01-29 | Discharge: 2022-01-29 | Payer: BC Managed Care – PPO

## 2022-01-29 DIAGNOSIS — K859 Acute pancreatitis without necrosis or infection, unspecified: Secondary | ICD-10-CM

## 2022-01-29 DIAGNOSIS — R519 Generalized headaches: Secondary | ICD-10-CM

## 2022-01-29 DIAGNOSIS — T753XXA Motion sickness, initial encounter: Secondary | ICD-10-CM

## 2022-01-29 DIAGNOSIS — I10 Essential (primary) hypertension: Secondary | ICD-10-CM

## 2022-01-29 DIAGNOSIS — G4733 Obstructive sleep apnea (adult) (pediatric): Secondary | ICD-10-CM

## 2022-01-29 DIAGNOSIS — R42 Dizziness and giddiness: Secondary | ICD-10-CM

## 2022-01-29 DIAGNOSIS — N3010 Interstitial cystitis (chronic) without hematuria: Secondary | ICD-10-CM

## 2022-01-29 DIAGNOSIS — G5601 Carpal tunnel syndrome, right upper limb: Secondary | ICD-10-CM

## 2022-01-29 DIAGNOSIS — F99 Mental disorder, not otherwise specified: Secondary | ICD-10-CM

## 2022-01-29 DIAGNOSIS — J449 Chronic obstructive pulmonary disease, unspecified: Secondary | ICD-10-CM

## 2022-01-29 DIAGNOSIS — R0600 Dyspnea, unspecified: Secondary | ICD-10-CM

## 2022-01-29 DIAGNOSIS — E7800 Pure hypercholesterolemia, unspecified: Secondary | ICD-10-CM

## 2022-01-29 DIAGNOSIS — M549 Dorsalgia, unspecified: Secondary | ICD-10-CM

## 2022-01-29 DIAGNOSIS — G44039 Episodic paroxysmal hemicrania, not intractable: Secondary | ICD-10-CM

## 2022-01-29 DIAGNOSIS — G43909 Migraine, unspecified, not intractable, without status migrainosus: Secondary | ICD-10-CM

## 2022-01-29 DIAGNOSIS — Z720 Tobacco use: Secondary | ICD-10-CM

## 2022-01-29 DIAGNOSIS — J302 Other seasonal allergic rhinitis: Secondary | ICD-10-CM

## 2022-01-29 DIAGNOSIS — M24541 Contracture, right hand: Secondary | ICD-10-CM

## 2022-01-29 DIAGNOSIS — M1990 Unspecified osteoarthritis, unspecified site: Secondary | ICD-10-CM

## 2022-01-29 NOTE — Progress Notes
Subjective:       Patient is here to follow-up.  She thinks maybe there is a slight improvement after the cortisone injection a month ago.  Overall things have not changed substantially.    Christina Garrison is a 51 y.o. female.       Review of Systems   Constitutional: Negative.    HENT: Negative.    Eyes: Negative.    Respiratory: Negative.    Cardiovascular: Negative.    Gastrointestinal: Negative.    Endocrine: Negative.    Genitourinary: Negative.    Musculoskeletal: Negative.    Skin: Negative.    Allergic/Immunologic: Negative.    Neurological: Negative.    Hematological: Negative.    Psychiatric/Behavioral: Negative.          Objective:         ? biotin 5,000 mcg TbDi Dissolve 2 tablets by mouth twice daily.   ? buPROPion HCL SR (WELLBUTRIN SR) 100 mg tablet, 12 hr sustained-release TAKE 1 TABLET BY MOUTH TWICE A DAY   ? cholecalciferol (VITAMIN D-3) 1,000 units tablet Take one tablet by mouth twice weekly.   ? clonazePAM (KLONOPIN) 1 mg tablet Take 1 tablet as needed for driving   ? cyanocobalamin (vitamin B-12) 2,500 mcg tab Take one tablet by mouth daily with breakfast.   ? diphenhydrAMINE HCL (BENADRYL) 12.5 mg/5 mL oral solution Take 5 mL by mouth every 6 hours as needed.   ? divalproex (DEPAKOTE EC) 250 mg DR tablet TAKE THREE TABLETS BY MOUTH DAILY. TAKE WITH FOOD.   ? ezetimibe (ZETIA) 10 mg tablet Take one tablet by mouth daily.   ? famotidine (PEPCID) 20 mg tablet Take one tablet by mouth daily as needed.   ? magnesium oxide (MAGOX) 400 mg (241.3 mg magnesium) tablet Take one tablet by mouth daily.   ? multivit, Ca, min-FA-soy isofl 400-60 mcg-mg tab Take 1 tablet by mouth twice daily.   ? mupirocin (CENTANY) 2 % topical ointment Apply  topically to affected area twice daily.   ? nebivoloL (BYSTOLIC) 5 mg tablet Take one tablet by mouth daily.   ? ondansetron (ZOFRAN ODT) 4 mg rapid dissolve tablet Dissolve two tablets by mouth every 8 hours as needed for Nausea or Vomiting. Place on tongue to dissolve.   ? pantoprazole DR (PROTONIX) 40 mg tablet TAKE 1 TABLET BY MOUTH EVERY DAY (Patient taking differently: Take one tablet by mouth as Needed.)   ? Potassium 99 mg tab Take one tablet by mouth three times weekly.   ? prochlorperazine maleate (COMPAZINE) 10 mg tablet Take one tablet by mouth every 6 hours as needed for Nausea or Vomiting.   ? riboflavin (vitamin B2) 400 mg tab Take one tablet by mouth daily.   ? rosuvastatin (CRESTOR) 20 mg tablet Take one tablet by mouth daily.   ? sertraline (ZOLOFT) 25 mg tablet Take one tablet by mouth daily. Please take with 50mg  tablets for total daily dose of 75mg    ? sertraline (ZOLOFT) 50 mg tablet TAKE ONE TABLET BY MOUTH DAILY. PLEASE TAKE WITH 25MG  TABLET FOR TOTAL DAILY DOSE OF 75MG    ? SUMAtriptan succinate (IMITREX) 100 mg tablet Take one tablet by mouth at onset of headache. May repeat after 2 hours if needed. Max of 200 mg in 24 hours.  Limit use to less than 10 tablets per month.   ? verapamil (CALAN) 40 mg tablet Take 1/2 tablet by mouth in the morning and 1 tablet in the evening   ?  vitamins, multi w/minerals 9 mg iron-400 mcg tab Take one tablet by mouth daily with breakfast.     Vitals:    01/29/22 1530   BP: (!) 151/80   Pulse: 63   PainSc: Zero   Weight: 104.3 kg (230 lb)   Height: 165.1 cm (5' 5)     Body mass index is 38.27 kg/m?Marland Kitchen     Physical Exam  Physical Exam   Constitutional: Patient is oriented to person, place, and time. Patient appears well-developed and well-nourished.   HENT:   Head: Normocephalic and atraumatic.   Eyes: EOM are normal. Pupils are equal, round, and reactive to light.   Neck: Normal range of motion.   Cardiovascular: Normal rate, regular rhythm, normal heart sounds and intact distal pulses.    Pulmonary/Chest: Effort normal and breath sounds normal.   Neurological: Patient is alert and oriented to person, place, and time.   Skin: Skin is warm and dry. Capillary refill takes less than 2 seconds.   Psychiatric: Patient has a normal mood and affect. Behavior is normal. Judgment and thought content normal.     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  We tried to avoid it by having her set up for therapy after her first operation, but the therapist was worried about moving someone with fresh incision and so she did not get the kind of therapy that she needed.  We are now fighting with substantial scar tissue.  I told her at this point releasing her MCP joint from the volar aspect as well as doing a tenolysis is the only option that I think can improve upon her function.  She would need early therapy after surgery.  She would also need straight extension splinting of her small and ring fingers to try to keep her MCP joint from falling back into its contracted position from before.  Risks of bleeding, infection, scarring, nerve injury, recurrent flexion contracture, need for further procedures were described.  Her questions were answered.  We will schedule at her convenience.      [address], [phone]  Right ring finger MCP joint capsulectomy from volar approach, ring finger flexor tenolysis  Regional anesthesia  60 minutes  Outpatient  Start therapy 2 or 3 days postop, therapy to do active and passive range of motion, fabricate straight extension splint for small and ring fingers to keep MCPs in full extension  Follow-up with me in 2 weeks

## 2022-02-03 ENCOUNTER — Ambulatory Visit: Admit: 2022-02-03 | Discharge: 2022-02-03 | Payer: BC Managed Care – PPO

## 2022-02-03 ENCOUNTER — Encounter: Admit: 2022-02-03 | Discharge: 2022-02-03 | Payer: BC Managed Care – PPO

## 2022-02-03 DIAGNOSIS — M24541 Contracture, right hand: Secondary | ICD-10-CM

## 2022-02-03 NOTE — Progress Notes
[facility] phone triage completed with patient for surgery on 02/20/22 with Dr. Felecia Shelling.  Medications, allergies and medical history reviewed and updated in chart. Denies URI symptoms at this time.    Copy of instructions were placed in MyChart.

## 2022-02-03 NOTE — Pre-Anesthesia Patient Instructions
PREPROCEDURE INFORMATION    Arrival at the hospital  [facility]  [address]  [place], [place] [address]    Park in the [employer], located directly across from the main entrance to the hospital.  Enter through the ground floor main hospital entrance and check in at the Information Desk in the lobby.  They will validate your parking ticket and direct you to the next location.  If you are a woman between the ages of 67 and 11, and have not had a hysterectomy, you will be asked for a urine sample prior to surgery.  Please do not urinate before arriving in the Surgery Waiting Room.  Once there, check in and let the attendant know if you need to provide a sample.  Phone carriers that use spam blockers will sometimes block our phone numbers. If your phone contact number is a mobile phone, please adjust your settings to make sure you receive our call.  In your phone settings, turn OFF the setting ?silence unknown callers.?  Please add these phone numbers to your contacts [phone] & [phone]     You will receive a call with your surgery arrival time between 2:30pm and 4:30pm the last business day before your procedure.  If you do not receive a call, please call [phone] before 4:30pm or [phone] after 4:30pm.    Eating or drinking before surgery  Do not eat or drink anything after 11:00 p.m. the day before your procedure (including gum, mints, candy, or chewing tobacco) OR follow the specific instructions you were given by your Surgeon.  You may have WATER ONLY up to 2 hours before arriving at the hospital.    Planning transportation for outpatient procedure  For your safety, you will need to arrange for a responsible ride/person to accompany you home due to sedation or anesthesia with your procedure.  An [employer], taxi or other public transportation driver is not considered a responsible person to accompany you home.    Bath/Shower Instructions  Take a bath or shower with antibacterial soap the night before and the morning of your procedure. Use clean towels.  Put on clean clothes after bath or shower.  Avoid using lotion and oils.  If you are having surgery above the waist, wear a shirt that fastens up the front.  Sleep on clean sheets if bath or shower is done the night before procedure.    Morning of your procedure:  Brush your teeth and tongue  Do not smoke, vape, chew or user any tobacco products.  Do not shave the area where you will have surgery.  Remove nail polish, makeup and all jewelry (including piercings) before coming to the hospital.  Dress in clean, loose, comfortable clothing.    Valuables  Leave money, credit cards, jewelry, and any other valuables at home. [facility] is not responsible for the loss or breakage of personal items.    What to bring to the hospital  ID/Insurance card  Medical Device card  Official documents for legal guardianship  Copy of your Living Will, Advanced Directives, and/or Durable Power of Attorney. If you have these documents, please bring them to the admissions office on the day of your surgery to be scanned into your records.  Do not bring medications from home unless instructed by a pharmacist.  Cases for glasses/hearing aids/contact lens (bring solutions for contacts)     Notify us at [facility]: [phone] on the day  of your procedure if:  You need to cancel your procedure.  You are going to be late.    Notify your surgeon if:  There is a possibility that you are pregnant.  You become ill with a cough, fever, sore throat, nausea, vomiting or flu-like symptoms.  You have any open wounds/sores that are red, painful, draining, or are new since you last saw the doctor.  You need to cancel your procedure.    Preparing to get your medications at discharge  Your surgeon may prescribe you medications to take after your procedure.  If you like the convenience of having your medications filled here at [facility], please do the following:  Go to [facility] pharmacy after your [facility] appointment to put a credit card on file.  Call [facility] pharmacy at [phone] (Monday-Friday 7am-9pm or Saturday and Sunday 9am-5pm) to put a credit card on file.  Bring a credit card or cash on the day of your procedure- please leave with a family member rather than bringing it into the preop area.    Current Visitor Policy:  Visitors must be free of fever and symptoms to be in our facilities.  No more than 2 visitors per patient are allowed.  Additional guidelines may vary, based on patient care area or patient's condition.  Patients in semiprivate rooms may have visitors, but visits should be coordinated so only two total visitors are in a room at a time due to space limitations.  Children younger than age 98 are allowed to visit inpatients.    Thank you for participating in your Preoperative Assessment Clinic visit today.    If you have any changes to your health or hospitalizations between now and your surgery, please call us at [phone] for Main instructions.    Sahar, it was a pleasure speaking with you today.  These are the instructions we discussed.  If you have any changes in your health or questions about your instructions, please reach out to me via email or phone call.     Debbie, RN   [phone]  [email or web address]

## 2022-02-03 NOTE — Pre-Anesthesia Medication Instructions
YOUR MEDICATION LIST     biotin 5,000 mcg TbDi Dissolve 2 tablets by mouth twice daily.    buPROPion HCL SR (WELLBUTRIN SR) 100 mg tablet, 12 hr sustained-release TAKE 1 TABLET BY MOUTH TWICE A DAY    cholecalciferol (VITAMIN D-3) 1,000 units tablet Take one tablet by mouth twice weekly.    clonazePAM (KLONOPIN) 1 mg tablet Take 1 tablet as needed for driving    cyanocobalamin (vitamin B-12) 2,500 mcg tab Take one tablet by mouth daily with breakfast.    diphenhydrAMINE HCL (BENADRYL) 12.5 mg/5 mL oral solution Take 5 mL by mouth every 6 hours as needed.    divalproex (DEPAKOTE EC) 250 mg DR tablet TAKE THREE TABLETS BY MOUTH DAILY. TAKE WITH FOOD.    ezetimibe (ZETIA) 10 mg tablet Take one tablet by mouth daily.    famotidine (PEPCID) 20 mg tablet Take one tablet by mouth daily as needed.    magnesium oxide (MAGOX) 400 mg (241.3 mg magnesium) tablet Take one tablet by mouth daily.    multivit, Ca, min-FA-soy isofl 400-60 mcg-mg tab Take 1 tablet by mouth twice daily.    mupirocin (CENTANY) 2 % topical ointment Apply  topically to affected area twice daily.    nebivoloL (BYSTOLIC) 5 mg tablet Take one tablet by mouth daily.    ondansetron (ZOFRAN ODT) 4 mg rapid dissolve tablet Dissolve two tablets by mouth every 8 hours as needed for Nausea or Vomiting. Place on tongue to dissolve.    pantoprazole DR (PROTONIX) 40 mg tablet TAKE 1 TABLET BY MOUTH EVERY DAY (Patient taking differently: Take one tablet by mouth as Needed.)    Potassium 99 mg tab Take one tablet by mouth three times weekly.    prochlorperazine maleate (COMPAZINE) 10 mg tablet Take one tablet by mouth every 6 hours as needed for Nausea or Vomiting.    riboflavin (vitamin B2) 400 mg tab Take one tablet by mouth daily.    rosuvastatin (CRESTOR) 20 mg tablet Take one tablet by mouth daily.    sertraline (ZOLOFT) 25 mg tablet Take one tablet by mouth daily. Please take with 50mg  tablets for total daily dose of 75mg     sertraline (ZOLOFT) 50 mg tablet TAKE ONE TABLET BY MOUTH DAILY. PLEASE TAKE WITH 25MG  TABLET FOR TOTAL DAILY DOSE OF 75MG     SUMAtriptan succinate (IMITREX) 100 mg tablet Take one tablet by mouth at onset of headache. May repeat after 2 hours if needed. Max of 200 mg in 24 hours.  Limit use to less than 10 tablets per month.    verapamil (CALAN) 40 mg tablet Take 1/2 tablet by mouth in the morning and 1 tablet in the evening    vitamins, multi w/minerals 9 mg iron-400 mcg tab Take one tablet by mouth daily with breakfast.         YOUR MEDICATION INSTRUCTIONS FOR SURGERY    Please continue taking your medicine as your doctor has told you to UNLESS it is listed to stop below.      14 DAYS BEFORE SURGERY  Do not take the following vitamins, herbals, and supplements:  Biotin  Multivitamin w/ Iron  Multivitamin  Do not start any new vitamins, herbals, or natural supplements before surgery.      7 DAYS BEFORE SURGERY  DO NOT TAKE the following anti-inflammatory medications such as ibuprofen (Advil, Motrin) and naproxen (Aleve)  You may use acetaminophen (Tylenol)      DAY BEFORE SURGERY  TAKE your medications the day  before surgery as usual unless told differently      MORNING OF SURGERY  DO NOT take these medications:  Remaining vitamins/supplements  Ointments/creams/lotions  Ezetimibe  Sumatriptan  Verapamil      TAKE these medications with a sip (1-2 ounces) of water:  Bupropion  Divalproex  Nebivolol  Rosuvastatin  Sertraline  Use all inhalers, nasal sprays, and eye drops as usual  May take if needed:  Clonazepam  Diphenhydramine  Famotidine -or- Pantoprazole  Ondansetron -or- Prochlorperazine      Please contact Debbie, RN, with any changes to your medication list or medication questions before surgery.  E-mail:  [email or web address]  Phone: [phone]    Before going home from the hospital, please ask your doctor when you should re-start any medicine that was stopped for surgery.

## 2022-02-03 NOTE — Progress Notes
Date of Service: 02/05/2022    Subjective:          Christina Garrison is a 51 y.o. female emphysema, mild OSA on Bi-PAP (AHI 12.3/hr) , chronic headaches w/ occipital neuralgia, obesity s/p gastric sleeve 2015 (complicated by bilateral pleural effusions and extended hospitalization), GERD, arthritis, former tobacco use, who presents to clinic for Bi-PAP compliance update. She was last seen 10/17/2020.    Today she reports she has not been wearing her Bi-PAP because she has had multiple surgeries that have interfered with her ability to tolerate Bi-PAP. She had a breast reduction last year and had issues with impaired healing, infection, dehiscence which required a wound vac. She has also had hand surgeries for carpal tunnel and trigger finger. She also switched insurance and just recently got all her Bi-PAP supplies and feels ready to start her Bi-PAP again.     She also has an acute complaint of cough. She reports that she started coughing ~3-4 weeks ago. It is a dry cough. It started with a cold with additional symptoms of headache, sore throat and congestion. Now all those symptoms have resolved, but the cough persists. Denies sick contacts. She has had a negative COVID test at home. She denies fever, chills, night sweats, hemoptysis, weight loss.     She is currently off nicotine replacement therapy and is no longer vaping. She has a 45 pack year smoking history and quit smoking in 2013.      Review of Systems   Constitutional: Positive for fatigue. Negative for activity change, appetite change, chills, diaphoresis and fever.   HENT: Negative for congestion, postnasal drip, rhinorrhea, sinus pressure and sinus pain.    Eyes: Negative.    Respiratory: Positive for apnea. Negative for cough, shortness of breath, wheezing and stridor.    Cardiovascular: Negative for chest pain, palpitations and leg swelling.   Gastrointestinal: Negative for abdominal distention, abdominal pain, blood in stool, constipation and diarrhea. Endocrine: Negative.    Genitourinary: Negative for difficulty urinating, dysuria, frequency and hematuria.   Musculoskeletal: Negative for arthralgias, gait problem, joint swelling and myalgias.   Skin: Negative for color change and rash.   Allergic/Immunologic: Negative for environmental allergies and food allergies.   Neurological: Positive for headaches. Negative for dizziness, syncope and light-headedness.   Psychiatric/Behavioral: Negative for agitation, confusion, decreased concentration and dysphoric mood. The patient is not nervous/anxious.    All other systems reviewed and are negative.      Objective:         ? biotin 5,000 mcg TbDi Dissolve 2 tablets by mouth twice daily.   ? buPROPion HCL SR (WELLBUTRIN SR) 100 mg tablet, 12 hr sustained-release TAKE 1 TABLET BY MOUTH TWICE A DAY   ? cholecalciferol (VITAMIN D-3) 1,000 units tablet Take one tablet by mouth twice weekly.   ? clonazePAM (KLONOPIN) 1 mg tablet Take 1 tablet as needed for driving   ? cyanocobalamin (vitamin B-12) 2,500 mcg tab Take one tablet by mouth daily with breakfast.   ? diphenhydrAMINE HCL (BENADRYL) 12.5 mg/5 mL oral solution Take 5 mL by mouth every 6 hours as needed.   ? divalproex (DEPAKOTE EC) 250 mg DR tablet TAKE THREE TABLETS BY MOUTH DAILY. TAKE WITH FOOD.   ? ezetimibe (ZETIA) 10 mg tablet Take one tablet by mouth daily.   ? famotidine (PEPCID) 20 mg tablet Take one tablet by mouth daily as needed.   ? magnesium oxide (MAGOX) 400 mg (241.3 mg magnesium) tablet Take one tablet  by mouth daily.   ? multivit, Ca, min-FA-soy isofl 400-60 mcg-mg tab Take 1 tablet by mouth twice daily.   ? mupirocin (CENTANY) 2 % topical ointment Apply  topically to affected area twice daily.   ? nebivoloL (BYSTOLIC) 5 mg tablet Take one tablet by mouth daily.   ? nicotine (NICOTROL) 10 mg inhaler Inhale one puff by mouth into the lungs as Needed. Puff by mouth as needed. May use 6-16 cartridges per day as needed for up to 6 months. Do not smoke while using inhaler.   ? ondansetron (ZOFRAN ODT) 4 mg rapid dissolve tablet Dissolve two tablets by mouth every 8 hours as needed for Nausea or Vomiting. Place on tongue to dissolve.   ? pantoprazole DR (PROTONIX) 40 mg tablet TAKE 1 TABLET BY MOUTH EVERY DAY (Patient taking differently: Take one tablet by mouth as Needed.)   ? Potassium 99 mg tab Take one tablet by mouth three times weekly.   ? prochlorperazine maleate (COMPAZINE) 10 mg tablet Take one tablet by mouth every 6 hours as needed for Nausea or Vomiting.   ? riboflavin (vitamin B2) 400 mg tab Take one tablet by mouth daily.   ? rosuvastatin (CRESTOR) 20 mg tablet Take one tablet by mouth daily.   ? sertraline (ZOLOFT) 25 mg tablet Take one tablet by mouth daily. Please take with 50mg  tablets for total daily dose of 75mg    ? sertraline (ZOLOFT) 50 mg tablet TAKE ONE TABLET BY MOUTH DAILY. PLEASE TAKE WITH 25MG  TABLET FOR TOTAL DAILY DOSE OF 75MG    ? SUMAtriptan succinate (IMITREX) 100 mg tablet Take one tablet by mouth at onset of headache. May repeat after 2 hours if needed. Max of 200 mg in 24 hours.  Limit use to less than 10 tablets per month.   ? verapamil (CALAN) 40 mg tablet Take 1/2 tablet by mouth in the morning and 1 tablet in the evening   ? vitamins, multi w/minerals 9 mg iron-400 mcg tab Take one tablet by mouth daily with breakfast.       Vitals:    02/05/22 1541   BP: (!) 137/94   BP Source: Arm, Left Upper   Pulse: 70   Resp: 16   SpO2: 97%   PainSc: Zero   Weight: 102.1 kg (225 lb)   Height: 165.1 cm (5' 5)     Body mass index is 37.44 kg/m?Marland Kitchen     Physical Exam  Vitals and nursing note reviewed.   Constitutional:       General: She is not in acute distress.     Appearance: Normal appearance. She is obese. She is not ill-appearing or toxic-appearing.   HENT:      Head: Normocephalic and atraumatic.      Right Ear: External ear normal.      Left Ear: External ear normal.      Nose: Nose normal.      Mouth/Throat:      Mouth: Mucous membranes are moist.      Pharynx: Oropharynx is clear.   Eyes:      Extraocular Movements: Extraocular movements intact.      Conjunctiva/sclera: Conjunctivae normal.      Pupils: Pupils are equal, round, and reactive to light.   Cardiovascular:      Rate and Rhythm: Normal rate and regular rhythm.      Pulses: Normal pulses.      Heart sounds: Normal heart sounds. No murmur heard.     No friction  rub. No gallop.   Pulmonary:      Effort: Pulmonary effort is normal. No respiratory distress.      Breath sounds: Normal breath sounds. No stridor. No wheezing, rhonchi or rales.   Abdominal:      General: Bowel sounds are normal. There is no distension.      Palpations: Abdomen is soft.      Tenderness: There is no abdominal tenderness.   Musculoskeletal:         General: Normal range of motion.      Cervical back: Normal range of motion. No rigidity. No muscular tenderness.      Right lower leg: No edema.      Left lower leg: No edema.   Lymphadenopathy:      Cervical: No cervical adenopathy.   Skin:     General: Skin is warm and dry.      Capillary Refill: Capillary refill takes less than 2 seconds.      Findings: No rash.   Neurological:      General: No focal deficit present.      Mental Status: She is alert and oriented to person, place, and time. Mental status is at baseline.      Cranial Nerves: No cranial nerve deficit.   Psychiatric:         Mood and Affect: Mood normal.         Behavior: Behavior normal.       Labs:  Abs eos 170 (06/09/19)  CO2 28 (06/09/19)    Imaging:  CT Chest 10/07/18:  1. ?No mediastinal mass, lymphadenopathy or fluid collection. Changes of   gastric sleeve are noted.   2. ?Mild emphysema.   3. ?There are 2 tiny nodules along the left major fissure which likely   represent granulomas or intrapulmonary lymph nodes. If there are   significant risk factors for pulmonary malignancy, a follow-up chest CT in   12 months could be obtained to assess for stability.     HST 08/2019:      PFTs:   07/11/19  FVC 3.19L/92%  SVC 3.36L/97%  FEV1 2.63L/95%  Ratio 78%/ LLN 70%  RV 97%  TLC 98%  DLCO 94%  Normal pulmonary function tests    Assessment & Plan:  Christina Garrison is a 51 y.o. female with radiographic emphysema, OSA on Bi-PAP, chronic headaches w/ occipital neuralgia, obesity s/p gastric sleeve 2015 (complicated by bilateral pleural effusions and extended hospitalization), GERD, arthritis, former nicotine use now in remission, who presents to clinic for Bi-PAP compliance update.    #obstructive sleep apnea syndrome  She has been off her Bi-PAP recently due to multiple breast surgeries requiring a wound vac and carpal tunnel surgery impacting her ability to fit the Bi-PAP. She has also switched insurances and DME companies, but now has all the Bi-PAP supplies and is ready to get started back on NIV.     #centrilobular emphysema w/o obstruction  Mild emphysema on prior imaging, no obstruction on PFTs.  Not currently on any inhalers and no significant dyspnea.    #cough, subacute: in setting of recent viral illness and w/o alarming symptoms. Plan to monitor as suspect this is post-viral cough. If not improving 4-6 weeks following initial infection will consider CXR, trial of LABA-ICS and if not improving repeat PFTs.     #former tobacco use: enrolled in lung cancer screening program    Follow-up in 6 months.     Patient seen and evaluated with Dr.  Thomas.    Anola Gurney, MD

## 2022-02-03 NOTE — Anesthesia Pre-Procedure Evaluation
BMI 39  Anesthesia Pre-Procedure Evaluation    Name: Christina Garrison      MRN: [id number]     DOB: [birth year]     Age: 51 y.o.     Sex: female   _________________________________________________________________________     Procedure Info:   Procedure Information     Date/Time: 02/20/22 1110    Procedures:       CAPSULECTOMY/ CAPSULOTOMY METACARPOPHALANGEAL JOINT - EACH JOINT (Right) - CASE LENGTH 60 MINUTES      TENOLYSIS FLEXOR TENDON PALM AND FINGER - EACH TENDON (Right)    Location: MAIN OR [facility] / Main OR/Periop    Surgeons: Nicky Pugh, MD          Physical Assessment  Vital Signs (last filed in past 24 hours):    Reviewed, see nursing documentation     Patient History   Allergies   Allergen Reactions   ? Effexor [Venlafaxine] SEE COMMENTS     Suicidal thoughts   ? Strawberry ANAPHYLAXIS and HIVES   ? Diovan [Valsartan] HIVES   ? Prozac [Fluoxetine] NAUSEA AND VOMITING and ANXIETY   ? Silver Sulfadiazine RASH   ? Topamax [Topiramate] SEE COMMENTS     Feels like she is going to pass out and speech messed up   ? Decadron [Dexamethasone] NAUSEA AND VOMITING and SEE COMMENTS     Also causes agitation    ? Gabapentin SEE COMMENTS     She can't remember what the reaction was but doesn't want to take it again.        Current Medications    Medication Directions   biotin 5,000 mcg TbDi Dissolve 2 tablets by mouth twice daily.   buPROPion HCL SR (WELLBUTRIN SR) 100 mg tablet, 12 hr sustained-release TAKE 1 TABLET BY MOUTH TWICE A DAY   cholecalciferol (VITAMIN D-3) 1,000 units tablet Take one tablet by mouth twice weekly.   clonazePAM (KLONOPIN) 1 mg tablet Take 1 tablet as needed for driving   cyanocobalamin (vitamin B-12) 2,500 mcg tab Take one tablet by mouth daily with breakfast.   diphenhydrAMINE HCL (BENADRYL) 12.5 mg/5 mL oral solution Take 5 mL by mouth every 6 hours as needed.   divalproex (DEPAKOTE EC) 250 mg DR tablet TAKE THREE TABLETS BY MOUTH DAILY. TAKE WITH FOOD.   ezetimibe (ZETIA) 10 mg tablet Take one tablet by mouth daily.   famotidine (PEPCID) 20 mg tablet Take one tablet by mouth daily as needed.   magnesium oxide (MAGOX) 400 mg (241.3 mg magnesium) tablet Take one tablet by mouth daily.   multivit, Ca, min-FA-soy isofl 400-60 mcg-mg tab Take 1 tablet by mouth twice daily.   mupirocin (CENTANY) 2 % topical ointment Apply  topically to affected area twice daily.   nebivoloL (BYSTOLIC) 5 mg tablet Take one tablet by mouth daily.   ondansetron (ZOFRAN ODT) 4 mg rapid dissolve tablet Dissolve two tablets by mouth every 8 hours as needed for Nausea or Vomiting. Place on tongue to dissolve.   pantoprazole DR (PROTONIX) 40 mg tablet TAKE 1 TABLET BY MOUTH EVERY DAY  Patient taking differently: Take one tablet by mouth as Needed.   Potassium 99 mg tab Take one tablet by mouth three times weekly.   prochlorperazine maleate (COMPAZINE) 10 mg tablet Take one tablet by mouth every 6 hours as needed for Nausea or Vomiting.   riboflavin (vitamin B2) 400 mg tab Take one tablet by mouth daily.   rosuvastatin (CRESTOR) 20 mg tablet Take one tablet by  mouth daily.   sertraline (ZOLOFT) 25 mg tablet Take one tablet by mouth daily. Please take with 50mg  tablets for total daily dose of 75mg    sertraline (ZOLOFT) 50 mg tablet TAKE ONE TABLET BY MOUTH DAILY. PLEASE TAKE WITH 25MG  TABLET FOR TOTAL DAILY DOSE OF 75MG    SUMAtriptan succinate (IMITREX) 100 mg tablet Take one tablet by mouth at onset of headache. May repeat after 2 hours if needed. Max of 200 mg in 24 hours.  Limit use to less than 10 tablets per month.   verapamil (CALAN) 40 mg tablet Take 1/2 tablet by mouth in the morning and 1 tablet in the evening   vitamins, multi w/minerals 9 mg iron-400 mcg tab Take one tablet by mouth daily with breakfast.         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), mild       No recent URI      Shortness of breath      Not on home oxygen      Cardiovascular         Exercise tolerance: >4 METS       Beta Blocker therapy: Yes        Hypertension (124/78), well controlled      No hx of coronary artery disease      No palpitations      No angina      Hyperlipidemia (statin)      No orthopnea      No dyspnea on exertion      GI/Hepatic/Renal           GERD (PPI), well controlled      No liver disease:        No renal disease:        No electrolyte problems      Nausea      History of pancreatitis      No hepatitis      Neuro/Psych       No seizures      No CVA      Headaches      No chronic opioid use      Chronic benzodiazepine use (clonazepam daily)      No indications/hx of sensory deficit        Psychiatric history          Depression          Anxiety      Musculoskeletal         Neck pain      Back pain      No arthritis:       Endocrine/Other - negative      No diabetes      No hypothyroidism      No anemia      No autoimmune disease      Obesity      Constitution - negative   Physical Exam    Airway Findings  Mallampati: II      TM distance: >3 FB      Neck ROM: full      Mouth opening: good      Airway patency: adequate    Dental Findings: Negative            Cardiovascular Findings: Negative        No murmur, no carotid bruit, no peripheral edema    Pulmonary Findings: Negative      Abdominal Findings: Negative      Obese    Constitutional findings: Negative       Diagnostic Tests  Hematology:   Lab Results   Component Value Date    HGB 13.8 10/10/2020    HCT 41.3 10/10/2020    PLTCT 277 10/10/2020    WBC 10.9 10/10/2020    NEUT 60 06/09/2019    ANC 7.23 06/09/2019    ALC 3.78 06/09/2019    MONA 7 06/09/2019    AMC 0.79 06/09/2019    EOSA 1 06/09/2019    ABC 0.07 06/09/2019    MCV 98.6 10/10/2020    MCH 32.9 10/10/2020    MCHC 33.3 10/10/2020    MPV 9.1 10/10/2020    RDW 12.6 10/10/2020         General Chemistry:   Lab Results   Component Value Date    NA 139 08/16/2021    K 4.4 08/16/2021    CL 108 08/16/2021    CO2 26.0 08/16/2021    GAP 5 08/16/2021    BUN 9.0 08/16/2021    CR 0.65 08/16/2021    GLU 91 08/16/2021    CA 8.5 08/16/2021    ALBUMIN 4.1 10/10/2020    TOTBILI 0.3 10/10/2020      Coagulation: No results found for: PT, PTT, INR      06/07/20 Cardiology pre-op eval.     06/21/20 Echo  The left ventricular size, wall thickness and systolic function are normal. The ejection fraction by Simpson's biplane method is 60%. Normal left ventricular diastolic function.      Cardiac Risk assessment, 06/21/20 Dr. Arna Medici:  Based upon the results of the patient's clinical profile and non-invasive cardiovascular testing, the risk for cardiovascular morbidity and mortality associated with non-cardiac surgery is INTERMEDIATE. This by no means precludes surgery but provides information for the surgeon, anesthesiologist and patient concerning the risks of surgery.  A decision to proceed with surgery should be made based upon the relative benefits and risks involved. Indeed, breast reduction and liposuction could be a very helpful intervention for this patient's overall health maintenance and could prove very helpful for improvement in her exercise tolerance, functional capacity and cardiovascular stability. There are no absolute contra-indications to elective surgery, as deemed necessary. No additional cardiovascular testing is required at this time. SBG      Anesthesia Plan    ASA score: 3           LAB: CBC, BMP

## 2022-02-04 ENCOUNTER — Encounter: Admit: 2022-02-04 | Discharge: 2022-02-04 | Payer: BC Managed Care – PPO

## 2022-02-04 DIAGNOSIS — M779 Enthesopathy, unspecified: Secondary | ICD-10-CM

## 2022-02-04 DIAGNOSIS — M720 Palmar fascial fibromatosis [Dupuytren]: Secondary | ICD-10-CM

## 2022-02-04 DIAGNOSIS — M24541 Contracture, right hand: Secondary | ICD-10-CM

## 2022-02-04 NOTE — Progress Notes
Patient scheduled for surgery on 6/22.    Post op therapy, per Dr. Felecia Shelling note on 01/29/2022, ordered and faxed to [facility] at [phone]

## 2022-02-05 ENCOUNTER — Ambulatory Visit: Admit: 2022-02-05 | Discharge: 2022-02-06 | Payer: BC Managed Care – PPO

## 2022-02-05 ENCOUNTER — Encounter: Admit: 2022-02-05 | Discharge: 2022-02-05 | Payer: BC Managed Care – PPO

## 2022-02-05 VITALS — BP 137/94 | HR 70 | Resp 16 | Ht 65.0 in | Wt 225.0 lb

## 2022-02-05 DIAGNOSIS — G5601 Carpal tunnel syndrome, right upper limb: Secondary | ICD-10-CM

## 2022-02-05 DIAGNOSIS — M1990 Unspecified osteoarthritis, unspecified site: Secondary | ICD-10-CM

## 2022-02-05 DIAGNOSIS — R052 Subacute cough: Secondary | ICD-10-CM

## 2022-02-05 DIAGNOSIS — K859 Acute pancreatitis without necrosis or infection, unspecified: Secondary | ICD-10-CM

## 2022-02-05 DIAGNOSIS — M549 Dorsalgia, unspecified: Secondary | ICD-10-CM

## 2022-02-05 DIAGNOSIS — T753XXA Motion sickness, initial encounter: Secondary | ICD-10-CM

## 2022-02-05 DIAGNOSIS — I10 Essential (primary) hypertension: Secondary | ICD-10-CM

## 2022-02-05 DIAGNOSIS — R42 Dizziness and giddiness: Secondary | ICD-10-CM

## 2022-02-05 DIAGNOSIS — R519 Generalized headaches: Secondary | ICD-10-CM

## 2022-02-05 DIAGNOSIS — E7800 Pure hypercholesterolemia, unspecified: Secondary | ICD-10-CM

## 2022-02-05 DIAGNOSIS — N3010 Interstitial cystitis (chronic) without hematuria: Secondary | ICD-10-CM

## 2022-02-05 DIAGNOSIS — F99 Mental disorder, not otherwise specified: Secondary | ICD-10-CM

## 2022-02-05 DIAGNOSIS — G44039 Episodic paroxysmal hemicrania, not intractable: Secondary | ICD-10-CM

## 2022-02-05 DIAGNOSIS — R0600 Dyspnea, unspecified: Secondary | ICD-10-CM

## 2022-02-05 DIAGNOSIS — G4733 Obstructive sleep apnea (adult) (pediatric): Secondary | ICD-10-CM

## 2022-02-05 DIAGNOSIS — G43909 Migraine, unspecified, not intractable, without status migrainosus: Secondary | ICD-10-CM

## 2022-02-05 DIAGNOSIS — J302 Other seasonal allergic rhinitis: Secondary | ICD-10-CM

## 2022-02-05 DIAGNOSIS — J449 Chronic obstructive pulmonary disease, unspecified: Secondary | ICD-10-CM

## 2022-02-05 DIAGNOSIS — Z720 Tobacco use: Secondary | ICD-10-CM

## 2022-02-05 MED ORDER — NICOTROL 10 MG IN CRTG
1 | RESPIRATORY_TRACT | 1 refills | Status: AC | PRN
Start: 2022-02-05 — End: ?

## 2022-02-05 NOTE — Patient Instructions
My nurse is Kendra Sewell, RN.  She can be reached at [phone].    Please contact my nurse with any signs and symptoms of worsening productive cough with thick secretions, blood in sputum, chest tightness/pain, shortness of breath, fever, chills, night sweats, or any questions or concerns.    For refills on medications, please have your pharmacy fax a refill authorization request form to our office at [phone]. Please allow at least 3 business days for refill requests.    For urgent issues after business hours/weekends/holidays call [phone] and request for the pulmonary fellow to be paged.

## 2022-02-06 DIAGNOSIS — Z87891 Personal history of nicotine dependence: Principal | ICD-10-CM

## 2022-02-07 ENCOUNTER — Encounter: Admit: 2022-02-07 | Discharge: 2022-02-07 | Payer: BC Managed Care – PPO

## 2022-02-07 DIAGNOSIS — G4733 Obstructive sleep apnea (adult) (pediatric): Secondary | ICD-10-CM

## 2022-02-07 DIAGNOSIS — G43909 Migraine, unspecified, not intractable, without status migrainosus: Secondary | ICD-10-CM

## 2022-02-07 DIAGNOSIS — R0600 Dyspnea, unspecified: Secondary | ICD-10-CM

## 2022-02-07 DIAGNOSIS — G5601 Carpal tunnel syndrome, right upper limb: Secondary | ICD-10-CM

## 2022-02-07 DIAGNOSIS — G44039 Episodic paroxysmal hemicrania, not intractable: Secondary | ICD-10-CM

## 2022-02-07 DIAGNOSIS — M549 Dorsalgia, unspecified: Secondary | ICD-10-CM

## 2022-02-07 DIAGNOSIS — M1990 Unspecified osteoarthritis, unspecified site: Secondary | ICD-10-CM

## 2022-02-07 DIAGNOSIS — K859 Acute pancreatitis without necrosis or infection, unspecified: Secondary | ICD-10-CM

## 2022-02-07 DIAGNOSIS — T753XXA Motion sickness, initial encounter: Secondary | ICD-10-CM

## 2022-02-07 DIAGNOSIS — E7800 Pure hypercholesterolemia, unspecified: Secondary | ICD-10-CM

## 2022-02-07 DIAGNOSIS — N3010 Interstitial cystitis (chronic) without hematuria: Secondary | ICD-10-CM

## 2022-02-07 DIAGNOSIS — R519 Generalized headaches: Secondary | ICD-10-CM

## 2022-02-07 DIAGNOSIS — J302 Other seasonal allergic rhinitis: Secondary | ICD-10-CM

## 2022-02-07 DIAGNOSIS — I10 Essential (primary) hypertension: Secondary | ICD-10-CM

## 2022-02-07 DIAGNOSIS — R42 Dizziness and giddiness: Secondary | ICD-10-CM

## 2022-02-07 DIAGNOSIS — F99 Mental disorder, not otherwise specified: Secondary | ICD-10-CM

## 2022-02-07 DIAGNOSIS — J449 Chronic obstructive pulmonary disease, unspecified: Secondary | ICD-10-CM

## 2022-02-07 DIAGNOSIS — Z720 Tobacco use: Secondary | ICD-10-CM

## 2022-02-07 NOTE — Telephone Encounter
Patient referred for low-dose lung screening CT scan.    Contacted patient to complete screening for high -risk criteria as follows:      Patient contacted: No, unable to reach LMTCB

## 2022-02-07 NOTE — Telephone Encounter
Called patient to inform her I would need to reschedule her appt as Dr Jackquline Berlin will be out for a funeral.   I assured her we would get her back in asap. I told her I would call her next Tuesday, as I am out on Monday. I asked her to call and confirm she got my message.

## 2022-02-11 ENCOUNTER — Encounter: Admit: 2022-02-11 | Discharge: 2022-02-11 | Payer: BC Managed Care – PPO

## 2022-02-14 ENCOUNTER — Encounter: Admit: 2022-02-14 | Discharge: 2022-02-14 | Payer: BC Managed Care – PPO

## 2022-02-20 ENCOUNTER — Ambulatory Visit: Admit: 2022-02-20 | Discharge: 2022-02-20 | Payer: BC Managed Care – PPO

## 2022-02-20 ENCOUNTER — Encounter: Admit: 2022-02-20 | Discharge: 2022-02-20 | Payer: BC Managed Care – PPO

## 2022-02-20 MED ORDER — DEXAMETHASONE SODIUM PHOSPHATE 10 MG/ML IJ SOLN
0 refills | Status: CP
Start: 2022-02-20 — End: ?
  Administered 2022-02-20: 15:00:00 4 mg

## 2022-02-20 MED ORDER — LIDOCAINE (PF) 10 MG/ML (1 %) IJ SOLN
SUBCUTANEOUS | 0 refills | Status: CP
Start: 2022-02-20 — End: ?
  Administered 2022-02-20: 15:00:00 2 mL via SUBCUTANEOUS

## 2022-02-20 MED ORDER — BUPIVACAINE HCL 0.5 % (5 MG/ML) IJ SOLN
0 refills | Status: CP
Start: 2022-02-20 — End: ?
  Administered 2022-02-20: 15:00:00 20 mL

## 2022-02-20 MED ORDER — MIDAZOLAM 1 MG/ML IJ SOLN
INTRAVENOUS | 0 refills | Status: CP
Start: 2022-02-20 — End: ?
  Administered 2022-02-20: 15:00:00 2 mg via INTRAVENOUS

## 2022-02-20 MED ORDER — CEFAZOLIN 1 GRAM IJ SOLR
INTRAVENOUS | 0 refills | Status: DC
Start: 2022-02-20 — End: 2022-02-20
  Administered 2022-02-20: 16:00:00 2 g via INTRAVENOUS

## 2022-02-20 MED ORDER — PROPOFOL 10 MG/ML IV EMUL 50 ML (INFUSION)(AM)(OR)
INTRAVENOUS | 0 refills | Status: DC
Start: 2022-02-20 — End: 2022-02-20
  Administered 2022-02-20: 15:00:00 100 ug/kg/min via INTRAVENOUS

## 2022-02-20 MED ORDER — FENTANYL CITRATE (PF) 50 MCG/ML IJ SOLN
INTRAVENOUS | 0 refills | Status: CP
Start: 2022-02-20 — End: ?
  Administered 2022-02-20: 15:00:00 100 ug via INTRAVENOUS

## 2022-02-20 MED ORDER — PROPOFOL INJ 10 MG/ML IV VIAL
INTRAVENOUS | 0 refills | Status: DC
Start: 2022-02-20 — End: 2022-02-20
  Administered 2022-02-20 (×2): 50 mg via INTRAVENOUS

## 2022-02-20 MED ORDER — LIDOCAINE (PF) 20 MG/ML (2 %) IJ SOLN
INTRAVENOUS | 0 refills | Status: DC
Start: 2022-02-20 — End: 2022-02-20
  Administered 2022-02-20: 15:00:00 100 mg via INTRAVENOUS

## 2022-02-20 MED ADMIN — ONDANSETRON HCL (PF) 4 MG/2 ML IJ SOLN [136012]: 4 mg | INTRAVENOUS | @ 17:00:00 | Stop: 2022-02-20 | NDC 72266012301

## 2022-02-20 MED ADMIN — LACTATED RINGERS IV SOLP [4318]: 1000.000 mL | INTRAVENOUS | @ 15:00:00 | Stop: 2022-02-20 | NDC 00338011704

## 2022-02-20 MED ADMIN — FENTANYL CITRATE (PF) 50 MCG/ML IJ SOLN [3037]: 50 ug | INTRAVENOUS | @ 16:00:00 | Stop: 2022-02-20 | NDC 00409909412

## 2022-02-20 MED ADMIN — ACETAMINOPHEN 325 MG PO TAB [101]: 650 mg | ORAL | @ 16:00:00 | Stop: 2022-02-20 | NDC 00904677361

## 2022-02-20 MED ADMIN — OXYCODONE 5 MG PO TAB [10814]: 10 mg | ORAL | @ 16:00:00 | Stop: 2022-02-20 | NDC 47781026305

## 2022-02-20 MED ADMIN — HYDROMORPHONE (PF) 2 MG/ML IJ SYRG [163476]: 0.5 mg | INTRAVENOUS | @ 18:00:00 | Stop: 2022-02-20 | NDC 00409131203

## 2022-02-20 MED ADMIN — HYDROMORPHONE (PF) 2 MG/ML IJ SYRG [163476]: 0.5 mg | INTRAVENOUS | @ 17:00:00 | Stop: 2022-02-20 | NDC 00409131203

## 2022-02-20 MED ADMIN — FENTANYL CITRATE (PF) 50 MCG/ML IJ SOLN [3037]: 50 ug | INTRAVENOUS | @ 17:00:00 | Stop: 2022-02-20 | NDC 00409909412

## 2022-03-04 ENCOUNTER — Encounter: Admit: 2022-03-04 | Discharge: 2022-03-04 | Payer: BC Managed Care – PPO

## 2022-03-05 ENCOUNTER — Encounter: Admit: 2022-03-05 | Discharge: 2022-03-05 | Payer: BC Managed Care – PPO

## 2022-03-05 ENCOUNTER — Ambulatory Visit: Admit: 2022-03-05 | Discharge: 2022-03-06 | Payer: BC Managed Care – PPO

## 2022-03-05 VITALS — BP 132/70 | HR 70 | Ht 65.0 in | Wt 230.0 lb

## 2022-03-05 DIAGNOSIS — T753XXA Motion sickness, initial encounter: Secondary | ICD-10-CM

## 2022-03-05 DIAGNOSIS — E7800 Pure hypercholesterolemia, unspecified: Secondary | ICD-10-CM

## 2022-03-05 DIAGNOSIS — M1990 Unspecified osteoarthritis, unspecified site: Secondary | ICD-10-CM

## 2022-03-05 DIAGNOSIS — G44039 Episodic paroxysmal hemicrania, not intractable: Secondary | ICD-10-CM

## 2022-03-05 DIAGNOSIS — K859 Acute pancreatitis without necrosis or infection, unspecified: Secondary | ICD-10-CM

## 2022-03-05 DIAGNOSIS — J449 Chronic obstructive pulmonary disease, unspecified: Secondary | ICD-10-CM

## 2022-03-05 DIAGNOSIS — Z87891 Personal history of nicotine dependence: Secondary | ICD-10-CM

## 2022-03-05 DIAGNOSIS — F99 Mental disorder, not otherwise specified: Secondary | ICD-10-CM

## 2022-03-05 DIAGNOSIS — N3010 Interstitial cystitis (chronic) without hematuria: Secondary | ICD-10-CM

## 2022-03-05 DIAGNOSIS — I10 Essential (primary) hypertension: Secondary | ICD-10-CM

## 2022-03-05 DIAGNOSIS — R519 Generalized headaches: Secondary | ICD-10-CM

## 2022-03-05 DIAGNOSIS — G5601 Carpal tunnel syndrome, right upper limb: Secondary | ICD-10-CM

## 2022-03-05 DIAGNOSIS — G4733 Obstructive sleep apnea (adult) (pediatric): Secondary | ICD-10-CM

## 2022-03-05 DIAGNOSIS — G43909 Migraine, unspecified, not intractable, without status migrainosus: Secondary | ICD-10-CM

## 2022-03-05 DIAGNOSIS — M549 Dorsalgia, unspecified: Secondary | ICD-10-CM

## 2022-03-05 DIAGNOSIS — R42 Dizziness and giddiness: Secondary | ICD-10-CM

## 2022-03-05 DIAGNOSIS — Z720 Tobacco use: Secondary | ICD-10-CM

## 2022-03-05 DIAGNOSIS — G5621 Lesion of ulnar nerve, right upper limb: Secondary | ICD-10-CM

## 2022-03-05 DIAGNOSIS — J302 Other seasonal allergic rhinitis: Secondary | ICD-10-CM

## 2022-03-05 DIAGNOSIS — R0600 Dyspnea, unspecified: Secondary | ICD-10-CM

## 2022-03-05 DIAGNOSIS — M24541 Contracture, right hand: Secondary | ICD-10-CM

## 2022-03-05 NOTE — Progress Notes
Subjective:       Here to follow-up.  Working aggressively in therapy.  Feels as though she has made significant strides.    Christina Garrison is a 51 y.o. female.       Review of Systems   Constitutional: Negative.    HENT: Negative.    Eyes: Negative.    Respiratory: Negative.    Cardiovascular: Negative.    Gastrointestinal: Negative.    Endocrine: Negative.    Genitourinary: Negative.    Musculoskeletal: Negative.    Skin: Negative.    Allergic/Immunologic: Negative.    Neurological: Negative.    Hematological: Negative.    Psychiatric/Behavioral: Negative.          Objective:         ? biotin 5,000 mcg TbDi Dissolve 2 tablets by mouth twice daily.   ? buPROPion HCL SR (WELLBUTRIN SR) 100 mg tablet, 12 hr sustained-release TAKE 1 TABLET BY MOUTH TWICE A DAY   ? cholecalciferol (VITAMIN D-3) 1,000 units tablet Take one tablet by mouth twice weekly.   ? clonazePAM (KLONOPIN) 1 mg tablet Take 1 tablet as needed for driving   ? cyanocobalamin (vitamin B-12) 2,500 mcg tab Take one tablet by mouth daily with breakfast.   ? diphenhydrAMINE HCL (BENADRYL) 12.5 mg/5 mL oral solution Take 5 mL by mouth every 6 hours as needed.   ? divalproex (DEPAKOTE EC) 250 mg DR tablet TAKE THREE TABLETS BY MOUTH DAILY. TAKE WITH FOOD.   ? ezetimibe (ZETIA) 10 mg tablet Take one tablet by mouth daily.   ? famotidine (PEPCID) 20 mg tablet Take one tablet by mouth daily as needed.   ? magnesium oxide (MAGOX) 400 mg (241.3 mg magnesium) tablet Take one tablet by mouth daily.   ? multivit, Ca, min-FA-soy isofl 400-60 mcg-mg tab Take 1 tablet by mouth twice daily.   ? mupirocin (CENTANY) 2 % topical ointment Apply  topically to affected area twice daily.   ? nebivoloL (BYSTOLIC) 5 mg tablet Take one tablet by mouth daily.   ? nicotine (NICOTROL) 10 mg inhaler Inhale one puff by mouth into the lungs as Needed. Puff by mouth as needed. May use 6-16 cartridges per day as needed for up to 6 months. Do not smoke while using inhaler.   ? ondansetron (ZOFRAN ODT) 4 mg rapid dissolve tablet Dissolve one tablet by mouth every 8 hours as needed for Nausea or Vomiting. Place on tongue to dissolve.   ? ondansetron (ZOFRAN ODT) 4 mg rapid dissolve tablet Dissolve two tablets by mouth every 8 hours as needed for Nausea or Vomiting. Place on tongue to dissolve.   ? pantoprazole DR (PROTONIX) 40 mg tablet TAKE 1 TABLET BY MOUTH EVERY DAY (Patient taking differently: Take one tablet by mouth as Needed.)   ? Potassium 99 mg tab Take one tablet by mouth three times weekly.   ? prochlorperazine maleate (COMPAZINE) 10 mg tablet Take one tablet by mouth every 6 hours as needed for Nausea or Vomiting.   ? riboflavin (vitamin B2) 400 mg tab Take one tablet by mouth daily.   ? rosuvastatin (CRESTOR) 20 mg tablet Take one tablet by mouth daily.   ? sertraline (ZOLOFT) 25 mg tablet Take one tablet by mouth daily. Please take with 50mg  tablets for total daily dose of 75mg    ? sertraline (ZOLOFT) 50 mg tablet TAKE ONE TABLET BY MOUTH DAILY. PLEASE TAKE WITH 25MG  TABLET FOR TOTAL DAILY DOSE OF 75MG    ? SUMAtriptan succinate (IMITREX)  100 mg tablet Take one tablet by mouth at onset of headache. May repeat after 2 hours if needed. Max of 200 mg in 24 hours.  Limit use to less than 10 tablets per month.   ? verapamil (CALAN) 40 mg tablet Take 1/2 tablet by mouth in the morning and 1 tablet in the evening   ? vitamins, multi w/minerals 9 mg iron-400 mcg tab Take one tablet by mouth daily with breakfast.     Vitals:    03/05/22 1439   BP: 132/70   Pulse: 70   PainSc: Four   Weight: 104.3 kg (230 lb)   Height: 165.1 cm (5' 5)     Body mass index is 38.27 kg/m?Marland Kitchen     Physical Exam  Right palm incision healing well, patient passively in essentially full extension       Assessment and Plan:  Status post right ring finger volar plate release at MCP  Overall making good strides in therapy, continue to be aggressive on range of motion with passive and active  Continue splinting for MCP extension especially at night  Follow-up in 2 weeks for suture removal

## 2022-03-05 NOTE — Telephone Encounter
Patient referred for low-dose lung screening CT scan.    Contacted patient to complete screening for high -risk criteria as follows:      Patient contacted: Yes   The patient is currently: 51 y.o.    Current Smoker?  No    Smoking history: 17yr x 2ppd    Pack years: 534yr   Quit smoking within the past 15 years if not currently smoking?  Yes     Years since quit: 1072yr  Do you have any concerning symptoms:  Asymptomatic    Personal history of lung cancer or other malignancy: No    Prior CT CHEST within the past year?   No    If yes, images and report requested:  na    High Risk Criteria:   Lung Screen met: Patient scheduled as follows:  APP Visit: 05/02/22  CT Lung Screening Scan: 05/02/22  Follow-up Results Phone Call: 05/02/22  Patient provided appointment information and verbalized understanding to all instructions.    Referring Provider: LauGerlene Fee [facility]

## 2022-03-06 DIAGNOSIS — M24541 Contracture, right hand: Principal | ICD-10-CM
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
## 2022-03-10 ENCOUNTER — Ambulatory Visit: Admit: 2022-03-10 | Discharge: 2022-03-11 | Payer: BC Managed Care – PPO

## 2022-03-10 ENCOUNTER — Encounter: Admit: 2022-03-10 | Discharge: 2022-03-10 | Payer: BC Managed Care – PPO

## 2022-03-10 DIAGNOSIS — F341 Dysthymic disorder: Secondary | ICD-10-CM

## 2022-03-10 DIAGNOSIS — F4310 Post-traumatic stress disorder, unspecified: Secondary | ICD-10-CM

## 2022-03-10 DIAGNOSIS — F4321 Adjustment disorder with depressed mood: Secondary | ICD-10-CM

## 2022-03-10 NOTE — Progress Notes
ATTENDING NOTE  I discussed Jerricka E Davis with Samella Parr, MD and concur with the assessment and treatment plan. Patient is 51 y.o. female with MDD, GAD and PTSD. Psychiatric symptoms well controlled at today's encounter. Denies SI/HI and AVH and no other safety concerns. Pt reports no medication side effects.    PLAN:  The following medication changes were made during this visit to better treat the above symptoms:  1. Continue Klonopin 1mg  PO Daily PRN  2. Continue Zoloft 75mg  PO Daily  3. Continue Depakote DR 750mg  PO QHS  4. Continue Wellbutrin SR 100mg  PO BID  5. Recommend Psychotherapy  6. No labs needed    ? biotin 5,000 mcg TbDi Dissolve 2 tablets by mouth twice daily.   ? buPROPion HCL SR (WELLBUTRIN SR) 100 mg tablet, 12 hr sustained-release TAKE 1 TABLET BY MOUTH TWICE A DAY   ? cholecalciferol (VITAMIN D-3) 1,000 units tablet Take one tablet by mouth twice weekly.   ? clonazePAM (KLONOPIN) 1 mg tablet Take 1 tablet as needed for driving   ? cyanocobalamin (vitamin B-12) 2,500 mcg tab Take one tablet by mouth daily with breakfast.   ? diphenhydrAMINE HCL (BENADRYL) 12.5 mg/5 mL oral solution Take 5 mL by mouth every 6 hours as needed.   ? divalproex (DEPAKOTE EC) 250 mg DR tablet TAKE THREE TABLETS BY MOUTH DAILY. TAKE WITH FOOD.   ? ezetimibe (ZETIA) 10 mg tablet Take one tablet by mouth daily.   ? famotidine (PEPCID) 20 mg tablet Take one tablet by mouth daily as needed.   ? magnesium oxide (MAGOX) 400 mg (241.3 mg magnesium) tablet Take one tablet by mouth daily.   ? multivit, Ca, min-FA-soy isofl 400-60 mcg-mg tab Take 1 tablet by mouth twice daily.   ? mupirocin (CENTANY) 2 % topical ointment Apply  topically to affected area twice daily.   ? nebivoloL (BYSTOLIC) 5 mg tablet Take one tablet by mouth daily.   ? nicotine (NICOTROL) 10 mg inhaler Inhale one puff by mouth into the lungs as Needed. Puff by mouth as needed. May use 6-16 cartridges per day as needed for up to 6 months. Do not smoke while using inhaler.   ? ondansetron (ZOFRAN ODT) 4 mg rapid dissolve tablet Dissolve one tablet by mouth every 8 hours as needed for Nausea or Vomiting. Place on tongue to dissolve.   ? ondansetron (ZOFRAN ODT) 4 mg rapid dissolve tablet Dissolve two tablets by mouth every 8 hours as needed for Nausea or Vomiting. Place on tongue to dissolve.   ? pantoprazole DR (PROTONIX) 40 mg tablet TAKE 1 TABLET BY MOUTH EVERY DAY (Patient taking differently: Take one tablet by mouth as Needed.)   ? Potassium 99 mg tab Take one tablet by mouth three times weekly.   ? prochlorperazine maleate (COMPAZINE) 10 mg tablet Take one tablet by mouth every 6 hours as needed for Nausea or Vomiting.   ? riboflavin (vitamin B2) 400 mg tab Take one tablet by mouth daily.   ? rosuvastatin (CRESTOR) 20 mg tablet Take one tablet by mouth daily.   ? sertraline (ZOLOFT) 25 mg tablet Take one tablet by mouth daily. Please take with 50mg  tablets for total daily dose of 75mg    ? sertraline (ZOLOFT) 50 mg tablet TAKE ONE TABLET BY MOUTH DAILY. PLEASE TAKE WITH 25MG  TABLET FOR TOTAL DAILY DOSE OF 75MG    ? SUMAtriptan succinate (IMITREX) 100 mg tablet Take one tablet by mouth at onset of headache. May repeat after 2  hours if needed. Max of 200 mg in 24 hours.  Limit use to less than 10 tablets per month.   ? verapamil (CALAN) 40 mg tablet Take 1/2 tablet by mouth in the morning and 1 tablet in the evening   ? vitamins, multi w/minerals 9 mg iron-400 mcg tab Take one tablet by mouth daily with breakfast.       Rae Mar, MD  03/10/2022

## 2022-03-10 NOTE — Patient Instructions
It was nice to see you in clinic today!    If you have any questions, you may reach the clinic by calling [phone] or, if you have MyChart access, can message me through your chart.    Brief Summary of today's visit:  I am very sorry to hear about your sister. Please accept my condolences. I will provide you some counseling resources below.  No changes for now. Let's work on helping you process your feelings over time.    We will see you back in about 3 months for follow up.    Please call the clinic at [phone] to reschedule or cancel appointments if a conflict with your schedule arises. For all appointments, please arrive 15 minutes prior to the start of your visit to facilitate rooming and other necessary check-in procedures. If you are not able to check-in on time, you may need to reschedule.    Please do not hesitate to call the clinic or contact us via MyChart if you have a change in your symptoms, if you have questions regarding your psychiatric medications, or if you start to develop side effects to medications prescribed by your psychiatrist. You can activate your MyChart account by visiting the website at [email or web address].     - Dr. Robb Matar    Our clinic is dedicated to making sure your prescriptions are filled in a timely manner during regular business hours (8am-5pm).  If you need a medication refill, please call your pharmacy to request a refill.  Please make sure to request refills at least 72 hours in advance of your last dose.  Your pharmacy will reach out to [place] electronically, which is the preferred method.  Please try to refrain from paging the on-call psychiatrist regarding medication refills (including controlled substances), as you may not receive a refill or a full refill at that time.    [facility] provides its patients with 24/7 care. If you are concerned about an impending psychiatric emergency (i.e. if you have any concerns about risk of harm to yourself, risk of harm to others or feel unsure about your ability to care for yourself), you may reach out to the overnight psychiatrist on-call by calling the main [facility] operator line outside of normal business hours ([phone]). If you are unsure if you are experiencing a psychiatric emergency, please ask the operator to page the on-call psychiatrist for clarification.    In the event of a safety concern or suicidal thoughts, call 911 or present to the nearest hospital emergency department. National Suicide Prevention Lifeline (917)508-7054 (TALK), or you may also dial 988. The Crisis Text Hotline (text (251)004-9827) is always available by SMS/Text. The [facility] phone line is a resource for talk support in non-emergency situations (1-866-WARMEAR).    If you have received a referral for therapy services through , please request an intake packet at the soonest convenience to facilitate the intake process.    How to Get Started    To inquire about the [facility] program and our services:  Call to initiate services at [phone]  Schedule an orientation  Be accepted into the program and placed into a support group and/or assigned to a therapist  Ask about additional resources to assist you and your family    [facility]   [email or web address]  Hospice in [place], [place]   Address: [address], [place], [place] [address]   Phone: [phone]    [email or web address]    [facility]  [address], [address]  [place], [place] [address]  We would love to hear from you. Feel free to call our office at [phone].    Office Hours:   8:30 a.m.-5 p.m. Monday-Thursday  8:30 a.m.-noon Fridays

## 2022-03-17 ENCOUNTER — Ambulatory Visit: Admit: 2022-03-17 | Discharge: 2022-03-18 | Payer: BC Managed Care – PPO

## 2022-03-17 ENCOUNTER — Encounter: Admit: 2022-03-17 | Discharge: 2022-03-17 | Payer: BC Managed Care – PPO

## 2022-03-17 DIAGNOSIS — Z720 Tobacco use: Secondary | ICD-10-CM

## 2022-03-17 DIAGNOSIS — M549 Dorsalgia, unspecified: Secondary | ICD-10-CM

## 2022-03-17 DIAGNOSIS — G43909 Migraine, unspecified, not intractable, without status migrainosus: Secondary | ICD-10-CM

## 2022-03-17 DIAGNOSIS — G4733 Obstructive sleep apnea (adult) (pediatric): Secondary | ICD-10-CM

## 2022-03-17 DIAGNOSIS — N3010 Interstitial cystitis (chronic) without hematuria: Secondary | ICD-10-CM

## 2022-03-17 DIAGNOSIS — R0600 Dyspnea, unspecified: Secondary | ICD-10-CM

## 2022-03-17 DIAGNOSIS — M24541 Contracture, right hand: Secondary | ICD-10-CM

## 2022-03-17 DIAGNOSIS — G5601 Carpal tunnel syndrome, right upper limb: Secondary | ICD-10-CM

## 2022-03-17 DIAGNOSIS — E7800 Pure hypercholesterolemia, unspecified: Secondary | ICD-10-CM

## 2022-03-17 DIAGNOSIS — G44039 Episodic paroxysmal hemicrania, not intractable: Secondary | ICD-10-CM

## 2022-03-17 DIAGNOSIS — K859 Acute pancreatitis without necrosis or infection, unspecified: Secondary | ICD-10-CM

## 2022-03-17 DIAGNOSIS — R42 Dizziness and giddiness: Secondary | ICD-10-CM

## 2022-03-17 DIAGNOSIS — M1990 Unspecified osteoarthritis, unspecified site: Secondary | ICD-10-CM

## 2022-03-17 DIAGNOSIS — F99 Mental disorder, not otherwise specified: Secondary | ICD-10-CM

## 2022-03-17 DIAGNOSIS — R519 Generalized headaches: Secondary | ICD-10-CM

## 2022-03-17 DIAGNOSIS — J302 Other seasonal allergic rhinitis: Secondary | ICD-10-CM

## 2022-03-17 DIAGNOSIS — T753XXA Motion sickness, initial encounter: Secondary | ICD-10-CM

## 2022-03-17 DIAGNOSIS — I10 Essential (primary) hypertension: Secondary | ICD-10-CM

## 2022-03-17 DIAGNOSIS — J449 Chronic obstructive pulmonary disease, unspecified: Secondary | ICD-10-CM

## 2022-03-17 NOTE — Progress Notes
Subjective:       Here to follow-up.  Making progress with therapy.  Feels as though things are coming along.    Christina Garrison is a 51 y.o. female.       Review of Systems   Constitutional: Negative.    HENT: Negative.    Eyes: Negative.    Respiratory: Negative.    Cardiovascular: Negative.    Gastrointestinal: Negative.    Endocrine: Negative.    Genitourinary: Negative.    Musculoskeletal: Negative.    Skin: Negative.    Allergic/Immunologic: Negative.    Neurological: Negative.    Hematological: Negative.    Psychiatric/Behavioral: Negative.          Objective:         ? biotin 5,000 mcg TbDi Dissolve 2 tablets by mouth twice daily.   ? buPROPion HCL SR (WELLBUTRIN SR) 100 mg tablet, 12 hr sustained-release TAKE 1 TABLET BY MOUTH TWICE A DAY   ? cholecalciferol (VITAMIN D-3) 1,000 units tablet Take one tablet by mouth twice weekly.   ? clonazePAM (KLONOPIN) 1 mg tablet Take 1 tablet as needed for driving   ? cyanocobalamin (vitamin B-12) 2,500 mcg tab Take one tablet by mouth daily with breakfast.   ? diphenhydrAMINE HCL (BENADRYL) 12.5 mg/5 mL oral solution Take 5 mL by mouth every 6 hours as needed.   ? divalproex (DEPAKOTE EC) 250 mg DR tablet TAKE THREE TABLETS BY MOUTH DAILY. TAKE WITH FOOD.   ? ezetimibe (ZETIA) 10 mg tablet Take one tablet by mouth daily.   ? famotidine (PEPCID) 20 mg tablet Take one tablet by mouth daily as needed.   ? multivit, Ca, min-FA-soy isofl 400-60 mcg-mg tab Take 1 tablet by mouth twice daily.   ? mupirocin (CENTANY) 2 % topical ointment Apply  topically to affected area twice daily.   ? nebivoloL (BYSTOLIC) 5 mg tablet Take one tablet by mouth daily.   ? nicotine (NICOTROL) 10 mg inhaler Inhale one puff by mouth into the lungs as Needed. Puff by mouth as needed. May use 6-16 cartridges per day as needed for up to 6 months. Do not smoke while using inhaler.   ? ondansetron (ZOFRAN ODT) 4 mg rapid dissolve tablet Dissolve one tablet by mouth every 8 hours as needed for Nausea or Vomiting. Place on tongue to dissolve.   ? ondansetron (ZOFRAN ODT) 4 mg rapid dissolve tablet Dissolve two tablets by mouth every 8 hours as needed for Nausea or Vomiting. Place on tongue to dissolve.   ? pantoprazole DR (PROTONIX) 40 mg tablet TAKE 1 TABLET BY MOUTH EVERY DAY (Patient taking differently: Take one tablet by mouth as Needed.)   ? Potassium 99 mg tab Take one tablet by mouth three times weekly.   ? prochlorperazine maleate (COMPAZINE) 10 mg tablet Take one tablet by mouth every 6 hours as needed for Nausea or Vomiting.   ? riboflavin (vitamin B2) 400 mg tab Take one tablet by mouth daily.   ? rosuvastatin (CRESTOR) 20 mg tablet Take one tablet by mouth daily.   ? sertraline (ZOLOFT) 25 mg tablet Take one tablet by mouth daily. Please take with 50mg  tablets for total daily dose of 75mg    ? sertraline (ZOLOFT) 50 mg tablet TAKE ONE TABLET BY MOUTH DAILY. PLEASE TAKE WITH 25MG  TABLET FOR TOTAL DAILY DOSE OF 75MG    ? SUMAtriptan succinate (IMITREX) 100 mg tablet Take one tablet by mouth at onset of headache. May repeat after 2 hours if needed.  Max of 200 mg in 24 hours.  Limit use to less than 10 tablets per month.   ? verapamil (CALAN) 40 mg tablet Take 1/2 tablet by mouth in the morning and 1 tablet in the evening   ? vitamins, multi w/minerals 9 mg iron-400 mcg tab Take one tablet by mouth daily with breakfast.     Vitals:    03/17/22 1531   PainSc: Five   Weight: 104.3 kg (230 lb)   Height: 165.1 cm (5' 5)     Body mass index is 38.27 kg/m?Marland Kitchen     Physical Exam  Palm incision healed well       Assessment and Plan:  Status post contracture release on hand    Sutures removed    Keep working in therapy, sent to therapy to address splint, follow-up with me in 3 to 4 weeks.

## 2022-03-27 ENCOUNTER — Encounter: Admit: 2022-03-27 | Discharge: 2022-03-27 | Payer: BC Managed Care – PPO

## 2022-03-27 DIAGNOSIS — F418 Other specified anxiety disorders: Secondary | ICD-10-CM

## 2022-03-27 DIAGNOSIS — F341 Dysthymic disorder: Secondary | ICD-10-CM

## 2022-03-27 DIAGNOSIS — F4310 Post-traumatic stress disorder, unspecified: Secondary | ICD-10-CM

## 2022-03-27 MED ORDER — DIVALPROEX 250 MG PO TBEC
750 mg | ORAL_TABLET | Freq: Every day | ORAL | 1 refills | 30.00000 days | Status: AC
Start: 2022-03-27 — End: ?

## 2022-04-18 ENCOUNTER — Encounter: Admit: 2022-04-18 | Discharge: 2022-04-18 | Payer: BC Managed Care – PPO

## 2022-04-23 ENCOUNTER — Encounter: Admit: 2022-04-23 | Discharge: 2022-04-23 | Payer: BC Managed Care – PPO

## 2022-04-23 ENCOUNTER — Ambulatory Visit: Admit: 2022-04-23 | Discharge: 2022-04-24 | Payer: BC Managed Care – PPO

## 2022-04-23 DIAGNOSIS — G5601 Carpal tunnel syndrome, right upper limb: Secondary | ICD-10-CM

## 2022-04-23 DIAGNOSIS — G43909 Migraine, unspecified, not intractable, without status migrainosus: Secondary | ICD-10-CM

## 2022-04-23 DIAGNOSIS — I10 Essential (primary) hypertension: Secondary | ICD-10-CM

## 2022-04-23 DIAGNOSIS — M1990 Unspecified osteoarthritis, unspecified site: Secondary | ICD-10-CM

## 2022-04-23 DIAGNOSIS — K859 Acute pancreatitis without necrosis or infection, unspecified: Secondary | ICD-10-CM

## 2022-04-23 DIAGNOSIS — Z720 Tobacco use: Secondary | ICD-10-CM

## 2022-04-23 DIAGNOSIS — G44039 Episodic paroxysmal hemicrania, not intractable: Secondary | ICD-10-CM

## 2022-04-23 DIAGNOSIS — G4733 Obstructive sleep apnea (adult) (pediatric): Secondary | ICD-10-CM

## 2022-04-23 DIAGNOSIS — R42 Dizziness and giddiness: Secondary | ICD-10-CM

## 2022-04-23 DIAGNOSIS — T753XXA Motion sickness, initial encounter: Secondary | ICD-10-CM

## 2022-04-23 DIAGNOSIS — F99 Mental disorder, not otherwise specified: Secondary | ICD-10-CM

## 2022-04-23 DIAGNOSIS — N3010 Interstitial cystitis (chronic) without hematuria: Secondary | ICD-10-CM

## 2022-04-23 DIAGNOSIS — J302 Other seasonal allergic rhinitis: Secondary | ICD-10-CM

## 2022-04-23 DIAGNOSIS — M779 Enthesopathy, unspecified: Secondary | ICD-10-CM

## 2022-04-23 DIAGNOSIS — M549 Dorsalgia, unspecified: Secondary | ICD-10-CM

## 2022-04-23 DIAGNOSIS — E7800 Pure hypercholesterolemia, unspecified: Secondary | ICD-10-CM

## 2022-04-23 DIAGNOSIS — R0600 Dyspnea, unspecified: Secondary | ICD-10-CM

## 2022-04-23 DIAGNOSIS — J449 Chronic obstructive pulmonary disease, unspecified: Secondary | ICD-10-CM

## 2022-04-23 DIAGNOSIS — R519 Generalized headaches: Secondary | ICD-10-CM

## 2022-04-23 NOTE — Progress Notes
Subjective:       Here to follow-up.  Feels as though she has lost ground on the ring finger again.  Has been working with therapy, but does not feel as though she is making much progress.    Christina Garrison is a 51 y.o. female.       Review of Systems   Constitutional: Negative.    HENT: Negative.    Eyes: Negative.    Respiratory: Negative.    Cardiovascular: Negative.    Gastrointestinal: Negative.    Endocrine: Negative.    Genitourinary: Negative.    Musculoskeletal: Negative.    Skin: Negative.    Allergic/Immunologic: Negative.    Neurological: Negative.    Hematological: Negative.    Psychiatric/Behavioral: Negative.          Objective:         ? biotin 5,000 mcg TbDi Dissolve 2 tablets by mouth twice daily.   ? buPROPion HCL SR (WELLBUTRIN SR) 100 mg tablet, 12 hr sustained-release TAKE 1 TABLET BY MOUTH TWICE A DAY   ? cholecalciferol (VITAMIN D-3) 1,000 units tablet Take one tablet by mouth twice weekly.   ? clonazePAM (KLONOPIN) 1 mg tablet Take 1 tablet as needed for driving   ? cyanocobalamin (vitamin B-12) 2,500 mcg tab Take one tablet by mouth daily with breakfast.   ? diphenhydrAMINE HCL (BENADRYL) 12.5 mg/5 mL oral solution Take 5 mL by mouth every 6 hours as needed.   ? divalproex (DEPAKOTE) 250 mg tablet,delayed release TAKE THREE TABLETS BY MOUTH DAILY. TAKE WITH FOOD.   ? ezetimibe (ZETIA) 10 mg tablet Take one tablet by mouth daily.   ? famotidine (PEPCID) 20 mg tablet Take one tablet by mouth daily as needed.   ? multivit, Ca, min-FA-soy isofl 400-60 mcg-mg tab Take 1 tablet by mouth twice daily.   ? mupirocin (CENTANY) 2 % topical ointment Apply  topically to affected area twice daily.   ? nebivoloL (BYSTOLIC) 5 mg tablet Take one tablet by mouth daily.   ? nicotine (NICOTROL) 10 mg inhaler Inhale one puff by mouth into the lungs as Needed. Puff by mouth as needed. May use 6-16 cartridges per day as needed for up to 6 months. Do not smoke while using inhaler.   ? ondansetron (ZOFRAN ODT) 4 mg rapid dissolve tablet Dissolve one tablet by mouth every 8 hours as needed for Nausea or Vomiting. Place on tongue to dissolve.   ? ondansetron (ZOFRAN ODT) 4 mg rapid dissolve tablet Dissolve two tablets by mouth every 8 hours as needed for Nausea or Vomiting. Place on tongue to dissolve.   ? pantoprazole DR (PROTONIX) 40 mg tablet TAKE 1 TABLET BY MOUTH EVERY DAY (Patient taking differently: Take one tablet by mouth as Needed.)   ? Potassium 99 mg tab Take one tablet by mouth three times weekly.   ? prochlorperazine maleate (COMPAZINE) 10 mg tablet Take one tablet by mouth every 6 hours as needed for Nausea or Vomiting.   ? riboflavin (vitamin B2) 400 mg tab Take one tablet by mouth daily.   ? rosuvastatin (CRESTOR) 20 mg tablet Take one tablet by mouth daily.   ? sertraline (ZOLOFT) 25 mg tablet Take one tablet by mouth daily. Please take with 50mg  tablets for total daily dose of 75mg    ? sertraline (ZOLOFT) 50 mg tablet TAKE ONE TABLET BY MOUTH DAILY. PLEASE TAKE WITH 25MG  TABLET FOR TOTAL DAILY DOSE OF 75MG    ? SUMAtriptan succinate (IMITREX) 100 mg tablet  Take one tablet by mouth at onset of headache. May repeat after 2 hours if needed. Max of 200 mg in 24 hours.  Limit use to less than 10 tablets per month.   ? verapamil (CALAN) 40 mg tablet Take 1/2 tablet by mouth in the morning and 1 tablet in the evening   ? vitamins, multi w/minerals 9 mg iron-400 mcg tab Take one tablet by mouth daily with breakfast.     Vitals:    04/23/22 1516   PainSc: Zero   Weight: 110.7 kg (244 lb)   Height: 165.1 cm (5' 5)     Body mass index is 40.6 kg/m?Marland Kitchen     Physical Exam  Right ring finger with about 25 degrees of flexion contracture at MCP joint, no passive extension beyond that       Assessment and Plan:  Recurrent right ring finger flexion contracture    I am unclear on why we are getting so much recurrent scar contracture of this MCP joint.  We have lost significant ground since her last visit.  I advised her to keep working on extension at night and a splint and active range of motion during the day.  I told her I would have her work on her own and save therapy visits for now.  I will see her back in 6 or 8 weeks.

## 2022-04-23 NOTE — Progress Notes
Hand Therapy Daily Note:    Name:Christina Garrison  DOB: November 13, 1970  MRN#: [id number]  Referring Physician:D Megee  Insurance: [employer]  Injury/Onset Date:            Surgical Date:    02/20/2022  Medical Diagnosis: contracture of finger joint, right; cubital tunnel, right  Right ring finger MCP joint capsulectomy and right ring finger flexor tenolysis  Treatment Diagnosis: same  Date of Initial Evaluation: 03/05/2022  Follow-up Physician Appointment:  Visit # 2    Subjective:    Pain: No significant complaints of pain during session    Objective:    Patient with MCP flexion contracture      Treatment:     Right:   Hand      Treatment Provided:    Scar Management: Massage, encouraged continued scar massage, provided massage ball      Orthosis:    Orthosis Adjusted:  Forearm-Based -  Extension Wrist/Fingers, to include ring and small only, and increased extension to tolerance    Purpose of Immobilization:To increase ROM    Orthosis Instructions:  Verbally instructed in orthosis/brace care and precautions. Verbally instructed in wearing schedule for orthosis/brace. At night only.    Assessment:    STG Goals:    Goal Number:  2  Goal: Patient to verbalize understanding of orthosis instructions including wearing schedule and precautions.  Goal Status:  Met      Patient's tolerance of treatment: Good    Plan:    Frequency of treatment:  Comments:will see for splint adjustments as needed.

## 2022-04-30 NOTE — Progress Notes
Telephone Visit  Counseling and Shared Decision Making Documentation for Screening for Lung Cancer with Low Dose Computed Tomography        Beneficiary eligibility criteria were verified to include:   Age 53-81 years old-51 y.o.  Symptoms- None   Social History     Tobacco Use   Smoking Status Former   ? Packs/day: 2.00   ? Years: 27.00   ? Additional pack years: 0.00   ? Total pack years: 54.00   ? Types: Cigarettes   ? Start date: 50   ? Quit date: 05/02/2012   ? Years since quitting: 10.0   Smokeless Tobacco Never   Tobacco Comments    Currently Vapes     11/02/20 CT chest- Unchanged small bilateral pulmonary nodules since at least February 2020, likely benign.     A discussion of the LDCT was provided to include:   Benefits and harm of screening   Possible follow-up diagnostic testing   Over-diagnosis   False positivity   Radiation exposure   Importance of adherence to annual lung cancer screening   Smoking cessation or continued abstinence     The patient meets criteria for a LDCT, questions were answered, and patient has agreed to proceed.      Medical History:   Diagnosis Date   ? Arthritis    ? Back pain    ? Carpal tunnel syndrome on right 12/27/2019   ? COPD (chronic obstructive pulmonary disease) (HCC)    ? Dyspnea     per pt-post op 2015.  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 Generalized headaches    ? High cholesterol    ? Hypertension 11/21/2010   ? Interstitial cystitis    ? Migraines    ? Motion sickness    ? OSA (obstructive sleep apnea)     uses NC w CA   ? Pancreatitis 11/21/2010   ? 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 Psychiatric illness     Bipolar; Anxiety; Depression   ? Seasonal allergic reaction    ? Tobacco abuse    ? Vertigo          Plan:  Results Reviewed and Discussed with Patient via telephone on 05/02/22 at  1600:  IMPRESSION   1. Mild paraseptal emphysema with scattered sub-5 mm pulmonary nodules. Continue yearly screening according to protocol (Lung-Rads 2)       [ ] There are no findings which require close attention. Continue yearly   screening according to protocol. (Lung-Rads 1)   [ ]  There are non-urgent findings which are likely to be clinically   significant, as described in impression number(s) . (Lung-Rads S)   [ ]  There are urgent findings which require attention as described in   impression number(s) . (Lung-Rads S)   [x]  Lung nodule(s) present with follow-up low dose ct recommendations as   described in impression number(s) 1. (Lung-Rads 2).   [ ]  Lung nodule(s) or mass(es) with diagnostic recommendations as   described in impression number(s) . (Lung-Rads )       ?Finalized by Katina Dung, M.D. on 05/02/2022 2:24 PM. Dictated by   Katina Dung, M.D. on 05/02/2022 2:18 PM.      We will plan to follow up with the patient in 12 months' time with a LDCT scan of the chest.       PCP- Dr. Tonye Pearson  Referring Provider: [facility]  Micheal Likens, APRN  Lung Cancer Screening and Thoracic Surgery   [phone]    10 minutes spent in shared decision making, consent, CT scan review and results, recommendation(s), coordination and plan of care in the future.

## 2022-05-02 ENCOUNTER — Encounter: Admit: 2022-05-02 | Discharge: 2022-05-02 | Payer: BC Managed Care – PPO

## 2022-05-02 ENCOUNTER — Ambulatory Visit: Admit: 2022-05-02 | Discharge: 2022-05-02 | Payer: BC Managed Care – PPO

## 2022-05-02 DIAGNOSIS — Z87891 Personal history of nicotine dependence: Secondary | ICD-10-CM

## 2022-05-02 DIAGNOSIS — Z122 Encounter for screening for malignant neoplasm of respiratory organs: Secondary | ICD-10-CM

## 2022-05-16 ENCOUNTER — Encounter: Admit: 2022-05-16 | Discharge: 2022-05-16 | Payer: BC Managed Care – PPO

## 2022-05-16 MED ORDER — CLONAZEPAM 0.5 MG PO TAB
ORAL_TABLET | 0 refills
Start: 2022-05-16 — End: ?

## 2022-05-20 ENCOUNTER — Encounter: Admit: 2022-05-20 | Discharge: 2022-05-20 | Payer: BC Managed Care – PPO

## 2022-05-20 MED ORDER — CLONAZEPAM 1 MG PO TAB
ORAL_TABLET | 2 refills | Status: DC
Start: 2022-05-20 — End: 2022-05-20

## 2022-06-04 ENCOUNTER — Encounter: Admit: 2022-06-04 | Discharge: 2022-06-04 | Payer: BC Managed Care – PPO

## 2022-06-04 ENCOUNTER — Ambulatory Visit: Admit: 2022-06-04 | Discharge: 2022-06-04 | Payer: BC Managed Care – PPO

## 2022-06-04 DIAGNOSIS — G5601 Carpal tunnel syndrome, right upper limb: Secondary | ICD-10-CM

## 2022-06-04 DIAGNOSIS — G5621 Lesion of ulnar nerve, right upper limb: Secondary | ICD-10-CM

## 2022-06-04 DIAGNOSIS — R0600 Dyspnea, unspecified: Secondary | ICD-10-CM

## 2022-06-04 DIAGNOSIS — M549 Dorsalgia, unspecified: Secondary | ICD-10-CM

## 2022-06-04 DIAGNOSIS — G44039 Episodic paroxysmal hemicrania, not intractable: Secondary | ICD-10-CM

## 2022-06-04 DIAGNOSIS — R519 Generalized headaches: Secondary | ICD-10-CM

## 2022-06-04 DIAGNOSIS — N3010 Interstitial cystitis (chronic) without hematuria: Secondary | ICD-10-CM

## 2022-06-04 DIAGNOSIS — M1990 Unspecified osteoarthritis, unspecified site: Secondary | ICD-10-CM

## 2022-06-04 DIAGNOSIS — J449 Chronic obstructive pulmonary disease, unspecified: Secondary | ICD-10-CM

## 2022-06-04 DIAGNOSIS — T753XXA Motion sickness, initial encounter: Secondary | ICD-10-CM

## 2022-06-04 DIAGNOSIS — F99 Mental disorder, not otherwise specified: Secondary | ICD-10-CM

## 2022-06-04 DIAGNOSIS — J302 Other seasonal allergic rhinitis: Secondary | ICD-10-CM

## 2022-06-04 DIAGNOSIS — I10 Essential (primary) hypertension: Secondary | ICD-10-CM

## 2022-06-04 DIAGNOSIS — G4733 Obstructive sleep apnea (adult) (pediatric): Secondary | ICD-10-CM

## 2022-06-04 DIAGNOSIS — M24541 Contracture, right hand: Secondary | ICD-10-CM

## 2022-06-04 DIAGNOSIS — G43909 Migraine, unspecified, not intractable, without status migrainosus: Secondary | ICD-10-CM

## 2022-06-04 DIAGNOSIS — K859 Acute pancreatitis without necrosis or infection, unspecified: Secondary | ICD-10-CM

## 2022-06-04 DIAGNOSIS — Z720 Tobacco use: Secondary | ICD-10-CM

## 2022-06-04 DIAGNOSIS — R42 Dizziness and giddiness: Secondary | ICD-10-CM

## 2022-06-04 DIAGNOSIS — E7800 Pure hypercholesterolemia, unspecified: Secondary | ICD-10-CM

## 2022-06-04 NOTE — Progress Notes
Date of Service: 06/04/2022    Subjective:             Christina Garrison is a 51 y.o. female.    History of Present Illness  Having increasing numbness/tingling in right ulnar nerve distribution again.     Review of Systems   All other systems reviewed and are negative.        Objective:         ? biotin 5,000 mcg TbDi Dissolve 2 tablets by mouth twice daily.   ? buPROPion HCL SR (WELLBUTRIN SR) 100 mg tablet, 12 hr sustained-release TAKE 1 TABLET BY MOUTH TWICE A DAY   ? cholecalciferol (VITAMIN D-3) 1,000 units tablet Take one tablet by mouth twice weekly.   ? clonazePAM (KLONOPIN) 0.5 mg tablet TAKE 1.5 TABLETS BY MOUTH TWICE A DAY FOR 60 DAYS   ? cyanocobalamin (vitamin B-12) 2,500 mcg tab Take one tablet by mouth daily with breakfast.   ? diphenhydrAMINE HCL (BENADRYL) 12.5 mg/5 mL oral solution Take 5 mL by mouth every 6 hours as needed.   ? divalproex (DEPAKOTE) 250 mg tablet,delayed release TAKE THREE TABLETS BY MOUTH DAILY. TAKE WITH FOOD.   ? ezetimibe (ZETIA) 10 mg tablet Take one tablet by mouth daily.   ? famotidine (PEPCID) 20 mg tablet Take one tablet by mouth daily as needed.   ? multivit, Ca, min-FA-soy isofl 400-60 mcg-mg tab Take 1 tablet by mouth twice daily.   ? mupirocin (CENTANY) 2 % topical ointment Apply  topically to affected area twice daily.   ? nebivoloL (BYSTOLIC) 5 mg tablet Take one tablet by mouth daily.   ? nicotine (NICOTROL) 10 mg inhaler Inhale one puff by mouth into the lungs as Needed. Puff by mouth as needed. May use 6-16 cartridges per day as needed for up to 6 months. Do not smoke while using inhaler.   ? ondansetron (ZOFRAN ODT) 4 mg rapid dissolve tablet Dissolve one tablet by mouth every 8 hours as needed for Nausea or Vomiting. Place on tongue to dissolve.   ? ondansetron (ZOFRAN ODT) 4 mg rapid dissolve tablet Dissolve two tablets by mouth every 8 hours as needed for Nausea or Vomiting. Place on tongue to dissolve.   ? pantoprazole DR (PROTONIX) 40 mg tablet TAKE 1 TABLET BY MOUTH EVERY DAY (Patient taking differently: Take one tablet by mouth as Needed.)   ? Potassium 99 mg tab Take one tablet by mouth three times weekly.   ? prochlorperazine maleate (COMPAZINE) 10 mg tablet Take one tablet by mouth every 6 hours as needed for Nausea or Vomiting.   ? riboflavin (vitamin B2) 400 mg tab Take one tablet by mouth daily.   ? rosuvastatin (CRESTOR) 20 mg tablet Take one tablet by mouth daily.   ? sertraline (ZOLOFT) 25 mg tablet Take one tablet by mouth daily. Please take with 50mg  tablets for total daily dose of 75mg    ? sertraline (ZOLOFT) 50 mg tablet TAKE ONE TABLET BY MOUTH DAILY. PLEASE TAKE WITH 25MG  TABLET FOR TOTAL DAILY DOSE OF 75MG    ? SUMAtriptan succinate (IMITREX) 100 mg tablet Take one tablet by mouth at onset of headache. May repeat after 2 hours if needed. Max of 200 mg in 24 hours.  Limit use to less than 10 tablets per month.   ? verapamil (CALAN) 40 mg tablet Take 1/2 tablet by mouth in the morning and 1 tablet in the evening   ? vitamins, multi w/minerals 9 mg iron-400 mcg tab  Take one tablet by mouth daily with breakfast.     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  Follow-up after EMG.

## 2022-06-04 NOTE — Progress Notes
Patient sen for a brief visit this date;  Replaced straps for a better hold but not able to adjust splint for increased extension continues to be at end range at this time.  Patient to call if she feels she is able to extend more and needs an adjustment.

## 2022-06-06 ENCOUNTER — Encounter: Admit: 2022-06-06 | Discharge: 2022-06-06 | Payer: BC Managed Care – PPO

## 2022-06-08 NOTE — Patient Instructions
--   Preferred method of communication is through [employer], if the issue cannot wait until your next scheduled follow up.   -- MyChart may be used for non-emergent communication. Emails are not reviewed after hours or over the weekend/holidays/after 4PM. Staff will reply to your email within 24-48 business hours.     -- If you do not hear from [place] within one week of a lab or imaging study being completed, please call/send my chart email to the office to be sure that we have received the results. This is especially challenging when tests are done outside of the [facility] system, as many times results do not make it back to our office for a variety of reasons. In our office no news is good news does not apply. You should hear from [place] with results for each test.  [facility] lab/imaging results:  Due to the CARES act, results automatically release to MyChart.  Dr. Elita Quick will continue to send you a result note on any labs that she orders.  With these changes you may see your results before Dr. Elita Quick does. Please allow up to 72 hours for review and response to your results.     -- If you are having acute (new/sudden onset) or severe/worsening neurologic symptoms, please call 911 or seek care in ED.    -- For scheduling of IMAGING/RADIOLOGY, please call [phone] at your convenience to schedule your studies.  -- For referrals placed during the visit, if you have not heard from scheduling within one week, please call the call center at [phone] to get scheduling assistance.  -- For refills on medications, please first contact your pharmacy, who will fax a refill authorization request form to our office.  Weekdays only. Allow up to 2 business days for refills. Please plan ahead, as refills will not be filled after hours.  -- Our scheduling staff, may be reached at [phone] for scheduling needs.   -- Your nurse Delice Bison, may be contacted at [phone] for urgent needs. Staff will return your call within 24 business hours.     For Appointments:   -- Please try to arrive early for your appointment time to help facilitate your visit. 15 minutes early is recommended.   -- If you are late to your appointment, we reserve the right to ask you to reschedule or wait until next available time to be seen in fairness to other patients scheduled that day.   -- There are times when we are running behind in clinic. Our goal is to always be on time, however, there are time when unexpected events occur with patients, which may cause a delay. We appreciate your understanding when this occurs.

## 2022-06-13 ENCOUNTER — Encounter: Admit: 2022-06-13 | Discharge: 2022-06-13 | Payer: BC Managed Care – PPO

## 2022-06-13 ENCOUNTER — Ambulatory Visit: Admit: 2022-06-13 | Discharge: 2022-06-13 | Payer: BC Managed Care – PPO

## 2022-06-13 ENCOUNTER — Ambulatory Visit: Admit: 2022-06-13 | Discharge: 2022-06-14 | Payer: BC Managed Care – PPO

## 2022-06-13 DIAGNOSIS — M549 Dorsalgia, unspecified: Secondary | ICD-10-CM

## 2022-06-13 DIAGNOSIS — G43009 Migraine without aura, not intractable, without status migrainosus: Secondary | ICD-10-CM

## 2022-06-13 DIAGNOSIS — R0600 Dyspnea, unspecified: Secondary | ICD-10-CM

## 2022-06-13 DIAGNOSIS — G43909 Migraine, unspecified, not intractable, without status migrainosus: Secondary | ICD-10-CM

## 2022-06-13 DIAGNOSIS — F99 Mental disorder, not otherwise specified: Secondary | ICD-10-CM

## 2022-06-13 DIAGNOSIS — J302 Other seasonal allergic rhinitis: Secondary | ICD-10-CM

## 2022-06-13 DIAGNOSIS — E7800 Pure hypercholesterolemia, unspecified: Secondary | ICD-10-CM

## 2022-06-13 DIAGNOSIS — G4733 Obstructive sleep apnea (adult) (pediatric): Secondary | ICD-10-CM

## 2022-06-13 DIAGNOSIS — T753XXA Motion sickness, initial encounter: Secondary | ICD-10-CM

## 2022-06-13 DIAGNOSIS — G44039 Episodic paroxysmal hemicrania, not intractable: Secondary | ICD-10-CM

## 2022-06-13 DIAGNOSIS — G5601 Carpal tunnel syndrome, right upper limb: Secondary | ICD-10-CM

## 2022-06-13 DIAGNOSIS — R519 Generalized headaches: Secondary | ICD-10-CM

## 2022-06-13 DIAGNOSIS — M1990 Unspecified osteoarthritis, unspecified site: Secondary | ICD-10-CM

## 2022-06-13 DIAGNOSIS — N3010 Interstitial cystitis (chronic) without hematuria: Secondary | ICD-10-CM

## 2022-06-13 DIAGNOSIS — J449 Chronic obstructive pulmonary disease, unspecified: Secondary | ICD-10-CM

## 2022-06-13 DIAGNOSIS — I10 Essential (primary) hypertension: Secondary | ICD-10-CM

## 2022-06-13 DIAGNOSIS — Z720 Tobacco use: Secondary | ICD-10-CM

## 2022-06-13 DIAGNOSIS — R42 Dizziness and giddiness: Secondary | ICD-10-CM

## 2022-06-13 DIAGNOSIS — K859 Acute pancreatitis without necrosis or infection, unspecified: Secondary | ICD-10-CM

## 2022-06-13 MED ORDER — CLONAZEPAM 0.5 MG PO TAB
.5 mg | ORAL_TABLET | Freq: Every evening | ORAL | 5 refills | Status: AC
Start: 2022-06-13 — End: ?

## 2022-06-13 MED ORDER — CLONAZEPAM 1 MG PO TAB
1 mg | ORAL_TABLET | Freq: Two times a day (BID) | ORAL | 5 refills | Status: AC
Start: 2022-06-13 — End: ?

## 2022-06-13 NOTE — Progress Notes
Date of Service: 06/13/2022    Subjective:             Christina Garrison is a 51 y.o. female.    History of Present Illness  Last visit on 11/08/2021.    She reports increased headache in the past 1.5 months. In this period, her sister has passed away and she had a new R elbow fracture. She has been having her typical migraine headaches x1/wk but most episodes are usually aborted with a cocktail of benadryl, sumatriptan and compazine.   Triggers for migraine that she has not been avoiding is sunlight.   Few factors that could be worsening her migraine is : inaccurate eye prescription (planning to see eye doctor soon), inconsistently using BiPaP (has a f/up appt with Pulm doctor in Nov).        Review of Systems   Constitutional: Positive for diaphoresis.   HENT: Positive for sinus pressure.    Eyes: Positive for photophobia.   Respiratory: Positive for apnea.    Endocrine: Positive for cold intolerance and heat intolerance.   Musculoskeletal: Positive for neck stiffness.   Allergic/Immunologic: Positive for environmental allergies.   Neurological: Positive for dizziness and headaches.   Psychiatric/Behavioral: The patient is nervous/anxious.    All other systems reviewed and are negative.        Objective:         ? biotin 5,000 mcg TbDi Dissolve 2 tablets by mouth twice daily.   ? buPROPion HCL SR (WELLBUTRIN SR) 100 mg tablet, 12 hr sustained-release TAKE 1 TABLET BY MOUTH TWICE A DAY   ? cholecalciferol (VITAMIN D-3) 1,000 units tablet Take one tablet by mouth twice weekly.   ? clonazePAM (KLONOPIN) 0.5 mg tablet TAKE 1.5 TABLETS BY MOUTH TWICE A DAY FOR 60 DAYS   ? cyanocobalamin (vitamin B-12) 2,500 mcg tab Take one tablet by mouth daily with breakfast.   ? diphenhydrAMINE HCL (BENADRYL) 12.5 mg/5 mL oral solution Take 5 mL by mouth every 6 hours as needed.   ? divalproex (DEPAKOTE) 250 mg tablet,delayed release TAKE THREE TABLETS BY MOUTH DAILY. TAKE WITH FOOD.   ? ezetimibe (ZETIA) 10 mg tablet Take one tablet by mouth daily.   ? famotidine (PEPCID) 20 mg tablet Take one tablet by mouth daily as needed.   ? multivit, Ca, min-FA-soy isofl 400-60 mcg-mg tab Take 1 tablet by mouth twice daily.   ? mupirocin (CENTANY) 2 % topical ointment Apply  topically to affected area twice daily.   ? nebivoloL (BYSTOLIC) 5 mg tablet Take one tablet by mouth daily.   ? nicotine (NICOTROL) 10 mg inhaler Inhale one puff by mouth into the lungs as Needed. Puff by mouth as needed. May use 6-16 cartridges per day as needed for up to 6 months. Do not smoke while using inhaler.   ? ondansetron (ZOFRAN ODT) 4 mg rapid dissolve tablet Dissolve one tablet by mouth every 8 hours as needed for Nausea or Vomiting. Place on tongue to dissolve.   ? ondansetron (ZOFRAN ODT) 4 mg rapid dissolve tablet Dissolve two tablets by mouth every 8 hours as needed for Nausea or Vomiting. Place on tongue to dissolve.   ? pantoprazole DR (PROTONIX) 40 mg tablet TAKE 1 TABLET BY MOUTH EVERY DAY (Patient taking differently: Take one tablet by mouth as Needed.)   ? Potassium 99 mg tab Take one tablet by mouth three times weekly.   ? prochlorperazine maleate (COMPAZINE) 10 mg tablet Take one tablet by mouth every  6 hours as needed for Nausea or Vomiting.   ? riboflavin (vitamin B2) 400 mg tab Take one tablet by mouth daily.   ? rosuvastatin (CRESTOR) 20 mg tablet Take one tablet by mouth daily.   ? sertraline (ZOLOFT) 25 mg tablet Take one tablet by mouth daily. Please take with 50mg  tablets for total daily dose of 75mg    ? sertraline (ZOLOFT) 50 mg tablet TAKE ONE TABLET BY MOUTH DAILY. PLEASE TAKE WITH 25MG  TABLET FOR TOTAL DAILY DOSE OF 75MG    ? SUMAtriptan succinate (IMITREX) 100 mg tablet Take one tablet by mouth at onset of headache. May repeat after 2 hours if needed. Max of 200 mg in 24 hours.  Limit use to less than 10 tablets per month.   ? verapamil (CALAN) 40 mg tablet Take 1/2 tablet by mouth in the morning and 1 tablet in the evening   ? vitamins, multi w/minerals 9 mg iron-400 mcg tab Take one tablet by mouth daily with breakfast.     Vitals:    06/13/22 1122   PainSc: Six   Weight: 106.6 kg (235 lb)   Height: 165.1 cm (5' 5)     Body mass index is 39.11 kg/m?Marland Kitchen     Physical Exam    Neuro exam:   Mental status: alert, oriented to person/place/month/year  ?  Speech:   ? Normal Abnormal   Fluency x ?   Comprehension x ?   Articulation x ?   ?  ?    Cranial Nerves:   ? Normal Abnormal   II PERRLA, visual fields intact bilaterally.sharp disc margins ?   III, IV, VI EOMI w/o nystagmus ?   V Intact to light touch in V1-V3 bilaterally ?   VII No facial asymmetry, orbicularis oculi and oris strength 5/5 ?   VIII Gross hearing intact to conversation ?   IX, X Symmetric palatal elevation ?   XI Shoulder shrug 5/5 bilaterally ?   XII Tongue protrudes in midline ?   ?    Muscle/motor:   ?  Grade 0,1,2,3,4,5)  Neck flexors ? ?   Neck extensors ? ?   ? RIGHT LEFT   Shoulder abductors 5 5   Elbow flexors 5 5   Elbow extensors 5 5   Wrist extensors 5 5   Wrist flexors ? ?   Finger flexors 5 5   Finger extensors ? ?   Finger abductors ? ?   Thumb abductors ? ?   Hip flexors 5 5   Hip abductors ? ?   Hip extensors 5 5   Hip adductors ? ?   Knee flexors 5 5   Knee extensors 5 5   Ankle dorsiflexors 5 5   Ankle plantar flexors 5 5   Ankle inversion ? ?   Ankle eversion ? ?   Toe flexors ? ?   Toe extensors ? ?   ?  ?    Sensation:   ? Normal RUE LUE RLE LLE   Light Touch x ? ? ? ?   ?    Coordination:   ? Normal Abnormal Right Abnormal Left   Finger to Nose x ? ?   Rapid alternating  x ? ?   Heel to Shin x ? ?   Finger tap x ? ?   Foot tap x ? ?     Gait and Station:  Normal gait with good arm swing  Assessment and Plan:  Christina Garrison?is a 51 y.o.?female?with a PMH significant for HTN, HLD, vertigo on clonazepam, emphysema, obesity s/p gastric sleeve, OSA on BiPAP, anxiety, PTSD, occipital neuralgia, tension headaches, and migraine, who presents for follow up of her headaches.  ?  Chronic Migraine with Aura  Occipital Neuralgia  - Current frequency: 2-3 severe migraines per month  Typical migraine-  Holoacranial, throbbing, associated with photo and phonophobia and nausea, lasting a few hours. Usually controlled with sumatriptan 100mg , benadryl and compazine cocktail.   Current frequency-  Increased in the past 1.88months to x1/week. In the past occurred x1-2/month.  Stress in past month (sister passed away and fractures R elbow)  Trigger-sunlight  Has been waking up with headaches in the past few months  - She has previously tried topiramate and had intolerable side effects. ?She is not an ideal candidate for a beta blocker due to her relative hypotension in clinic today and her history of emphysema. ?She has had an adverse reaction to venlafaxine in the past.  - couldn't tolerate tricyclic due to sedation  - overall migraines have been stable, pt not wanting to try something new at this time    Plan:  ?  > Preventive:  ?????????????Continue Depakote 750 mg DR (has been on it for at least 8 years) -> Recommended f/up with Psychiatry to assess if there is a psychiatric indication for Depakote  ?????????????Continue magnesium 400 mg and riboflavin 400 mg daily    Will consider a CGRP antagonist during next visit if breakthrough headaches continue to occur >x1/wk  > Abortive therapy: continue sumatriptan 100mg  PRN along with benadryl and compazine  > Contributing factors to headache:         Recommend f/up with pulmonary for more consistent BiPaP use or nightly O2 use         Recommend f/up with eye doctor to assess for need of new prescription   ?  Vertigo  Her symptoms continue to be well-managed on clonazepam. 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  Plan:  > Continue clonazepam 0.75 mg BID and 1 mg daily PRN  ?  Blurred Vision  - had glasses recently changed, to see eye doctor again soon  - Not resolved with eye drops  - fundoscopic exam w/ sharp margins, no other signs concerning for IIH  - will continue to monitor  ?  Pt seen and discussed with Dr. Genevie Cheshire.  ?    ?  Almyra Deforest, MBBS  Neurology PGY4 Resident

## 2022-06-17 ENCOUNTER — Encounter: Admit: 2022-06-17 | Discharge: 2022-06-17 | Payer: BC Managed Care – PPO

## 2022-06-17 ENCOUNTER — Ambulatory Visit: Admit: 2022-06-17 | Discharge: 2022-06-18 | Payer: BC Managed Care – PPO

## 2022-06-17 VITALS — BP 121/81 | HR 69 | Ht 65.0 in | Wt 235.0 lb

## 2022-06-17 DIAGNOSIS — G43009 Migraine without aura, not intractable, without status migrainosus: Secondary | ICD-10-CM

## 2022-06-17 DIAGNOSIS — G4733 Obstructive sleep apnea (adult) (pediatric): Secondary | ICD-10-CM

## 2022-06-17 MED ORDER — SERTRALINE 25 MG PO TAB
25 mg | ORAL_TABLET | Freq: Every day | ORAL | 2 refills | Status: AC
Start: 2022-06-17 — End: ?

## 2022-06-17 MED ORDER — BUPROPION SR 100 MG PO TBSR
100 mg | Freq: Two times a day (BID) | ORAL | 1 refills | Status: AC
Start: 2022-06-17 — End: ?

## 2022-06-17 MED ORDER — SERTRALINE 50 MG PO TAB
50 mg | ORAL_TABLET | Freq: Every day | ORAL | 2 refills | Status: AC
Start: 2022-06-17 — End: ?

## 2022-06-17 MED ORDER — DIVALPROEX 250 MG PO TBEC
250 mg | ORAL_TABLET | Freq: Two times a day (BID) | ORAL | 2 refills | 30.00000 days | Status: AC
Start: 2022-06-17 — End: ?
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
## 2022-06-18 DIAGNOSIS — F418 Other specified anxiety disorders: Secondary | ICD-10-CM

## 2022-06-18 DIAGNOSIS — F4310 Post-traumatic stress disorder, unspecified: Principal | ICD-10-CM

## 2022-06-18 DIAGNOSIS — F341 Dysthymic disorder: Secondary | ICD-10-CM

## 2022-06-30 ENCOUNTER — Encounter: Admit: 2022-06-30 | Discharge: 2022-06-30 | Payer: BC Managed Care – PPO

## 2022-06-30 ENCOUNTER — Ambulatory Visit: Admit: 2022-06-30 | Discharge: 2022-06-30 | Payer: BC Managed Care – PPO

## 2022-06-30 DIAGNOSIS — I10 Essential (primary) hypertension: Secondary | ICD-10-CM

## 2022-06-30 DIAGNOSIS — K859 Acute pancreatitis without necrosis or infection, unspecified: Secondary | ICD-10-CM

## 2022-06-30 DIAGNOSIS — R42 Dizziness and giddiness: Secondary | ICD-10-CM

## 2022-06-30 DIAGNOSIS — M549 Dorsalgia, unspecified: Secondary | ICD-10-CM

## 2022-06-30 DIAGNOSIS — R519 Generalized headaches: Secondary | ICD-10-CM

## 2022-06-30 DIAGNOSIS — F32A Depression: Secondary | ICD-10-CM

## 2022-06-30 DIAGNOSIS — J302 Other seasonal allergic rhinitis: Secondary | ICD-10-CM

## 2022-06-30 DIAGNOSIS — F419 Anxiety disorder, unspecified: Secondary | ICD-10-CM

## 2022-06-30 DIAGNOSIS — T753XXA Motion sickness, initial encounter: Secondary | ICD-10-CM

## 2022-06-30 DIAGNOSIS — N3010 Interstitial cystitis (chronic) without hematuria: Secondary | ICD-10-CM

## 2022-06-30 DIAGNOSIS — G5601 Carpal tunnel syndrome, right upper limb: Secondary | ICD-10-CM

## 2022-06-30 DIAGNOSIS — F99 Mental disorder, not otherwise specified: Secondary | ICD-10-CM

## 2022-06-30 DIAGNOSIS — T7840XA Allergy, unspecified, initial encounter: Secondary | ICD-10-CM

## 2022-06-30 DIAGNOSIS — Z9981 Dependence on supplemental oxygen: Secondary | ICD-10-CM

## 2022-06-30 DIAGNOSIS — G4733 Obstructive sleep apnea (adult) (pediatric): Secondary | ICD-10-CM

## 2022-06-30 DIAGNOSIS — R0600 Dyspnea, unspecified: Secondary | ICD-10-CM

## 2022-06-30 DIAGNOSIS — M1990 Unspecified osteoarthritis, unspecified site: Secondary | ICD-10-CM

## 2022-06-30 DIAGNOSIS — G44039 Episodic paroxysmal hemicrania, not intractable: Secondary | ICD-10-CM

## 2022-06-30 DIAGNOSIS — G43909 Migraine, unspecified, not intractable, without status migrainosus: Secondary | ICD-10-CM

## 2022-06-30 DIAGNOSIS — Z720 Tobacco use: Secondary | ICD-10-CM

## 2022-06-30 DIAGNOSIS — J449 Chronic obstructive pulmonary disease, unspecified: Secondary | ICD-10-CM

## 2022-06-30 DIAGNOSIS — E7800 Pure hypercholesterolemia, unspecified: Secondary | ICD-10-CM

## 2022-06-30 DIAGNOSIS — G5621 Lesion of ulnar nerve, right upper limb: Secondary | ICD-10-CM

## 2022-06-30 NOTE — Progress Notes
Subjective:       Here to follow-up after EMG/nerve conduction study.    Christina [place] is a 51 y.o. female.       Review of Systems   Constitutional: Negative.    HENT: Negative.    Eyes: Negative.    Respiratory: Negative.    Cardiovascular: Negative.    Gastrointestinal: Negative.    Endocrine: Negative.    Genitourinary: Negative.    Musculoskeletal: Negative.    Skin: Negative.    Allergic/Immunologic: Negative.    Neurological: Negative.    Hematological: Negative.    Psychiatric/Behavioral: Negative.          Objective:         ? biotin 5,000 mcg TbDi Dissolve 2 tablets by mouth twice daily.   ? buPROPion HCL SR (WELLBUTRIN SR) 100 mg tablet, 12 hr sustained-release Take one tablet by mouth twice daily.   ? cholecalciferol (VITAMIN D-3) 1,000 units tablet Take one tablet by mouth twice weekly.   ? clonazePAM (KLONOPIN) 0.5 mg tablet Take one tablet by mouth at bedtime daily.   ? clonazePAM (KLONOPIN) 1 mg tablet Take one tablet by mouth twice daily.   ? cyanocobalamin (vitamin B-12) 2,500 mcg tab Take one tablet by mouth daily with breakfast.   ? diphenhydrAMINE HCL (BENADRYL) 12.5 mg/5 mL oral solution Take 5 mL by mouth every 6 hours as needed.   ? divalproex (DEPAKOTE) 250 mg tablet,delayed release Take one tablet by mouth twice daily. Take with food.   ? ezetimibe (ZETIA) 10 mg tablet Take one tablet by mouth daily.   ? famotidine (PEPCID) 20 mg tablet Take one tablet by mouth daily as needed.   ? multivit, Ca, min-FA-soy isofl 400-60 mcg-mg tab Take 1 tablet by mouth twice daily.   ? mupirocin (CENTANY) 2 % topical ointment Apply  topically to affected area twice daily.   ? nebivoloL (BYSTOLIC) 5 mg tablet Take one tablet by mouth daily.   ? nicotine (NICOTROL) 10 mg inhaler Inhale one puff by mouth into the lungs as Needed. Puff by mouth as needed. May use 6-16 cartridges per day as needed for up to 6 months. Do not smoke while using inhaler.   ? ondansetron (ZOFRAN ODT) 4 mg rapid dissolve tablet Dissolve one tablet by mouth every 8 hours as needed for Nausea or Vomiting. Place on tongue to dissolve.   ? ondansetron (ZOFRAN ODT) 4 mg rapid dissolve tablet Dissolve two tablets by mouth every 8 hours as needed for Nausea or Vomiting. Place on tongue to dissolve.   ? pantoprazole DR (PROTONIX) 40 mg tablet TAKE 1 TABLET BY MOUTH EVERY DAY (Patient taking differently: Take one tablet by mouth as Needed.)   ? Potassium 99 mg tab Take one tablet by mouth three times weekly.   ? prochlorperazine maleate (COMPAZINE) 10 mg tablet Take one tablet by mouth every 6 hours as needed for Nausea or Vomiting.   ? riboflavin (vitamin B2) 400 mg tab Take one tablet by mouth daily.   ? rosuvastatin (CRESTOR) 20 mg tablet Take one tablet by mouth daily.   ? sertraline (ZOLOFT) 25 mg tablet Take one tablet by mouth daily. Please take with 50mg  tablets for total daily dose of 75mg    ? sertraline (ZOLOFT) 50 mg tablet Take one tablet by mouth daily. Please take with 25mg  tablet for total daily dose of 75mg    ? SUMAtriptan succinate (IMITREX) 100 mg tablet Take one tablet by mouth at onset of headache. May  repeat after 2 hours if needed. Max of 200 mg in 24 hours.  Limit use to less than 10 tablets per month.   ? verapamil (CALAN) 40 mg tablet Take 1/2 tablet by mouth in the morning and 1 tablet in the evening   ? vitamins, multi w/minerals 9 mg iron-400 mcg tab Take one tablet by mouth daily with breakfast.     Vitals:    06/30/22 1524   BP: 133/70   Pulse: 72   Temp: 36.4 ?C (97.6 ?F)   SpO2: 96%   TempSrc: Oral   PainSc: Three   Weight: 106.6 kg (235 lb)   Height: 165.1 cm (5' 5)     Body mass index is 39.11 kg/m?Marland Kitchen     Physical Exam  Physical Exam   Constitutional: Patient is oriented to person, place, and time. Patient appears well-developed and well-nourished.   HENT:   Head: Normocephalic and atraumatic.   Eyes: EOM are normal. Pupils are equal, round, and reactive to light.   Neck: Normal range of motion.   Cardiovascular: Normal rate, regular rhythm, normal heart sounds and intact distal pulses.    Pulmonary/Chest: Effort normal and breath sounds normal.   Neurological: Patient is alert and oriented to person, place, and time.   Skin: Skin is warm and dry. Capillary refill takes less than 2 seconds.   Psychiatric: Patient has a normal mood and affect. Behavior is normal. Judgment and thought content normal.     Right hand, ring finger with MCP joint flexion contracture of about 30 degrees, increasing numbness/tingling in small and ring fingers, positive Tinel's over the ulnar nerve the elbow, significant tenderness along the route of the ulnar nerve transposition       Assessment and Plan:  Right ulnar neuropathy at the elbow, recurrent  Reviewed EMG/nerve conduction information.  Unfortunately, no focal findings on the EMG.  Discussed revision of her ulnar nerve transposition at the elbow with potential adipose fascial flap versus nerve wrap.  Risks of worsening pain/numbness/tingling were described.  She wishes to proceed.  We will get her set up to go to therapy a few days after surgery to start working on gentle range of motion/nerve glides.  Her questions were answered.      Revision of right ulnar nerve transposition at the elbow  Regional anesthesia  90 minutes  Outpatient  Nerve wraps available  2-week follow-up  Start therapy 3 to 4 days after surgery, nerve glides, gentle active range of motion  [id number],

## 2022-07-02 ENCOUNTER — Encounter: Admit: 2022-07-02 | Discharge: 2022-07-02 | Payer: BC Managed Care – PPO

## 2022-07-02 DIAGNOSIS — F341 Dysthymic disorder: Secondary | ICD-10-CM

## 2022-07-02 DIAGNOSIS — F418 Other specified anxiety disorders: Secondary | ICD-10-CM

## 2022-07-02 DIAGNOSIS — F4310 Post-traumatic stress disorder, unspecified: Secondary | ICD-10-CM

## 2022-07-02 MED ORDER — DIVALPROEX 250 MG PO TBEC
250 mg | ORAL_TABLET | Freq: Two times a day (BID) | ORAL | 0 refills | 30.00000 days | Status: AC
Start: 2022-07-02 — End: ?

## 2022-07-04 ENCOUNTER — Encounter: Admit: 2022-07-04 | Discharge: 2022-07-04 | Payer: BC Managed Care – PPO

## 2022-07-04 ENCOUNTER — Ambulatory Visit: Admit: 2022-07-04 | Discharge: 2022-07-04 | Payer: BC Managed Care – PPO

## 2022-07-04 DIAGNOSIS — G5621 Lesion of ulnar nerve, right upper limb: Secondary | ICD-10-CM

## 2022-07-04 DIAGNOSIS — M7701 Medial epicondylitis, right elbow: Secondary | ICD-10-CM

## 2022-07-04 NOTE — Pre-Anesthesia Patient Instructions
PREPROCEDURE INFORMATION    Arrival at the hospital  [facility]  [address]  [place], [place] [address]    Park in the [employer], located directly across from the main entrance to the hospital.  Enter through the ground floor main hospital entrance and check in at the Information Desk in the lobby.  They will validate your parking ticket and direct you to the next location.  If you are a woman between the ages of 31 and 93, and have not had a hysterectomy, you will be asked for a urine sample prior to surgery.  Please do not urinate before arriving in the Surgery Waiting Room.  Once there, check in and let the attendant know if you need to provide a sample.  Phone carriers that use spam blockers will sometimes block our phone numbers. If your phone contact number is a mobile phone, please adjust your settings to make sure you receive our call.  In your phone settings, turn OFF the setting ?silence unknown callers.?  Please add these phone numbers to your contacts [phone] & [phone]     You will receive a call with your surgery arrival time between 2:30pm and 4:30pm the last business day before your procedure.  If you do not receive a call, please call [phone] before 4:30pm or [phone] after 4:30pm.    Eating or drinking before surgery  Nothing to eat after 11:00pm the night before your surgery including gum, mints and candy. You may have clear liquids up to 2 hours before your surgery time.     If you have received specific instructions from your surgeon, please follow those.     Clear Liquid Examples:   Water  Clear juice - Apple or cranberry (no pulp or orange juice) - If diabetic blood sugar must be <200  Coffee and tea with or without sugar (no cream)   Sports drinks - Powerade/Gatorade   Soda   Bowel Prep solutions only if ordered by your surgeon      Planning transportation for outpatient procedure  For your safety, you will need to arrange for a responsible ride/person to accompany you home due to sedation or anesthesia with your procedure.  An [facility], taxi or other public transportation driver is not considered a responsible person to accompany you home.    Bath/Shower Instructions  Take a bath or shower with antibacterial soap the night before and the morning of your procedure. Use clean towels.  Put on clean clothes after bath or shower.  Avoid using lotion and oils.  If you are having surgery above the waist, wear a shirt that fastens up the front.  Sleep on clean sheets if bath or shower is done the night before procedure.    Morning of your procedure:  Brush your teeth and tongue  Do not smoke, vape, chew or user any tobacco products.  Do not shave the area where you will have surgery.  Remove nail polish, makeup and all jewelry (including piercings) before coming to the hospital.  Dress in clean, loose, comfortable clothing.    Valuables  Leave money, credit cards, jewelry, and any other valuables at home. [facility] is not responsible for the loss or breakage of personal items.    What to bring to the hospital  ID/Insurance card  Medical Device card  Official documents for legal guardianship  Copy of your Living Will, Advanced Directives, and/or Durable Power of Attorney. If you have  these documents, please bring them to the admissions office on the day of your surgery to be scanned into your records.  Do not bring medications from home unless instructed by a pharmacist.  Cases for glasses/hearing aids/contact lens (bring solutions for contacts)     Notify us at [facility]: [phone] on the day of your procedure if:  You need to cancel your procedure.  You are going to be late.    Notify your surgeon if:  There is a possibility that you are pregnant.  You become ill with a cough, fever, sore throat, nausea, vomiting or flu-like symptoms.  You have any open wounds/sores that are red, painful, draining, or are new since you last saw the doctor.  You need to cancel your procedure.    Preparing to get your medications at discharge  Your surgeon may prescribe you medications to take after your procedure.  If you like the convenience of having your medications filled here at [place], please do the following:  Go to [facility] pharmacy after your [facility] appointment to put a credit card on file.  Call [facility] pharmacy at [phone] (Monday-Friday 7am-9pm or Saturday and Sunday 9am-5pm) to put a credit card on file.  Bring a credit card or cash on the day of your procedure- please leave with a family member rather than bringing it into the preop area.    Current Visitor Policy:  Visitors must be free of fever and symptoms to be in our facilities.  No more than 2 visitors per patient are allowed.  Additional guidelines may vary, based on patient care area or patient's condition.  Patients in semiprivate rooms may have visitors, but visits should be coordinated so only two total visitors are in a room at a time due to space limitations.  Children younger than age 49 are allowed to visit inpatients.    Thank you for participating in your Preoperative Assessment Clinic visit today.    If you have any changes to your health or hospitalizations between now and your surgery, please call us at [phone] for Main instructions.    Instructions given to patient via: MyChart    Christina [place], it was a pleasure speaking with you today.  These are the instructions we discussed.  If you have any changes in your health or questions about your instructions, please reach out to me via email or phone call.     Eunice Blase, RN   [phone]  [email or web address]

## 2022-07-04 NOTE — Pre-Anesthesia Medication Instructions
YOUR MEDICATION LIST     acetaminophen (TYLENOL EXTRA STRENGTH) 500 mg tablet Take one tablet by mouth every 6 hours as needed for Pain. Max of 4,000 mg of acetaminophen in 24 hours.    biotin 5,000 mcg TbDi Dissolve 2 tablets by mouth twice daily.    buPROPion HCL SR (WELLBUTRIN SR) 100 mg tablet, 12 hr sustained-release Take one tablet by mouth twice daily.    cholecalciferol (VITAMIN D-3) 1,000 units tablet Take one tablet by mouth twice weekly.    clonazePAM (KLONOPIN) 0.5 mg tablet Take one tablet by mouth at bedtime daily.    clonazePAM (KLONOPIN) 1 mg tablet Take one tablet by mouth twice daily.    cyanocobalamin (vitamin B-12) 2,500 mcg tab Take one tablet by mouth daily with breakfast.    diphenhydrAMINE HCL (BENADRYL) 12.5 mg/5 mL oral solution Take 5 mL by mouth every 6 hours as needed.    divalproex (DEPAKOTE) 250 mg tablet,delayed release TAKE ONE TABLET BY MOUTH TWICE DAILY. TAKE WITH FOOD.    ezetimibe (ZETIA) 10 mg tablet Take one tablet by mouth daily.    famotidine (PEPCID) 20 mg tablet Take one tablet by mouth daily as needed.    multivit, Ca, min-FA-soy isofl 400-60 mcg-mg tab Take 1 tablet by mouth twice daily.    mupirocin (CENTANY) 2 % topical ointment Apply  topically to affected area twice daily.    nebivoloL (BYSTOLIC) 5 mg tablet Take one tablet by mouth daily.    nicotine (NICOTROL) 10 mg inhaler Inhale one puff by mouth into the lungs as Needed. Puff by mouth as needed. May use 6-16 cartridges per day as needed for up to 6 months. Do not smoke while using inhaler. (Patient not taking: Reported on 07/04/2022)    ondansetron (ZOFRAN ODT) 4 mg rapid dissolve tablet Dissolve one tablet by mouth every 8 hours as needed for Nausea or Vomiting. Place on tongue to dissolve.    ondansetron (ZOFRAN ODT) 4 mg rapid dissolve tablet Dissolve two tablets by mouth every 8 hours as needed for Nausea or Vomiting. Place on tongue to dissolve.    pantoprazole DR (PROTONIX) 40 mg tablet TAKE 1 TABLET BY MOUTH EVERY DAY (Patient taking differently: Take one tablet by mouth as Needed.)    Potassium 99 mg tab Take one tablet by mouth three times weekly.    prochlorperazine maleate (COMPAZINE) 10 mg tablet Take one tablet by mouth every 6 hours as needed for Nausea or Vomiting.    riboflavin (vitamin B2) 400 mg tab Take one tablet by mouth daily. (Patient not taking: Reported on 07/04/2022)    rosuvastatin (CRESTOR) 20 mg tablet Take one tablet by mouth daily.    sertraline (ZOLOFT) 25 mg tablet Take one tablet by mouth daily. Please take with 50mg  tablets for total daily dose of 75mg     sertraline (ZOLOFT) 50 mg tablet Take one tablet by mouth daily. Please take with 25mg  tablet for total daily dose of 75mg     SUMAtriptan succinate (IMITREX) 100 mg tablet Take one tablet by mouth at onset of headache. May repeat after 2 hours if needed. Max of 200 mg in 24 hours.  Limit use to less than 10 tablets per month.    verapamil (CALAN) 40 mg tablet Take 1/2 tablet by mouth in the morning and 1 tablet in the evening    vitamins, multi w/minerals 9 mg iron-400 mcg tab Take one tablet by mouth daily with breakfast.  YOUR MEDICATION INSTRUCTIONS FOR SURGERY    Please continue taking your medicine as your doctor has told you to UNLESS it is listed to stop below.      14 DAYS BEFORE SURGERY - STOP NOW  Do not take the following vitamins, herbals, and supplements:  Bioltin  Multivitamin  Multivitamin With Iron  Do not start any new vitamins, herbals, or natural supplements before surgery.      7 DAYS BEFORE SURGERY  DO NOT TAKE the following anti-inflammatory medications such as ibuprofen (Advil, Motrin) and naproxen (Aleve)  You may use acetaminophen (Tylenol)      DAY BEFORE SURGERY  TAKE your medications the day before surgery as usual unless told differently      MORNING OF SURGERY  DO NOT take these medications:  Remaining vitamins/supplements  Ointments/creams/lotions  Vitamin D-3  Vitamin B-12  Ezetimibe  Potassium  Sumatriptan  Verapamil      TAKE these medications with a sip (1-2 ounces) of water:  Bupropion  Clonazepam  Divalproex  Nebivolol  Rosuvastatin  Sertraline  Use all inhalers, nasal sprays, and eye drops as usual  May take if needed:  Diphenhydramine  Famotidine  Ondansetron  Pantoprazole  Prochlorperazine      Please contact Debbie, RN, with any changes to your medication list or medication questions before surgery.  E-mail:  [email or web address]  Phone: [phone]    Before going home from the hospital, please ask your doctor when you should re-start any medicine that was stopped for surgery.

## 2022-07-08 ENCOUNTER — Encounter: Admit: 2022-07-08 | Discharge: 2022-07-08 | Payer: BC Managed Care – PPO

## 2022-07-08 DIAGNOSIS — M7711 Lateral epicondylitis, right elbow: Secondary | ICD-10-CM

## 2022-07-08 DIAGNOSIS — G5621 Lesion of ulnar nerve, right upper limb: Secondary | ICD-10-CM

## 2022-07-08 DIAGNOSIS — M7701 Medial epicondylitis, right elbow: Secondary | ICD-10-CM

## 2022-07-08 NOTE — Progress Notes
Post op therapy ordered per transcription from note on 06/30/2022.    Faxed to [place] at [phone]

## 2022-07-10 ENCOUNTER — Encounter: Admit: 2022-07-10 | Discharge: 2022-07-10 | Payer: BC Managed Care – PPO

## 2022-07-10 NOTE — Anesthesia Pre-Procedure Evaluation
BMI 39  Anesthesia Pre-Procedure Evaluation    Name: Christina Garrison      MRN: [id number]     DOB: 08/14/1971     Age: 51 y.o.     Sex: female   _________________________________________________________________________     Procedure Info:   Procedure Information     Date/Time: 07/11/22 0920    Procedure: NEUROPLASTY/ TRANSPOSITION ULNAR NERVE AT ELBOW (Right) - CASE LENGTH 90 MINUTES  NERVE WRAPS AVAILABLE    Location: MAIN OR 12 / Main OR/Periop    Surgeons: Nicky Pugh, MD          Physical Assessment  Vital Signs (last filed in past 24 hours):    Reviewed, see nursing documentation     Patient History   Allergies   Allergen Reactions   ? Effexor [Venlafaxine] SEE COMMENTS     Suicidal thoughts   ? Strawberry ANAPHYLAXIS and HIVES   ? Diovan [Valsartan] HIVES   ? Prozac [Fluoxetine] NAUSEA AND VOMITING and ANXIETY   ? Silver Sulfadiazine RASH   ? Topamax [Topiramate] SEE COMMENTS     Feels like she is going to pass out and speech messed up   ? Decadron [Dexamethasone] NAUSEA AND VOMITING and SEE COMMENTS     Also causes agitation    ? Gabapentin SEE COMMENTS     She can't remember what the reaction was but doesn't want to take it again.        Current Medications    Medication Directions   acetaminophen (TYLENOL EXTRA STRENGTH) 500 mg tablet Take one tablet by mouth every 6 hours as needed for Pain. Max of 4,000 mg of acetaminophen in 24 hours.   biotin 5,000 mcg TbDi Dissolve 2 tablets by mouth twice daily.   buPROPion HCL SR (WELLBUTRIN SR) 100 mg tablet, 12 hr sustained-release Take one tablet by mouth twice daily.   cholecalciferol (VITAMIN D-3) 1,000 units tablet Take one tablet by mouth twice weekly.   clonazePAM (KLONOPIN) 0.5 mg tablet Take one tablet by mouth at bedtime daily.   clonazePAM (KLONOPIN) 1 mg tablet Take one tablet by mouth twice daily.   cyanocobalamin (vitamin B-12) 2,500 mcg tab Take one tablet by mouth daily with breakfast.   diphenhydrAMINE HCL (BENADRYL) 12.5 mg/5 mL oral solution Take 5 mL by mouth every 6 hours as needed.   divalproex (DEPAKOTE) 250 mg tablet,delayed release TAKE ONE TABLET BY MOUTH TWICE DAILY. TAKE WITH FOOD.   ezetimibe (ZETIA) 10 mg tablet Take one tablet by mouth daily.   famotidine (PEPCID) 20 mg tablet Take one tablet by mouth daily as needed.   multivit, Ca, min-FA-soy isofl 400-60 mcg-mg tab Take 1 tablet by mouth twice daily.   mupirocin (CENTANY) 2 % topical ointment Apply  topically to affected area twice daily.   nebivoloL (BYSTOLIC) 5 mg tablet Take one tablet by mouth daily.   nicotine (NICOTROL) 10 mg inhaler Inhale one puff by mouth into the lungs as Needed. Puff by mouth as needed. May use 6-16 cartridges per day as needed for up to 6 months. Do not smoke while using inhaler.  Patient not taking: Reported on 07/04/2022   ondansetron (ZOFRAN ODT) 4 mg rapid dissolve tablet Dissolve one tablet by mouth every 8 hours as needed for Nausea or Vomiting. Place on tongue to dissolve.   ondansetron (ZOFRAN ODT) 4 mg rapid dissolve tablet Dissolve two tablets by mouth every 8 hours as needed for Nausea or Vomiting. Place on tongue to dissolve.  pantoprazole DR (PROTONIX) 40 mg tablet TAKE 1 TABLET BY MOUTH EVERY DAY  Patient taking differently: Take one tablet by mouth as Needed.   Potassium 99 mg tab Take one tablet by mouth three times weekly.   prochlorperazine maleate (COMPAZINE) 10 mg tablet Take one tablet by mouth every 6 hours as needed for Nausea or Vomiting.   riboflavin (vitamin B2) 400 mg tab Take one tablet by mouth daily.  Patient not taking: Reported on 07/04/2022   rosuvastatin (CRESTOR) 20 mg tablet Take one tablet by mouth daily.   sertraline (ZOLOFT) 25 mg tablet Take one tablet by mouth daily. Please take with 50mg  tablets for total daily dose of 75mg    sertraline (ZOLOFT) 50 mg tablet Take one tablet by mouth daily. Please take with 25mg  tablet for total daily dose of 75mg    SUMAtriptan succinate (IMITREX) 100 mg tablet Take one tablet by mouth at onset of headache. May repeat after 2 hours if needed. Max of 200 mg in 24 hours.  Limit use to less than 10 tablets per month.   verapamil (CALAN) 40 mg tablet Take 1/2 tablet by mouth in the morning and 1 tablet in the evening   vitamins, multi w/minerals 9 mg iron-400 mcg tab Take one tablet by mouth daily with breakfast.         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), mild       No recent URI      Shortness of breath      Not on home oxygen      Cardiovascular         Exercise tolerance: >4 METS       Beta Blocker therapy: Yes      Beta blockers within 24 hours: Yes      Hypertension (124/78), well controlled            No hx of coronary artery disease        No palpitations      No angina      Hyperlipidemia (statin)      No orthopnea      No dyspnea on exertion      GI/Hepatic/Renal             GERD (PPI), well controlled        No liver disease:      No renal disease:        No electrolyte problems        History of pancreatitis      No hepatitis      Neuro/Psych       No seizures      No CVA      Headaches      No chronic opioid use      Chronic benzodiazepine use (clonazepam daily)      No indications/hx of sensory deficit        Psychiatric history  Depression          Anxiety      Musculoskeletal         Neck pain      Back pain      No arthritis:         Endocrine/Other - negative      No diabetes        No hypothyroidism      No anemia        No autoimmune disease      Obesity: morbid obesity (BMI > 40)      Constitution - negative       Physical Exam    Airway Findings Mallampati: II      TM distance: >3 FB      Neck ROM: full      Mouth opening: good      Airway patency: adequate    Dental Findings: Negative            Cardiovascular Findings: Negative        No murmur, no carotid bruit, no peripheral edema    Pulmonary Findings: Negative      Abdominal Findings: Negative      Obese    Constitutional findings: Negative       Diagnostic Tests  Hematology:   Lab Results   Component Value Date    HGB 13.8 10/10/2020    HCT 41.3 10/10/2020    PLTCT 277 10/10/2020    WBC 10.9 10/10/2020    NEUT 60 06/09/2019    ANC 7.23 06/09/2019    ALC 3.78 06/09/2019    MONA 7 06/09/2019    AMC 0.79 06/09/2019    EOSA 1 06/09/2019    ABC 0.07 06/09/2019    MCV 98.6 10/10/2020    MCH 32.9 10/10/2020    MCHC 33.3 10/10/2020    MPV 9.1 10/10/2020    RDW 12.6 10/10/2020         General Chemistry:   Lab Results   Component Value Date    NA 139 08/16/2021    K 4.4 08/16/2021    CL 108 08/16/2021    CO2 26.0 08/16/2021    GAP 5 08/16/2021    BUN 9.0 08/16/2021    CR 0.65 08/16/2021    GLU 91 08/16/2021    CA 8.5 08/16/2021    ALBUMIN 4.1 10/10/2020    TOTBILI 0.3 10/10/2020      Coagulation: No results found for: PT, PTT, INR      06/07/20 Cardiology pre-op eval.     06/21/20 Echo  The left ventricular size, wall thickness and systolic function are normal. The ejection fraction by Simpson's biplane method is 60%. Normal left ventricular diastolic function.      Cardiac Risk assessment, 06/21/20 Dr. Arna Medici:  Based upon the results of the patient's clinical profile and non-invasive cardiovascular testing, the risk for cardiovascular morbidity and mortality associated with non-cardiac surgery is INTERMEDIATE. This by no means precludes surgery but provides information for the surgeon, anesthesiologist and patient concerning the risks of surgery.  A decision to proceed with surgery should be made based upon the relative benefits and risks involved. Indeed, breast reduction and liposuction could be a very helpful intervention for this patient's overall health maintenance and could prove very helpful for improvement in her exercise tolerance, functional capacity and cardiovascular stability. There are no absolute contra-indications to elective surgery, as deemed necessary. No additional cardiovascular testing is required at this time. SBG  Anesthesia Plan    ASA score: 3   Plan: regional and MAC  Induction method: intravenous  NPO status: acceptable      Informed Consent  Anesthetic plan and risks discussed with patient.  Use of blood products discussed with patient  Blood Consent: consented      Plan discussed with: anesthesiologist and surgeon/proceduralist.      PAC Plan

## 2022-07-11 ENCOUNTER — Encounter: Admit: 2022-07-11 | Discharge: 2022-07-11 | Payer: BC Managed Care – PPO

## 2022-07-11 ENCOUNTER — Ambulatory Visit: Admit: 2022-07-11 | Discharge: 2022-07-11 | Payer: BC Managed Care – PPO

## 2022-07-11 DIAGNOSIS — F99 Mental disorder, not otherwise specified: Secondary | ICD-10-CM

## 2022-07-11 DIAGNOSIS — E7800 Pure hypercholesterolemia, unspecified: Secondary | ICD-10-CM

## 2022-07-11 DIAGNOSIS — N3010 Interstitial cystitis (chronic) without hematuria: Secondary | ICD-10-CM

## 2022-07-11 DIAGNOSIS — F32A Depression: Secondary | ICD-10-CM

## 2022-07-11 DIAGNOSIS — M549 Dorsalgia, unspecified: Secondary | ICD-10-CM

## 2022-07-11 DIAGNOSIS — K859 Acute pancreatitis without necrosis or infection, unspecified: Secondary | ICD-10-CM

## 2022-07-11 DIAGNOSIS — G4733 Obstructive sleep apnea (adult) (pediatric): Secondary | ICD-10-CM

## 2022-07-11 DIAGNOSIS — G43909 Migraine, unspecified, not intractable, without status migrainosus: Secondary | ICD-10-CM

## 2022-07-11 DIAGNOSIS — I10 Essential (primary) hypertension: Secondary | ICD-10-CM

## 2022-07-11 DIAGNOSIS — J449 Chronic obstructive pulmonary disease, unspecified: Secondary | ICD-10-CM

## 2022-07-11 DIAGNOSIS — T753XXA Motion sickness, initial encounter: Secondary | ICD-10-CM

## 2022-07-11 DIAGNOSIS — R519 Generalized headaches: Secondary | ICD-10-CM

## 2022-07-11 DIAGNOSIS — G5601 Carpal tunnel syndrome, right upper limb: Secondary | ICD-10-CM

## 2022-07-11 DIAGNOSIS — Z9981 Dependence on supplemental oxygen: Secondary | ICD-10-CM

## 2022-07-11 DIAGNOSIS — R0600 Dyspnea, unspecified: Secondary | ICD-10-CM

## 2022-07-11 DIAGNOSIS — Z720 Tobacco use: Secondary | ICD-10-CM

## 2022-07-11 DIAGNOSIS — R42 Dizziness and giddiness: Secondary | ICD-10-CM

## 2022-07-11 DIAGNOSIS — J302 Other seasonal allergic rhinitis: Secondary | ICD-10-CM

## 2022-07-11 DIAGNOSIS — M1990 Unspecified osteoarthritis, unspecified site: Secondary | ICD-10-CM

## 2022-07-11 DIAGNOSIS — T7840XA Allergy, unspecified, initial encounter: Secondary | ICD-10-CM

## 2022-07-11 DIAGNOSIS — F419 Anxiety disorder, unspecified: Secondary | ICD-10-CM

## 2022-07-11 DIAGNOSIS — G44039 Episodic paroxysmal hemicrania, not intractable: Secondary | ICD-10-CM

## 2022-07-11 MED ORDER — MIDAZOLAM 1 MG/ML IJ SOLN
INTRAVENOUS | 0 refills | Status: CP
Start: 2022-07-11 — End: ?

## 2022-07-11 MED ORDER — LIDOCAINE (PF) 20 MG/ML (2 %) IJ SOLN
0 refills | Status: CP
Start: 2022-07-11 — End: ?

## 2022-07-11 MED ORDER — ROPIVACAINE (PF) 5 MG/ML (0.5 %) IJ SOLN
0 refills | Status: CP
Start: 2022-07-11 — End: ?

## 2022-07-11 MED ORDER — ONDANSETRON HCL (PF) 4 MG/2 ML IJ SOLN
INTRAVENOUS | 0 refills | Status: DC
Start: 2022-07-11 — End: 2022-07-11

## 2022-07-11 MED ORDER — CEFAZOLIN 1 GRAM IJ SOLR
INTRAVENOUS | 0 refills | Status: DC
Start: 2022-07-11 — End: 2022-07-11

## 2022-07-11 MED ORDER — PROPOFOL INJ 10 MG/ML IV VIAL
INTRAVENOUS | 0 refills | Status: DC
Start: 2022-07-11 — End: 2022-07-11

## 2022-07-11 MED ORDER — FENTANYL CITRATE (PF) 50 MCG/ML IJ SOLN
INTRAVENOUS | 0 refills | Status: CP
Start: 2022-07-11 — End: ?

## 2022-07-11 MED ORDER — LIDOCAINE (PF) 20 MG/ML (2 %) IJ SOLN
INTRAVENOUS | 0 refills | Status: DC
Start: 2022-07-11 — End: 2022-07-11

## 2022-07-11 MED ORDER — PROPOFOL 10 MG/ML IV EMUL 50 ML (INFUSION)(AM)(OR)
INTRAVENOUS | 0 refills | Status: DC
Start: 2022-07-11 — End: 2022-07-11
  Administered 2022-07-11: 16:00:00 125 ug/kg/min via INTRAVENOUS

## 2022-07-11 MED ADMIN — LACTATED RINGERS IV SOLP [4318]: 1000 mL | INTRAVENOUS | @ 15:00:00 | Stop: 2022-07-11 | NDC 00338011704

## 2022-07-11 MED ADMIN — ACETAMINOPHEN 500 MG PO TAB [102]: 1000 mg | ORAL | @ 18:00:00 | Stop: 2022-07-11 | NDC 00904672080

## 2022-07-13 ENCOUNTER — Encounter: Admit: 2022-07-13 | Discharge: 2022-07-13 | Payer: BC Managed Care – PPO

## 2022-07-13 DIAGNOSIS — R42 Dizziness and giddiness: Secondary | ICD-10-CM

## 2022-07-13 DIAGNOSIS — E7800 Pure hypercholesterolemia, unspecified: Secondary | ICD-10-CM

## 2022-07-13 DIAGNOSIS — M549 Dorsalgia, unspecified: Secondary | ICD-10-CM

## 2022-07-13 DIAGNOSIS — Z720 Tobacco use: Secondary | ICD-10-CM

## 2022-07-13 DIAGNOSIS — M1990 Unspecified osteoarthritis, unspecified site: Secondary | ICD-10-CM

## 2022-07-13 DIAGNOSIS — I10 Essential (primary) hypertension: Secondary | ICD-10-CM

## 2022-07-13 DIAGNOSIS — J449 Chronic obstructive pulmonary disease, unspecified: Secondary | ICD-10-CM

## 2022-07-13 DIAGNOSIS — F32A Depression: Secondary | ICD-10-CM

## 2022-07-13 DIAGNOSIS — T7840XA Allergy, unspecified, initial encounter: Secondary | ICD-10-CM

## 2022-07-13 DIAGNOSIS — G43909 Migraine, unspecified, not intractable, without status migrainosus: Secondary | ICD-10-CM

## 2022-07-13 DIAGNOSIS — F99 Mental disorder, not otherwise specified: Secondary | ICD-10-CM

## 2022-07-13 DIAGNOSIS — F419 Anxiety disorder, unspecified: Secondary | ICD-10-CM

## 2022-07-13 DIAGNOSIS — K859 Acute pancreatitis without necrosis or infection, unspecified: Secondary | ICD-10-CM

## 2022-07-13 DIAGNOSIS — J302 Other seasonal allergic rhinitis: Secondary | ICD-10-CM

## 2022-07-13 DIAGNOSIS — G44039 Episodic paroxysmal hemicrania, not intractable: Secondary | ICD-10-CM

## 2022-07-13 DIAGNOSIS — N3010 Interstitial cystitis (chronic) without hematuria: Secondary | ICD-10-CM

## 2022-07-13 DIAGNOSIS — T753XXA Motion sickness, initial encounter: Secondary | ICD-10-CM

## 2022-07-13 DIAGNOSIS — R0600 Dyspnea, unspecified: Secondary | ICD-10-CM

## 2022-07-13 DIAGNOSIS — R519 Generalized headaches: Secondary | ICD-10-CM

## 2022-07-13 DIAGNOSIS — Z9981 Dependence on supplemental oxygen: Secondary | ICD-10-CM

## 2022-07-13 DIAGNOSIS — G5601 Carpal tunnel syndrome, right upper limb: Secondary | ICD-10-CM

## 2022-07-13 DIAGNOSIS — G4733 Obstructive sleep apnea (adult) (pediatric): Secondary | ICD-10-CM

## 2022-07-14 ENCOUNTER — Encounter: Admit: 2022-07-14 | Discharge: 2022-07-14 | Payer: BC Managed Care – PPO

## 2022-07-15 ENCOUNTER — Encounter: Admit: 2022-07-15 | Discharge: 2022-07-15 | Payer: BC Managed Care – PPO

## 2022-07-15 MED ORDER — ROSUVASTATIN 20 MG PO TAB
20 mg | ORAL_TABLET | Freq: Every day | ORAL | 3 refills | 90.00000 days | Status: AC
Start: 2022-07-15 — End: ?

## 2022-07-23 ENCOUNTER — Encounter: Admit: 2022-07-23 | Discharge: 2022-07-23 | Payer: BC Managed Care – PPO

## 2022-07-23 ENCOUNTER — Ambulatory Visit: Admit: 2022-07-23 | Discharge: 2022-07-24 | Payer: BC Managed Care – PPO

## 2022-07-23 DIAGNOSIS — G43909 Migraine, unspecified, not intractable, without status migrainosus: Secondary | ICD-10-CM

## 2022-07-23 DIAGNOSIS — Z9981 Dependence on supplemental oxygen: Secondary | ICD-10-CM

## 2022-07-23 DIAGNOSIS — G4733 Obstructive sleep apnea (adult) (pediatric): Secondary | ICD-10-CM

## 2022-07-23 DIAGNOSIS — I10 Essential (primary) hypertension: Secondary | ICD-10-CM

## 2022-07-23 DIAGNOSIS — E7800 Pure hypercholesterolemia, unspecified: Secondary | ICD-10-CM

## 2022-07-23 DIAGNOSIS — F32A Depression: Secondary | ICD-10-CM

## 2022-07-23 DIAGNOSIS — M1990 Unspecified osteoarthritis, unspecified site: Secondary | ICD-10-CM

## 2022-07-23 DIAGNOSIS — N3010 Interstitial cystitis (chronic) without hematuria: Secondary | ICD-10-CM

## 2022-07-23 DIAGNOSIS — T7840XA Allergy, unspecified, initial encounter: Secondary | ICD-10-CM

## 2022-07-23 DIAGNOSIS — J302 Other seasonal allergic rhinitis: Secondary | ICD-10-CM

## 2022-07-23 DIAGNOSIS — F419 Anxiety disorder, unspecified: Secondary | ICD-10-CM

## 2022-07-23 DIAGNOSIS — R42 Dizziness and giddiness: Secondary | ICD-10-CM

## 2022-07-23 DIAGNOSIS — M549 Dorsalgia, unspecified: Secondary | ICD-10-CM

## 2022-07-23 DIAGNOSIS — T753XXA Motion sickness, initial encounter: Secondary | ICD-10-CM

## 2022-07-23 DIAGNOSIS — J449 Chronic obstructive pulmonary disease, unspecified: Secondary | ICD-10-CM

## 2022-07-23 DIAGNOSIS — R519 Generalized headaches: Secondary | ICD-10-CM

## 2022-07-23 DIAGNOSIS — G44039 Episodic paroxysmal hemicrania, not intractable: Secondary | ICD-10-CM

## 2022-07-23 DIAGNOSIS — G5621 Lesion of ulnar nerve, right upper limb: Secondary | ICD-10-CM

## 2022-07-23 DIAGNOSIS — G5601 Carpal tunnel syndrome, right upper limb: Secondary | ICD-10-CM

## 2022-07-23 DIAGNOSIS — F99 Mental disorder, not otherwise specified: Secondary | ICD-10-CM

## 2022-07-23 DIAGNOSIS — K859 Acute pancreatitis without necrosis or infection, unspecified: Secondary | ICD-10-CM

## 2022-07-23 DIAGNOSIS — Z720 Tobacco use: Secondary | ICD-10-CM

## 2022-07-23 DIAGNOSIS — R0600 Dyspnea, unspecified: Secondary | ICD-10-CM

## 2022-07-23 NOTE — Progress Notes
Date of Service: 07/23/2022    Subjective:             Christina Garrison is a 51 y.o. female.    History of Present Illness  Overall doing well, pain much improved, managing things with Tylenol, drain output still fairly high, greater than 50 mL/day.     Review of Systems   Constitutional: Negative.    HENT: Negative.    Eyes: Negative.    Respiratory: Negative.    Cardiovascular: Negative.    Gastrointestinal: Negative.    Endocrine: Negative.    Genitourinary: Negative.    Musculoskeletal: Negative.    Skin: Negative.    Allergic/Immunologic: Negative.    Neurological: Negative.    Hematological: Negative.    Psychiatric/Behavioral: Negative.    All other systems reviewed and are negative.        Objective:         ? acetaminophen (TYLENOL EXTRA STRENGTH) 500 mg tablet Take one tablet by mouth every 6 hours as needed for Pain. Max of 4,000 mg of acetaminophen in 24 hours.   ? biotin 5,000 mcg TbDi Dissolve 2 tablets by mouth twice daily.   ? buPROPion HCL SR (WELLBUTRIN SR) 100 mg tablet, 12 hr sustained-release Take one tablet by mouth twice daily.   ? cholecalciferol (VITAMIN D-3) 1,000 units tablet Take one tablet by mouth twice weekly.   ? clonazePAM (KLONOPIN) 0.5 mg tablet Take one tablet by mouth at bedtime daily.   ? clonazePAM (KLONOPIN) 1 mg tablet Take one tablet by mouth twice daily.   ? cyanocobalamin (vitamin B-12) 2,500 mcg tab Take one tablet by mouth daily with breakfast.   ? diphenhydrAMINE HCL (BENADRYL) 12.5 mg/5 mL oral solution Take 5 mL by mouth every 6 hours as needed.   ? divalproex (DEPAKOTE) 250 mg tablet,delayed release TAKE ONE TABLET BY MOUTH TWICE DAILY. TAKE WITH FOOD.   ? ezetimibe (ZETIA) 10 mg tablet Take one tablet by mouth daily.   ? famotidine (PEPCID) 20 mg tablet Take one tablet by mouth daily as needed.   ? multivit, Ca, min-FA-soy isofl 400-60 mcg-mg tab Take 1 tablet by mouth twice daily.   ? mupirocin (CENTANY) 2 % topical ointment Apply  topically to affected area twice daily.   ? nebivoloL (BYSTOLIC) 5 mg tablet Take one tablet by mouth daily.   ? nicotine (NICOTROL) 10 mg inhaler Inhale one puff by mouth into the lungs as Needed. Puff by mouth as needed. May use 6-16 cartridges per day as needed for up to 6 months. Do not smoke while using inhaler.   ? ondansetron (ZOFRAN ODT) 4 mg rapid dissolve tablet Dissolve one tablet by mouth every 8 hours as needed for Nausea or Vomiting. Place on tongue to dissolve.   ? pantoprazole DR (PROTONIX) 40 mg tablet TAKE 1 TABLET BY MOUTH EVERY DAY (Patient taking differently: Take one tablet by mouth as Needed.)   ? Potassium 99 mg tab Take one tablet by mouth three times weekly.   ? prochlorperazine maleate (COMPAZINE) 10 mg tablet Take one tablet by mouth every 6 hours as needed for Nausea or Vomiting.   ? riboflavin (vitamin B2) 400 mg tab Take one tablet by mouth daily.   ? rosuvastatin (CRESTOR) 20 mg tablet TAKE 1 TABLET BY MOUTH EVERY DAY   ? sertraline (ZOLOFT) 25 mg tablet Take one tablet by mouth daily. Please take with 50mg  tablets for total daily dose of 75mg    ? sertraline (ZOLOFT) 50  mg tablet Take one tablet by mouth daily. Please take with 25mg  tablet for total daily dose of 75mg    ? SUMAtriptan succinate (IMITREX) 100 mg tablet Take one tablet by mouth at onset of headache. May repeat after 2 hours if needed. Max of 200 mg in 24 hours.  Limit use to less than 10 tablets per month.   ? verapamil (CALAN) 40 mg tablet Take 1/2 tablet by mouth in the morning and 1 tablet in the evening   ? vitamins, multi w/minerals 9 mg iron-400 mcg tab Take one tablet by mouth daily with breakfast.     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  Told patient to call the office when he gets down to 30 mL in a 24-hour period, 2 days in a row.  Follow-up with me in 4 weeks.

## 2022-07-28 ENCOUNTER — Ambulatory Visit: Admit: 2022-07-28 | Discharge: 2022-07-29 | Payer: BC Managed Care – PPO

## 2022-07-28 ENCOUNTER — Encounter: Admit: 2022-07-28 | Discharge: 2022-07-28 | Payer: BC Managed Care – PPO

## 2022-07-28 DIAGNOSIS — G5621 Lesion of ulnar nerve, right upper limb: Secondary | ICD-10-CM

## 2022-07-28 DIAGNOSIS — M7701 Medial epicondylitis, right elbow: Secondary | ICD-10-CM

## 2022-07-28 NOTE — Progress Notes
Procedure: Drain Removal    The drain was removed with gentle traction and inspected and found to be intact.  The site was covered with gauze pad and a band aid.    Drain Location: right upper arm     Removal date: 62/37/6283    Complications: none    Removal Reason: Clinically Indicated as patient was having less than 246m of drainage in a 24 hour period for 2 days in a row.

## 2022-07-31 ENCOUNTER — Encounter: Admit: 2022-07-31 | Discharge: 2022-07-31 | Payer: BC Managed Care – PPO

## 2022-07-31 DIAGNOSIS — M7701 Medial epicondylitis, right elbow: Secondary | ICD-10-CM

## 2022-07-31 DIAGNOSIS — G5621 Lesion of ulnar nerve, right upper limb: Secondary | ICD-10-CM

## 2022-07-31 DIAGNOSIS — M7711 Lateral epicondylitis, right elbow: Secondary | ICD-10-CM

## 2022-07-31 NOTE — Telephone Encounter
Per verbal order by Dr. Felecia Shelling "nerve glides, strengthening and gentle active range of motion"    Order placed and faxed to [facility] at [phone]

## 2022-07-31 NOTE — Telephone Encounter
-----   Message from [facility]. Kingsley sent at 07/31/2022 11:01 AM CST -----  Regarding: Drainage chart  Contact: [phone]  Einar Pheasant,   My PT would like to know if she needs to start doing strength exercises with the elbow/arm and if so could you please fax over a order so she may do that.  I am sending over her info with fax.   If I do not need that she will continue with what we have been doing.   Thank you   Teneisha Judene Companion

## 2022-08-13 ENCOUNTER — Encounter: Admit: 2022-08-13 | Discharge: 2022-08-13 | Payer: BC Managed Care – PPO

## 2022-08-17 ENCOUNTER — Encounter: Admit: 2022-08-17 | Discharge: 2022-08-17 | Payer: BC Managed Care – PPO

## 2022-08-17 MED ORDER — NICOTROL 10 MG IN CRTG
1 | RESPIRATORY_TRACT | 1 refills | PRN
Start: 2022-08-17 — End: ?

## 2022-08-20 ENCOUNTER — Encounter: Admit: 2022-08-20 | Discharge: 2022-08-20 | Payer: BC Managed Care – PPO

## 2022-08-20 MED ORDER — NICOTROL NS 10 MG/ML NA SPRY
RESPIRATORY_TRACT | 11 refills | 28.00000 days | Status: AC
Start: 2022-08-20 — End: ?

## 2022-08-20 MED ORDER — NICOTROL 10 MG IN CRTG
1 refills
Start: 2022-08-20 — End: ?

## 2022-08-21 ENCOUNTER — Encounter: Admit: 2022-08-21 | Discharge: 2022-08-21 | Payer: BC Managed Care – PPO

## 2022-08-21 DIAGNOSIS — I10 Essential (primary) hypertension: Secondary | ICD-10-CM

## 2022-08-21 MED ORDER — NEBIVOLOL 5 MG PO TAB
5 mg | ORAL_TABLET | Freq: Every day | ORAL | 3 refills | 60.00000 days | Status: AC
Start: 2022-08-21 — End: ?

## 2022-08-27 ENCOUNTER — Ambulatory Visit: Admit: 2022-08-27 | Discharge: 2022-08-28 | Payer: BC Managed Care – PPO

## 2022-08-27 ENCOUNTER — Encounter: Admit: 2022-08-27 | Discharge: 2022-08-27 | Payer: BC Managed Care – PPO

## 2022-08-27 DIAGNOSIS — F99 Mental disorder, not otherwise specified: Secondary | ICD-10-CM

## 2022-08-27 DIAGNOSIS — F419 Anxiety disorder, unspecified: Secondary | ICD-10-CM

## 2022-08-27 DIAGNOSIS — Z9981 Dependence on supplemental oxygen: Secondary | ICD-10-CM

## 2022-08-27 DIAGNOSIS — R0600 Dyspnea, unspecified: Secondary | ICD-10-CM

## 2022-08-27 DIAGNOSIS — G5601 Carpal tunnel syndrome, right upper limb: Secondary | ICD-10-CM

## 2022-08-27 DIAGNOSIS — Z720 Tobacco use: Secondary | ICD-10-CM

## 2022-08-27 DIAGNOSIS — T753XXA Motion sickness, initial encounter: Secondary | ICD-10-CM

## 2022-08-27 DIAGNOSIS — K859 Acute pancreatitis without necrosis or infection, unspecified: Secondary | ICD-10-CM

## 2022-08-27 DIAGNOSIS — G4733 Obstructive sleep apnea (adult) (pediatric): Secondary | ICD-10-CM

## 2022-08-27 DIAGNOSIS — R519 Generalized headaches: Secondary | ICD-10-CM

## 2022-08-27 DIAGNOSIS — G44039 Episodic paroxysmal hemicrania, not intractable: Secondary | ICD-10-CM

## 2022-08-27 DIAGNOSIS — J302 Other seasonal allergic rhinitis: Secondary | ICD-10-CM

## 2022-08-27 DIAGNOSIS — M1990 Unspecified osteoarthritis, unspecified site: Secondary | ICD-10-CM

## 2022-08-27 DIAGNOSIS — G43909 Migraine, unspecified, not intractable, without status migrainosus: Secondary | ICD-10-CM

## 2022-08-27 DIAGNOSIS — J449 Chronic obstructive pulmonary disease, unspecified: Secondary | ICD-10-CM

## 2022-08-27 DIAGNOSIS — E7800 Pure hypercholesterolemia, unspecified: Secondary | ICD-10-CM

## 2022-08-27 DIAGNOSIS — R42 Dizziness and giddiness: Secondary | ICD-10-CM

## 2022-08-27 DIAGNOSIS — M549 Dorsalgia, unspecified: Secondary | ICD-10-CM

## 2022-08-27 DIAGNOSIS — M24541 Contracture, right hand: Secondary | ICD-10-CM

## 2022-08-27 DIAGNOSIS — N3010 Interstitial cystitis (chronic) without hematuria: Secondary | ICD-10-CM

## 2022-08-27 DIAGNOSIS — I10 Essential (primary) hypertension: Secondary | ICD-10-CM

## 2022-08-27 DIAGNOSIS — T7840XA Allergy, unspecified, initial encounter: Secondary | ICD-10-CM

## 2022-08-27 DIAGNOSIS — F32A Depression: Secondary | ICD-10-CM

## 2022-08-27 NOTE — Progress Notes
Subjective:       Here to follow-up.  Overall doing well.  Notes that the pain at her elbow is mostly gone and that her sensation in her small and ring fingers is slowly improving.  Does get stinging/burning sensation in her arm at times, mostly with elbow flexion.      Christina Garrison is a 51 y.o. female.       Review of Systems   Constitutional: Negative.    HENT: Negative.     Eyes: Negative.    Respiratory: Negative.     Cardiovascular: Negative.    Gastrointestinal: Negative.    Endocrine: Negative.    Genitourinary: Negative.    Musculoskeletal: Negative.    Skin: Negative.    Allergic/Immunologic: Negative.    Neurological: Negative.    Hematological: Negative.    Psychiatric/Behavioral: Negative.         Objective:          acetaminophen (TYLENOL EXTRA STRENGTH) 500 mg tablet Take one tablet by mouth every 6 hours as needed for Pain. Max of 4,000 mg of acetaminophen in 24 hours.    biotin 5,000 mcg TbDi Dissolve 2 tablets by mouth twice daily.    buPROPion HCL SR (WELLBUTRIN SR) 100 mg tablet, 12 hr sustained-release Take one tablet by mouth twice daily.    cholecalciferol (VITAMIN D-3) 1,000 units tablet Take one tablet by mouth twice weekly.    clonazePAM (KLONOPIN) 0.5 mg tablet Take one tablet by mouth at bedtime daily.    clonazePAM (KLONOPIN) 1 mg tablet Take one tablet by mouth twice daily.    cyanocobalamin (vitamin B-12) 2,500 mcg tab Take one tablet by mouth daily with breakfast.    diphenhydrAMINE HCL (BENADRYL) 12.5 mg/5 mL oral solution Take 5 mL by mouth every 6 hours as needed.    divalproex (DEPAKOTE) 250 mg tablet,delayed release TAKE ONE TABLET BY MOUTH TWICE DAILY. TAKE WITH FOOD.    ezetimibe (ZETIA) 10 mg tablet Take one tablet by mouth daily.    famotidine (PEPCID) 20 mg tablet Take one tablet by mouth daily as needed.    multivit, Ca, min-FA-soy isofl 400-60 mcg-mg tab Take 1 tablet by mouth twice daily.    mupirocin (CENTANY) 2 % topical ointment Apply  topically to affected area twice daily.    nebivoloL (BYSTOLIC) 5 mg tablet TAKE 1 TABLET BY MOUTH DAILY    nicotine (NICOTROL NS) 10 mg/mL nasal spray Use 1 spray in each nostril 1-2 times per hour, not to exceed 40 times per day, for up to 12 weeks.    NICOTROL 10 mg inhaler INHALE ONE PUFF BY MOUTH INTO THE LUNGS AS NEEDED. PUFF BY MOUTH AS NEEDED. MAY USE 6-16 CARTRIDGES PER DAY AS NEEDED FOR UP TO 6 MONTHS. DO NOT SMOKE WHILE USING INHALER.    ondansetron (ZOFRAN ODT) 4 mg rapid dissolve tablet Dissolve one tablet by mouth every 8 hours as needed for Nausea or Vomiting. Place on tongue to dissolve.    pantoprazole DR (PROTONIX) 40 mg tablet TAKE 1 TABLET BY MOUTH EVERY DAY (Patient taking differently: Take one tablet by mouth as Needed.)    Potassium 99 mg tab Take one tablet by mouth three times weekly.    prochlorperazine maleate (COMPAZINE) 10 mg tablet Take one tablet by mouth every 6 hours as needed for Nausea or Vomiting.    riboflavin (vitamin B2) 400 mg tab Take one tablet by mouth daily.    rosuvastatin (CRESTOR) 20 mg tablet TAKE 1  TABLET BY MOUTH EVERY DAY    sertraline (ZOLOFT) 25 mg tablet Take one tablet by mouth daily. Please take with 50mg  tablets for total daily dose of 75mg     sertraline (ZOLOFT) 50 mg tablet Take one tablet by mouth daily. Please take with 25mg  tablet for total daily dose of 75mg     SUMAtriptan succinate (IMITREX) 100 mg tablet Take one tablet by mouth at onset of headache. May repeat after 2 hours if needed. Max of 200 mg in 24 hours.  Limit use to less than 10 tablets per month.    verapamil (CALAN) 40 mg tablet Take 1/2 tablet by mouth in the morning and 1 tablet in the evening    vitamins, multi w/minerals 9 mg iron-400 mcg tab Take one tablet by mouth daily with breakfast.     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  She has made good improvements.  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  I told her that this is a frustrating problem and that the best answer that I have would be a two-stage solution.  The for stage would be fat grafting in her palm between the skin and the scar to try to get a little bit better skin quality with the second stage being potentially another round of fat grafting versus surgical scar excision.  She is going to consider this.  In the meantime we are going to have therapy make a nighttime resting splint for her.  She will come back and follow-up with me on an as-needed basis.

## 2022-08-27 NOTE — Progress Notes
Schedule pre-op visit if patient calls wishing to pursue further surgeries discussed today

## 2022-08-27 NOTE — Progress Notes
Hand Therapy Daily Note:  Name:Christina Garrison  DOB: 12/20/1970  MRN#: [id number]  Referring Physician:D Megee  Insurance: [employer]  Injury/Onset Date:            Surgical Date:    02/20/2022  Medical Diagnosis: contracture of finger joint, right; cubital tunnel, right  Right ring finger MCP joint capsulectomy and right ring finger flexor tenolysis  Treatment Diagnosis: same  Date of Initial Evaluation: 03/05/2022  Follow-up Physician Appointment: 3 months  Visit # 3      Subjective: "I left my splint in a restaurant about 5 weeks ago."    Pain: Right:  Location: Hand    Pain Rating:   Current: 3/10     Objective:    Evaluation:     Hand Exam     Treatment:     Right:   Fingers: Index, Long, Ring, and Small      Treatment Provided:    Orthosis:    Orthosis Fabricated: Forearm-Based -  Extension Wrist/Fingers    Purpose of Immobilization:To increase ROM    Orthosis Instructions:  Verbally instructed in orthosis/brace care and precautions. Verbally instructed in wearing schedule for orthosis/brace. At night and intermittently during the day until able to wear all night.    Assessment:    STG Goals:    Goal Number:  3  Goal: Patient to verbalize understanding of orthosis instructions including wearing schedule and precautions.  Goal Status:  Met      Patient's tolerance of treatment: Good    Plan:    Frequency of treatment:  To follow home orthosis program

## 2022-09-22 ENCOUNTER — Encounter: Admit: 2022-09-22 | Discharge: 2022-09-22 | Payer: BC Managed Care – PPO

## 2022-09-22 ENCOUNTER — Ambulatory Visit: Admit: 2022-09-22 | Discharge: 2022-09-23 | Payer: BC Managed Care – PPO

## 2022-09-22 DIAGNOSIS — Z720 Tobacco use: Secondary | ICD-10-CM

## 2022-09-22 DIAGNOSIS — N3010 Interstitial cystitis (chronic) without hematuria: Secondary | ICD-10-CM

## 2022-09-22 DIAGNOSIS — T753XXA Motion sickness, initial encounter: Secondary | ICD-10-CM

## 2022-09-22 DIAGNOSIS — E7800 Pure hypercholesterolemia, unspecified: Secondary | ICD-10-CM

## 2022-09-22 DIAGNOSIS — F99 Mental disorder, not otherwise specified: Secondary | ICD-10-CM

## 2022-09-22 DIAGNOSIS — J302 Other seasonal allergic rhinitis: Secondary | ICD-10-CM

## 2022-09-22 DIAGNOSIS — F32A Depression: Secondary | ICD-10-CM

## 2022-09-22 DIAGNOSIS — Z9981 Dependence on supplemental oxygen: Secondary | ICD-10-CM

## 2022-09-22 DIAGNOSIS — J449 Chronic obstructive pulmonary disease, unspecified: Secondary | ICD-10-CM

## 2022-09-22 DIAGNOSIS — T7840XA Allergy, unspecified, initial encounter: Secondary | ICD-10-CM

## 2022-09-22 DIAGNOSIS — M549 Dorsalgia, unspecified: Secondary | ICD-10-CM

## 2022-09-22 DIAGNOSIS — R42 Dizziness and giddiness: Secondary | ICD-10-CM

## 2022-09-22 DIAGNOSIS — I10 Essential (primary) hypertension: Secondary | ICD-10-CM

## 2022-09-22 DIAGNOSIS — R519 Generalized headaches: Secondary | ICD-10-CM

## 2022-09-22 DIAGNOSIS — G4733 Obstructive sleep apnea (adult) (pediatric): Secondary | ICD-10-CM

## 2022-09-22 DIAGNOSIS — G43909 Migraine, unspecified, not intractable, without status migrainosus: Secondary | ICD-10-CM

## 2022-09-22 DIAGNOSIS — G44039 Episodic paroxysmal hemicrania, not intractable: Secondary | ICD-10-CM

## 2022-09-22 DIAGNOSIS — F419 Anxiety disorder, unspecified: Secondary | ICD-10-CM

## 2022-09-22 DIAGNOSIS — R0600 Dyspnea, unspecified: Secondary | ICD-10-CM

## 2022-09-22 DIAGNOSIS — F341 Dysthymic disorder: Secondary | ICD-10-CM

## 2022-09-22 DIAGNOSIS — F4310 Post-traumatic stress disorder, unspecified: Secondary | ICD-10-CM

## 2022-09-22 DIAGNOSIS — K859 Acute pancreatitis without necrosis or infection, unspecified: Secondary | ICD-10-CM

## 2022-09-22 DIAGNOSIS — G5601 Carpal tunnel syndrome, right upper limb: Secondary | ICD-10-CM

## 2022-09-22 DIAGNOSIS — M1990 Unspecified osteoarthritis, unspecified site: Secondary | ICD-10-CM

## 2022-09-22 MED ORDER — SERTRALINE 25 MG PO TAB
25 mg | ORAL_TABLET | Freq: Every day | ORAL | 2 refills | Status: AC
Start: 2022-09-22 — End: ?

## 2022-09-22 MED ORDER — LAMOTRIGINE 25 MG PO TAB
25 mg | ORAL_TABLET | Freq: Every day | ORAL | 2 refills | Status: AC
Start: 2022-09-22 — End: ?

## 2022-09-22 MED ORDER — BUPROPION SR 100 MG PO TBSR
100 mg | Freq: Every day | ORAL | 1 refills | Status: AC
Start: 2022-09-22 — End: ?

## 2022-09-22 MED ORDER — SERTRALINE 50 MG PO TAB
50 mg | ORAL_TABLET | Freq: Every day | ORAL | 2 refills | Status: AC
Start: 2022-09-22 — End: ?

## 2022-09-22 NOTE — Progress Notes
ATTESTATION    I personally performed the key portions of the E/M visit, discussed case with resident and concur with resident documentation of history, physical exam, assessment, and treatment plan unless otherwise noted.    Staff name:  Charline Bills, DO Date: 09/22/2022
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
## 2022-09-22 NOTE — Patient Instructions
It was nice to see you in clinic today!    If you have any questions, you may reach the clinic by calling [phone] or, if you have MyChart access, can message me through your chart.    Brief Summary of today's visit:  For now, keep taking the Wellbutrin once a day.  For the next two weeks, take a bedtime dose of Depakote, only one tablet/capsule (250mg ).  For the next 30 days, you'll take lamotrigine in addition to these. After 30 days, send me a message or call to let me know how it's going and we'll go to the next dose.    We will see you back in about 2-3 months for follow up.    Please call the clinic at [phone] to reschedule or cancel appointments if a conflict with your schedule arises. For all appointments, please arrive 15 minutes prior to the start of your visit to facilitate rooming and other necessary check-in procedures. If you are not able to check-in on time, you may need to reschedule.    Please do not hesitate to call the clinic or contact us via MyChart if you have a change in your symptoms, if you have questions regarding your psychiatric medications, or if you start to develop side effects to medications prescribed by your psychiatrist. You can activate your MyChart account by visiting the website at [email or web address].     - Dr. Robb Matar    Our clinic is dedicated to making sure your prescriptions are filled in a timely manner during regular business hours (8am-5pm).  If you need a medication refill, please call your pharmacy to request a refill.  Please make sure to request refills at least 72 hours in advance of your last dose.  Your pharmacy will reach out to [place] electronically, which is the preferred method.  Please try to refrain from paging the on-call psychiatrist regarding medication refills (including controlled substances), as you may not receive a refill or a full refill at that time.    [facility] provides its patients with 24/7 care. If you are concerned about an impending psychiatric emergency (i.e. if you have any concerns about risk of harm to yourself, risk of harm to others or feel unsure about your ability to care for yourself), you may reach out to the overnight psychiatrist on-call by calling the main [facility] operator line outside of normal business hours ([phone]). If you are unsure if you are experiencing a psychiatric emergency, please ask the operator to page the on-call psychiatrist for clarification.    In the event of a safety concern or suicidal thoughts, call 911 or present to the nearest hospital emergency department. National Suicide Prevention Lifeline (820)832-1163 (TALK), or you may also dial 988. The Crisis Text Hotline (text 332-592-7347) is always available by SMS/Text. The [facility] phone line is a resource for talk support in non-emergency situations (1-866-WARMEAR).    If you have received a referral for therapy services through [facility], please request an intake packet at the soonest convenience to facilitate the intake process.

## 2022-09-23 DIAGNOSIS — F418 Other specified anxiety disorders: Secondary | ICD-10-CM

## 2022-09-26 ENCOUNTER — Encounter: Admit: 2022-09-26 | Discharge: 2022-09-26 | Payer: BC Managed Care – PPO

## 2022-09-26 DIAGNOSIS — I10 Essential (primary) hypertension: Secondary | ICD-10-CM

## 2022-09-26 MED ORDER — VERAPAMIL 40 MG PO TAB
ORAL_TABLET | 3 refills
Start: 2022-09-26 — End: ?

## 2022-12-06 ENCOUNTER — Encounter: Admit: 2022-12-06 | Discharge: 2022-12-06 | Payer: BC Managed Care – PPO

## 2022-12-06 NOTE — Patient Instructions
-   Preferred method of communication is through MyChart message, if the issue cannot wait until your next scheduled follow up.   -- MyChart may be used for non-emergent communication. Emails are not reviewed after hours or over the weekend/holidays/after 4PM. Staff will reply to your email within 24-48 business hours.       -- If you do not hear from us within one week of a lab or imaging study being completed, please call/send my chart email to the office to be sure that we have received the results. This is especially challenging when tests are done outside of the [facility] system, as many times results do not make it back to our office for a variety of reasons. In our office no news is good news does not apply. You should hear from us with results for each test.  [facility] lab/imaging results:  Due to the CARES act, results automatically release to MyChart.  Dr. Prabhakaran will continue to send you a result note on any labs that she orders.  With these changes you may see your results before Dr. Prabhakaran does.   Please allow up to 72 hours for review and response to your results.     -- If you are having acute (new/sudden onset) or severe/worsening neurologic symptoms, please call 911 or seek care in ED.    -- For scheduling of IMAGING/RADIOLOGY, please call [phone] at your convenience to schedule your studies.  -- For referrals placed during the visit, if you have not heard from scheduling within one week, please call the call center at [phone] to get scheduling assistance.  -- For refills on medications, please first contact your pharmacy, who will fax a refill authorization request form to our office.  Weekdays only. Allow up to 2 business days for refills. Please plan ahead, as refills will not be filled after hours.  -- Our scheduling staff, may be reached at [phone] for scheduling needs.   -- Your nurse Jeorge Reister, may be contacted at [phone] for urgent needs. Staff will return your call within 24 business hours.     For Appointments:   -- Please try to arrive early for your appointment time to help facilitate your visit. 15 minutes early is recommended.   -- If you are late to your appointment, we reserve the right to ask you to reschedule or wait until next available time to be seen in fairness to other patients scheduled that day.   -- There are times when we are running behind in clinic. Our goal is to always be on time, however, there are time when unexpected events occur with patients, which may cause a delay. We appreciate your understanding when this occurs.

## 2022-12-12 ENCOUNTER — Ambulatory Visit: Admit: 2022-12-12 | Discharge: 2022-12-12 | Payer: BC Managed Care – PPO

## 2022-12-12 ENCOUNTER — Encounter: Admit: 2022-12-12 | Discharge: 2022-12-12 | Payer: BC Managed Care – PPO

## 2022-12-12 DIAGNOSIS — G44039 Episodic paroxysmal hemicrania, not intractable: Secondary | ICD-10-CM

## 2022-12-12 DIAGNOSIS — R519 Generalized headaches: Secondary | ICD-10-CM

## 2022-12-12 DIAGNOSIS — M5431 Sciatica, right side: Secondary | ICD-10-CM

## 2022-12-12 DIAGNOSIS — M1990 Unspecified osteoarthritis, unspecified site: Secondary | ICD-10-CM

## 2022-12-12 DIAGNOSIS — T7840XA Allergy, unspecified, initial encounter: Secondary | ICD-10-CM

## 2022-12-12 DIAGNOSIS — E7800 Pure hypercholesterolemia, unspecified: Secondary | ICD-10-CM

## 2022-12-12 DIAGNOSIS — G43909 Migraine, unspecified, not intractable, without status migrainosus: Secondary | ICD-10-CM

## 2022-12-12 DIAGNOSIS — T753XXA Motion sickness, initial encounter: Secondary | ICD-10-CM

## 2022-12-12 DIAGNOSIS — G5601 Carpal tunnel syndrome, right upper limb: Secondary | ICD-10-CM

## 2022-12-12 DIAGNOSIS — J449 Chronic obstructive pulmonary disease, unspecified: Secondary | ICD-10-CM

## 2022-12-12 DIAGNOSIS — F419 Anxiety disorder, unspecified: Secondary | ICD-10-CM

## 2022-12-12 DIAGNOSIS — R42 Dizziness and giddiness: Secondary | ICD-10-CM

## 2022-12-12 DIAGNOSIS — R0600 Dyspnea, unspecified: Secondary | ICD-10-CM

## 2022-12-12 DIAGNOSIS — I10 Essential (primary) hypertension: Secondary | ICD-10-CM

## 2022-12-12 DIAGNOSIS — Z9981 Dependence on supplemental oxygen: Secondary | ICD-10-CM

## 2022-12-12 DIAGNOSIS — K859 Acute pancreatitis without necrosis or infection, unspecified: Secondary | ICD-10-CM

## 2022-12-12 DIAGNOSIS — H547 Unspecified visual loss: Secondary | ICD-10-CM

## 2022-12-12 DIAGNOSIS — G4733 Obstructive sleep apnea (adult) (pediatric): Secondary | ICD-10-CM

## 2022-12-12 DIAGNOSIS — M549 Dorsalgia, unspecified: Secondary | ICD-10-CM

## 2022-12-12 DIAGNOSIS — N3010 Interstitial cystitis (chronic) without hematuria: Secondary | ICD-10-CM

## 2022-12-12 DIAGNOSIS — F99 Mental disorder, not otherwise specified: Secondary | ICD-10-CM

## 2022-12-12 DIAGNOSIS — Z720 Tobacco use: Secondary | ICD-10-CM

## 2022-12-12 DIAGNOSIS — F32A Depression: Secondary | ICD-10-CM

## 2022-12-12 DIAGNOSIS — J302 Other seasonal allergic rhinitis: Secondary | ICD-10-CM

## 2022-12-12 MED ORDER — NORTRIPTYLINE 25 MG PO CAP
25 mg | ORAL_CAPSULE | Freq: Every evening | ORAL | 0 refills | Status: AC
Start: 2022-12-12 — End: ?

## 2022-12-12 MED ORDER — NORTRIPTYLINE 50 MG PO CAP
50 mg | ORAL_CAPSULE | Freq: Every evening | ORAL | 1 refills | Status: AC
Start: 2022-12-12 — End: ?

## 2022-12-12 MED ORDER — CLONAZEPAM 1 MG PO TAB
1 mg | ORAL_TABLET | Freq: Two times a day (BID) | ORAL | 5 refills | Status: AC
Start: 2022-12-12 — End: ?

## 2022-12-12 MED ORDER — SUMATRIPTAN SUCCINATE 100 MG PO TAB
ORAL_TABLET | SUBCUTANEOUS | 5 refills | 30.00000 days | Status: AC
Start: 2022-12-12 — End: ?

## 2022-12-12 MED ORDER — CLONAZEPAM 0.5 MG PO TAB
.5 mg | ORAL_TABLET | Freq: Every evening | ORAL | 5 refills | Status: AC
Start: 2022-12-12 — End: ?

## 2022-12-12 NOTE — Progress Notes
Date of Service: 12/12/2022    Subjective:             Christina Garrison is a 52 y.o. female.    History of Present Illness  Last visit on 06/13/2022    She reports occasional breakthrough migraines that occur ~3-4/month. Sumatriptan  is able to abort headaches but it takes a few hours to work.  She has been taking tylenol nightly for shoulder and hip pain after fall. She has noticed that small headaches have worsened since taking tylenol daily.     She is planned to to wean off of depakote and start lamotrigine per her psychiatrist. This is planned for next month.   She is not using BiPAP.  In the past when she used the BiPap she had noticed an improvement in headaches and somnolence.   She has seen an eye doctor who states that everything looked good.          Review of Systems   Constitutional:  Positive for diaphoresis.   HENT:  Positive for sinus pressure.    Eyes:  Positive for photophobia.   Respiratory:  Positive for apnea.    Endocrine: Positive for cold intolerance and heat intolerance.   Musculoskeletal:  Positive for neck stiffness.   Allergic/Immunologic: Positive for environmental allergies.   Neurological:  Positive for dizziness and headaches.   Psychiatric/Behavioral:  The patient is nervous/anxious.    All other systems reviewed and are negative.        Objective:          acetaminophen (TYLENOL EXTRA STRENGTH) 500 mg tablet Take one tablet by mouth every 6 hours as needed for Pain. Max of 4,000 mg of acetaminophen in 24 hours.    biotin 5,000 mcg TbDi Dissolve 2 tablets by mouth twice daily.    buPROPion HCL SR (WELLBUTRIN SR) 100 mg tablet, 12 hr sustained-release Take one tablet by mouth daily.    cholecalciferol (VITAMIN D-3) 1,000 units tablet Take one tablet by mouth twice weekly.    clonazePAM (KLONOPIN) 0.5 mg tablet Take one tablet by mouth at bedtime daily.    clonazePAM (KLONOPIN) 1 mg tablet Take one tablet by mouth twice daily.    cyanocobalamin (vitamin B-12) 2,500 mcg tab Take one tablet by mouth daily with breakfast.    diphenhydrAMINE HCL (BENADRYL) 12.5 mg/5 mL oral solution Take 5 mL by mouth every 6 hours as needed.    divalproex (DEPAKOTE) 250 mg tablet,delayed release TAKE ONE TABLET BY MOUTH TWICE DAILY. TAKE WITH FOOD.    ezetimibe (ZETIA) 10 mg tablet Take one tablet by mouth daily.    famotidine (PEPCID) 20 mg tablet Take one tablet by mouth daily as needed.    lamoTRIgine (LAMICTAL) 25 mg tablet Take one tablet by mouth daily.    multivit, Ca, min-FA-soy isofl 400-60 mcg-mg tab Take 1 tablet by mouth twice daily.    mupirocin (CENTANY) 2 % topical ointment Apply  topically to affected area twice daily.    nebivoloL (BYSTOLIC) 5 mg tablet TAKE 1 TABLET BY MOUTH DAILY    nicotine (NICOTROL NS) 10 mg/mL nasal spray Use 1 spray in each nostril 1-2 times per hour, not to exceed 40 times per day, for up to 12 weeks.    NICOTROL 10 mg inhaler INHALE ONE PUFF BY MOUTH INTO THE LUNGS AS NEEDED. PUFF BY MOUTH AS NEEDED. MAY USE 6-16 CARTRIDGES PER DAY AS NEEDED FOR UP TO 6 MONTHS. DO NOT SMOKE WHILE USING INHALER.  ondansetron (ZOFRAN ODT) 4 mg rapid dissolve tablet Dissolve one tablet by mouth every 8 hours as needed for Nausea or Vomiting. Place on tongue to dissolve.    pantoprazole DR (PROTONIX) 40 mg tablet TAKE 1 TABLET BY MOUTH EVERY DAY (Patient taking differently: Take one tablet by mouth as Needed.)    Potassium 99 mg tab Take one tablet by mouth three times weekly.    prochlorperazine maleate (COMPAZINE) 10 mg tablet Take one tablet by mouth every 6 hours as needed for Nausea or Vomiting.    riboflavin (vitamin B2) 400 mg tab Take one tablet by mouth daily.    rosuvastatin (CRESTOR) 20 mg tablet TAKE 1 TABLET BY MOUTH EVERY DAY    sertraline (ZOLOFT) 25 mg tablet Take one tablet by mouth daily. Please take with 50mg  tablets for total daily dose of 75mg     sertraline (ZOLOFT) 50 mg tablet Take one tablet by mouth daily. Please take with 25mg  tablet for total daily dose of 75mg  SUMAtriptan succinate (IMITREX) 100 mg tablet Take one tablet by mouth at onset of headache. May repeat after 2 hours if needed. Max of 200 mg in 24 hours.  Limit use to less than 10 tablets per month.    verapamil (CALAN) 40 mg tablet Take 1/2 tablet by mouth in the morning and 1 tablet in the evening    vitamins, multi w/minerals 9 mg iron-400 mcg tab Take one tablet by mouth daily with breakfast.     Vitals:    12/12/22 1056 12/12/22 1100   BP: (!) 115/100 130/89   BP Source:  Arm, Left Lower   Pulse: 61 61   PainSc: Four    Weight: 99.3 kg (219 lb)    Height: 165.1 cm (5' 5)      Body mass index is 36.44 kg/m?Marland Kitchen     Physical Exam    Neuro exam:   Mental status: alert, oriented to person/place/month/year     Speech:     Normal Abnormal   Fluency x     Comprehension x     Articulation x             Cranial Nerves:     Normal Abnormal   II PERRLA, visual fields intact bilaterally.sharp disc margins     III, IV, VI EOMI w/o nystagmus     V Intact to light touch in V1-V3 bilaterally     VII No facial asymmetry, orbicularis oculi and oris strength 5/5     VIII Gross hearing intact to conversation     IX, X Symmetric palatal elevation     XI Shoulder shrug 5/5 bilaterally     XII Tongue protrudes in midline          Muscle/motor:      Grade 0,1,2,3,4,5)  Neck flexors       Neck extensors         RIGHT LEFT   Shoulder abductors 5 5   Elbow flexors 5 5   Elbow extensors 5 5   Wrist extensors 5 5   Wrist flexors       Finger flexors Weak in 4th and 5th digit 5   Finger extensors       Finger abductors  4     Thumb abductors  4+  5   Hip flexors 5 5   Hip abductors       Hip extensors 5 5   Hip adductors       Knee flexors 5  5   Knee extensors 5 5   Ankle dorsiflexors 5 5   Ankle plantar flexors 5 5   Ankle inversion       Ankle eversion       Toe flexors       Toe extensors               Sensation:     Normal RUE LUE RLE LLE   Light Touch  Reduced in R ulnar distribution               Coordination:     Normal Abnormal Right Abnormal Left   Finger to Nose x       Rapid alternating  x       Heel to Shin x       Finger tap x       Foot tap x         Gait and Station:  Normal gait with good arm swing         Assessment and Plan:  Christina Garrison is a 52 y.o. female with a PMH significant for HTN, HLD, vertigo on clonazepam, emphysema, obesity s/p gastric sleeve, OSA on BiPAP, anxiety, PTSD, occipital neuralgia, tension headaches, and migraine, who presents for follow up of her headaches.     Chronic Migraine with Aura  Occipital Neuralgia  - Current frequency: 2-3 severe migraines per month  Typical migraine-  Holoacranial, throbbing, associated with photo and phonophobia and nausea, lasting a few hours. Usually controlled with sumatriptan 100mg , benadryl and compazine cocktail.   Current frequency-  Increased in the past 1.59months to x1/week. In the past occurred x1-2/month.  Stress in past month (sister passed away and fractures R elbow)  Trigger-sunlight  Has been waking up with headaches in the past few months  - She has previously tried topiramate and had intolerable side effects.  She is not an ideal candidate for a beta blocker due to her relative hypotension in clinic today and her history of emphysema.  She has had an adverse reaction to venlafaxine in the past.  - couldn't tolerate tricyclic due to sedation  - overall migraines have been stable, pt not wanting to try something new at this time    Plan:     > Preventive:               Psychiatry plans to discontinue  Depakote 750 mg DR and transition to lamotrigine for mood stabilization    Start on nortriptyline 25mg  qhs x 1 week followed by 50mg  to continue               Continue magnesium 400 mg and riboflavin 400 mg daily    Will consider a CGRP antagonist during next visit if breakthrough headaches continue to occur >x1/wk  > Abortive therapy: continue sumatriptan 100mg  PRN along with benadryl and compazine  > Contributing factors to headache:         Recommend f/up with pulmonary for more consistent BiPaP use or nightly O2 use         Discussed about medication overuse headache and recommend to only use NSAIDS/tylenol <10 days/month    R Ulnar Neuropathy  R Carpel Tunnel  R Sciatica  - Neuro exam with normal strength, reflexes and sensory exam in b/l LE  - 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  Plan:  > Continue clonazepam 0.75 mg BID and 1 mg daily PRN       Pt seen and discussed with Dr. Genevie Cheshire.          Almyra Deforest, MBBS  Neurology PGY4 Resident

## 2022-12-19 ENCOUNTER — Encounter: Admit: 2022-12-19 | Discharge: 2022-12-19 | Payer: BC Managed Care – PPO

## 2022-12-19 MED ORDER — BUPROPION SR 100 MG PO TBSR
100 mg | Freq: Two times a day (BID) | ORAL | 1 refills | Status: AC
Start: 2022-12-19 — End: ?

## 2022-12-20 ENCOUNTER — Encounter: Admit: 2022-12-20 | Discharge: 2022-12-20 | Payer: BC Managed Care – PPO

## 2022-12-20 DIAGNOSIS — F4310 Post-traumatic stress disorder, unspecified: Secondary | ICD-10-CM

## 2022-12-20 DIAGNOSIS — F418 Other specified anxiety disorders: Secondary | ICD-10-CM

## 2022-12-20 DIAGNOSIS — F341 Dysthymic disorder: Secondary | ICD-10-CM

## 2022-12-20 MED ORDER — DIVALPROEX 250 MG PO TBEC
250 mg | ORAL_TABLET | Freq: Two times a day (BID) | ORAL | 0 refills
Start: 2022-12-20 — End: ?

## 2022-12-20 MED ORDER — LAMOTRIGINE 25 MG PO TAB
25 mg | ORAL_TABLET | Freq: Every day | ORAL | 0 refills
Start: 2022-12-20 — End: ?

## 2022-12-22 ENCOUNTER — Encounter: Admit: 2022-12-22 | Discharge: 2022-12-22 | Payer: BC Managed Care – PPO

## 2022-12-22 MED ORDER — CLONAZEPAM 1 MG PO TAB
1 mg | ORAL_TABLET | Freq: Two times a day (BID) | ORAL | 0 refills
Start: 2022-12-22 — End: ?

## 2022-12-26 ENCOUNTER — Encounter: Admit: 2022-12-26 | Discharge: 2022-12-26 | Payer: BC Managed Care – PPO

## 2023-01-13 ENCOUNTER — Ambulatory Visit: Admit: 2023-01-13 | Discharge: 2023-01-14 | Payer: BC Managed Care – PPO

## 2023-01-13 ENCOUNTER — Encounter: Admit: 2023-01-13 | Discharge: 2023-01-13 | Payer: BC Managed Care – PPO

## 2023-01-13 DIAGNOSIS — N3010 Interstitial cystitis (chronic) without hematuria: Secondary | ICD-10-CM

## 2023-01-13 DIAGNOSIS — M1990 Unspecified osteoarthritis, unspecified site: Secondary | ICD-10-CM

## 2023-01-13 DIAGNOSIS — G44039 Episodic paroxysmal hemicrania, not intractable: Secondary | ICD-10-CM

## 2023-01-13 DIAGNOSIS — Z9981 Dependence on supplemental oxygen: Secondary | ICD-10-CM

## 2023-01-13 DIAGNOSIS — H547 Unspecified visual loss: Secondary | ICD-10-CM

## 2023-01-13 DIAGNOSIS — R519 Generalized headaches: Secondary | ICD-10-CM

## 2023-01-13 DIAGNOSIS — E7800 Pure hypercholesterolemia, unspecified: Secondary | ICD-10-CM

## 2023-01-13 DIAGNOSIS — G43909 Migraine, unspecified, not intractable, without status migrainosus: Secondary | ICD-10-CM

## 2023-01-13 DIAGNOSIS — I10 Essential (primary) hypertension: Secondary | ICD-10-CM

## 2023-01-13 DIAGNOSIS — G4733 Obstructive sleep apnea (adult) (pediatric): Secondary | ICD-10-CM

## 2023-01-13 DIAGNOSIS — F5104 Psychophysiologic insomnia: Secondary | ICD-10-CM

## 2023-01-13 DIAGNOSIS — F99 Mental disorder, not otherwise specified: Secondary | ICD-10-CM

## 2023-01-13 DIAGNOSIS — T7840XA Allergy, unspecified, initial encounter: Secondary | ICD-10-CM

## 2023-01-13 DIAGNOSIS — F341 Dysthymic disorder: Secondary | ICD-10-CM

## 2023-01-13 DIAGNOSIS — K859 Acute pancreatitis without necrosis or infection, unspecified: Secondary | ICD-10-CM

## 2023-01-13 DIAGNOSIS — R42 Dizziness and giddiness: Secondary | ICD-10-CM

## 2023-01-13 DIAGNOSIS — Z720 Tobacco use: Secondary | ICD-10-CM

## 2023-01-13 DIAGNOSIS — G5601 Carpal tunnel syndrome, right upper limb: Secondary | ICD-10-CM

## 2023-01-13 DIAGNOSIS — M549 Dorsalgia, unspecified: Secondary | ICD-10-CM

## 2023-01-13 DIAGNOSIS — J302 Other seasonal allergic rhinitis: Secondary | ICD-10-CM

## 2023-01-13 DIAGNOSIS — R0600 Dyspnea, unspecified: Secondary | ICD-10-CM

## 2023-01-13 DIAGNOSIS — F419 Anxiety disorder, unspecified: Secondary | ICD-10-CM

## 2023-01-13 DIAGNOSIS — J449 Chronic obstructive pulmonary disease, unspecified: Secondary | ICD-10-CM

## 2023-01-13 DIAGNOSIS — T753XXA Motion sickness, initial encounter: Secondary | ICD-10-CM

## 2023-01-13 DIAGNOSIS — F32A Depression: Secondary | ICD-10-CM

## 2023-01-13 DIAGNOSIS — F4310 Post-traumatic stress disorder, unspecified: Secondary | ICD-10-CM

## 2023-01-13 MED ORDER — LAMOTRIGINE 25 MG PO TAB
50 mg | ORAL_TABLET | Freq: Every evening | ORAL | 2 refills | Status: AC
Start: 2023-01-13 — End: ?

## 2023-01-13 NOTE — Progress Notes
ATTESTATION    I discussed the key portions of the E/M visit, with the resident and concur with resident documentation of history, physical exam, assessment, and treatment plan unless otherwise noted.    Staff name:  Amyiah Gaba N Phoenyx Paulsen, MD Date:  01/13/2023

## 2023-01-13 NOTE — Patient Instructions
It was nice to see you in clinic today!    If you have any questions, you may reach the clinic by calling [phone] or, if you have MyChart access, can message me through your chart.    Brief Summary of today's visit:  Increase lamotrigine to 50mg  at bedtime. Let me know if there's ever a rash you notice and I will walk you through the next steps if it happens.  No other changes.    We will see you back in about 3 months for follow up.    Please call the clinic at [phone] to reschedule or cancel appointments if a conflict with your schedule arises. For all appointments, please arrive 15 minutes prior to the start of your visit to facilitate rooming and other necessary check-in procedures. If you are not able to check-in on time, you may need to reschedule.    Please do not hesitate to call the clinic or contact us via MyChart if you have a change in your symptoms, if you have questions regarding your psychiatric medications, or if you start to develop side effects to medications prescribed by your psychiatrist. You can activate your MyChart account by visiting the website at [email or web address].     - Dr. Robb Matar    Our clinic is dedicated to making sure your prescriptions are filled in a timely manner during regular business hours (8am-5pm).  If you need a medication refill, please call your pharmacy to request a refill.  Please make sure to request refills at least 72 hours in advance of your last dose.  Your pharmacy will reach out to [place] electronically, which is the preferred method.  Please try to refrain from paging the on-call psychiatrist regarding medication refills (including controlled substances), as you may not receive a refill or a full refill at that time.    [facility] provides its patients with 24/7 care. If you are concerned about an impending psychiatric emergency (i.e. if you have any concerns about risk of harm to yourself, risk of harm to others or feel unsure about your ability to care for yourself), you may reach out to the overnight psychiatrist on-call by calling the main [facility] operator line outside of normal business hours ([phone]). If you are unsure if you are experiencing a psychiatric emergency, please ask the operator to page the on-call psychiatrist for clarification.    In the event of a safety concern or suicidal thoughts, call 911 or present to the nearest hospital emergency department. National Suicide Prevention Lifeline 228-462-4234 (TALK), or you may also dial 988. The Crisis Text Hotline (text (229)794-9248) is always available by SMS/Text. The [facility] phone line is a resource for talk support in non-emergency situations (1-866-WARMEAR).    If you have received a referral for therapy services through [facility], please request an intake packet at the soonest convenience to facilitate the intake process.
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
## 2023-02-04 ENCOUNTER — Encounter: Admit: 2023-02-04 | Discharge: 2023-02-04 | Payer: BC Managed Care – PPO

## 2023-02-04 ENCOUNTER — Ambulatory Visit: Admit: 2023-02-04 | Discharge: 2023-02-04 | Payer: BC Managed Care – PPO

## 2023-02-04 DIAGNOSIS — G44039 Episodic paroxysmal hemicrania, not intractable: Secondary | ICD-10-CM

## 2023-02-04 DIAGNOSIS — R42 Dizziness and giddiness: Secondary | ICD-10-CM

## 2023-02-04 DIAGNOSIS — F419 Anxiety disorder, unspecified: Secondary | ICD-10-CM

## 2023-02-04 DIAGNOSIS — J449 Chronic obstructive pulmonary disease, unspecified: Secondary | ICD-10-CM

## 2023-02-04 DIAGNOSIS — F99 Mental disorder, not otherwise specified: Secondary | ICD-10-CM

## 2023-02-04 DIAGNOSIS — H547 Unspecified visual loss: Secondary | ICD-10-CM

## 2023-02-04 DIAGNOSIS — G43909 Migraine, unspecified, not intractable, without status migrainosus: Secondary | ICD-10-CM

## 2023-02-04 DIAGNOSIS — K859 Acute pancreatitis without necrosis or infection, unspecified: Secondary | ICD-10-CM

## 2023-02-04 DIAGNOSIS — J439 Emphysema, unspecified: Secondary | ICD-10-CM

## 2023-02-04 DIAGNOSIS — R519 Generalized headaches: Secondary | ICD-10-CM

## 2023-02-04 DIAGNOSIS — F32A Depression: Secondary | ICD-10-CM

## 2023-02-04 DIAGNOSIS — T7840XA Allergy, unspecified, initial encounter: Secondary | ICD-10-CM

## 2023-02-04 DIAGNOSIS — E7800 Pure hypercholesterolemia, unspecified: Secondary | ICD-10-CM

## 2023-02-04 DIAGNOSIS — T753XXA Motion sickness, initial encounter: Secondary | ICD-10-CM

## 2023-02-04 DIAGNOSIS — R0600 Dyspnea, unspecified: Secondary | ICD-10-CM

## 2023-02-04 DIAGNOSIS — I10 Essential (primary) hypertension: Secondary | ICD-10-CM

## 2023-02-04 DIAGNOSIS — M549 Dorsalgia, unspecified: Secondary | ICD-10-CM

## 2023-02-04 DIAGNOSIS — Z720 Tobacco use: Secondary | ICD-10-CM

## 2023-02-04 DIAGNOSIS — Z9981 Dependence on supplemental oxygen: Secondary | ICD-10-CM

## 2023-02-04 DIAGNOSIS — G4733 Obstructive sleep apnea (adult) (pediatric): Secondary | ICD-10-CM

## 2023-02-04 DIAGNOSIS — N3010 Interstitial cystitis (chronic) without hematuria: Secondary | ICD-10-CM

## 2023-02-04 DIAGNOSIS — M1990 Unspecified osteoarthritis, unspecified site: Secondary | ICD-10-CM

## 2023-02-04 DIAGNOSIS — G5601 Carpal tunnel syndrome, right upper limb: Secondary | ICD-10-CM

## 2023-02-04 DIAGNOSIS — J302 Other seasonal allergic rhinitis: Secondary | ICD-10-CM

## 2023-02-04 NOTE — Progress Notes
Date of Service: 02/04/2023    Subjective:          Christina Garrison is a 52 y.o. female emphysema, mild OSA on Bi-PAP (AHI 12.3/hr) , chronic headaches w/ occipital neuralgia, obesity s/p gastric sleeve 2015 (complicated by bilateral pleural effusions and extended hospitalization), GERD, arthritis, former tobacco use, who presents to clinic for Bi-PAP compliance update. She was last seen in 01/2022.    Today she reports she has not been wearing her Bi-PAP because she was not able to get supplies from the DME company, states that she was told that they needed orders from her physician for that, states that she did not try to call/send a message to our clinic to assist in that.  She has not been using her BiPAP for the last 6 months now, previously she reported compliance with PAP therapy along with improvement in her sleep quality and daytime sleepiness.    She otherwise denies any respiratory related symptoms, denies shortness of breath or cough.  She is currently off nicotine replacement therapy and is no longer vaping. She has a 45 pack year smoking history and quit smoking in 2013.      Review of Systems   Constitutional:  Negative for activity change, appetite change, chills, diaphoresis, fatigue and fever.   HENT:  Negative for congestion, postnasal drip, rhinorrhea, sinus pressure and sinus pain.    Eyes: Negative.    Respiratory:  Positive for apnea. Negative for cough, shortness of breath, wheezing and stridor.    Cardiovascular:  Negative for chest pain, palpitations and leg swelling.   Gastrointestinal:  Negative for abdominal distention, abdominal pain, blood in stool and constipation.   Endocrine: Negative.    Genitourinary:  Negative for difficulty urinating, dysuria, frequency and hematuria.   Musculoskeletal:  Negative for arthralgias, gait problem, joint swelling and myalgias.   Skin:  Negative for color change and rash.   Allergic/Immunologic: Negative for environmental allergies and food allergies. Neurological:  Negative for dizziness, syncope, light-headedness and headaches.   Psychiatric/Behavioral:  Negative for agitation, confusion, decreased concentration and dysphoric mood. The patient is not nervous/anxious.    All other systems reviewed and are negative.      Objective:          acetaminophen (TYLENOL EXTRA STRENGTH) 500 mg tablet Take one tablet by mouth every 6 hours as needed for Pain. Max of 4,000 mg of acetaminophen in 24 hours.    biotin 5,000 mcg TbDi Dissolve 2 tablets by mouth twice daily.    buPROPion HCL SR (WELLBUTRIN SR) 100 mg tablet, 12 hr sustained-release TAKE 1 TABLET BY MOUTH TWICE A DAY    cholecalciferol (VITAMIN D-3) 1,000 units tablet Take one tablet by mouth twice weekly.    clonazePAM (KLONOPIN) 1 mg tablet TAKE 1 TABLET BY MOUTH TWICE A DAY    cyanocobalamin (vitamin B-12) 2,500 mcg tab Take one tablet by mouth daily with breakfast.    diphenhydrAMINE HCL (BENADRYL) 12.5 mg/5 mL oral solution Take 5 mL by mouth every 6 hours as needed.    ezetimibe (ZETIA) 10 mg tablet Take one tablet by mouth daily.    famotidine (PEPCID) 20 mg tablet Take one tablet by mouth daily as needed.    lamoTRIgine (LAMICTAL) 25 mg tablet Take two tablets by mouth at bedtime daily.    multivit, Ca, min-FA-soy isofl 400-60 mcg-mg tab Take 1 tablet by mouth twice daily.    mupirocin (CENTANY) 2 % topical ointment Apply  topically to affected  area twice daily.    nebivoloL (BYSTOLIC) 5 mg tablet TAKE 1 TABLET BY MOUTH DAILY    nicotine (NICOTROL NS) 10 mg/mL nasal spray Use 1 spray in each nostril 1-2 times per hour, not to exceed 40 times per day, for up to 12 weeks.    NICOTROL 10 mg inhaler INHALE ONE PUFF BY MOUTH INTO THE LUNGS AS NEEDED. PUFF BY MOUTH AS NEEDED. MAY USE 6-16 CARTRIDGES PER DAY AS NEEDED FOR UP TO 6 MONTHS. DO NOT SMOKE WHILE USING INHALER.    nortriptyline (PAMELOR) 50 mg capsule Take one capsule by mouth at bedtime daily.    ondansetron (ZOFRAN ODT) 4 mg rapid dissolve tablet Dissolve one tablet by mouth every 8 hours as needed for Nausea or Vomiting. Place on tongue to dissolve.    pantoprazole DR (PROTONIX) 40 mg tablet TAKE 1 TABLET BY MOUTH EVERY DAY (Patient taking differently: Take one tablet by mouth as Needed.)    Potassium 99 mg tab Take one tablet by mouth three times weekly.    prochlorperazine maleate (COMPAZINE) 10 mg tablet Take one tablet by mouth every 6 hours as needed for Nausea or Vomiting.    riboflavin (vitamin B2) 400 mg tab Take one tablet by mouth daily.    rosuvastatin (CRESTOR) 20 mg tablet TAKE 1 TABLET BY MOUTH EVERY DAY    sertraline (ZOLOFT) 25 mg tablet Take one tablet by mouth daily. Please take with 50mg  tablets for total daily dose of 75mg     sertraline (ZOLOFT) 50 mg tablet Take one tablet by mouth daily. Please take with 25mg  tablet for total daily dose of 75mg     SUMAtriptan succinate (IMITREX) 100 mg tablet Take one tablet by mouth at onset of headache. May repeat after 2 hours if needed. Max of 200 mg in 24 hours.  Limit use to less than 10 tablets per month.    verapamil (CALAN) 40 mg tablet Take 1/2 tablet by mouth in the morning and 1 tablet in the evening    vitamins, multi w/minerals 9 mg iron-400 mcg tab Take one tablet by mouth daily with breakfast.       Vitals:    02/04/23 1523   BP: 123/74   BP Source: Arm, Left Upper   Pulse: 75   Temp: 36.6 ?C (97.8 ?F)   Resp: 18   SpO2: 95%   TempSrc: Oral   PainSc: Zero   Weight: 100.7 kg (222 lb)   Height: 165.1 cm (5' 5)     Body mass index is 36.94 kg/m?Marland Kitchen     Physical Exam  Vitals and nursing note reviewed.   Constitutional:       General: She is not in acute distress.     Appearance: Normal appearance. She is obese. She is not ill-appearing or toxic-appearing.   HENT:      Head: Normocephalic and atraumatic.      Right Ear: External ear normal.      Left Ear: External ear normal.      Nose: Nose normal.   Eyes:      Extraocular Movements: Extraocular movements intact.      Pupils: Pupils are equal, round, and reactive to light.   Cardiovascular:      Rate and Rhythm: Normal rate and regular rhythm.      Pulses: Normal pulses.      Heart sounds: Normal heart sounds. No murmur heard.     No friction rub. No gallop.   Pulmonary:  Effort: Pulmonary effort is normal. No respiratory distress.      Breath sounds: Normal breath sounds. No stridor. No wheezing, rhonchi or rales.   Musculoskeletal:         General: Normal range of motion.      Cervical back: No muscular tenderness.      Right lower leg: No edema.      Left lower leg: No edema.   Skin:     General: Skin is warm and dry.      Capillary Refill: Capillary refill takes less than 2 seconds.      Findings: No rash.   Neurological:      General: No focal deficit present.      Mental Status: She is alert and oriented to person, place, and time. Mental status is at baseline.      Cranial Nerves: No cranial nerve deficit.   Psychiatric:         Mood and Affect: Mood normal.         Behavior: Behavior normal.       Labs:  Abs eos 170 (06/09/19)  CO2 28 (06/09/19)    Imaging:  CT Chest 10/07/18:  1.  No mediastinal mass, lymphadenopathy or fluid collection. Changes of   gastric sleeve are noted.   2.  Mild emphysema.   3.  There are 2 tiny nodules along the left major fissure which likely   represent granulomas or intrapulmonary lymph nodes. If there are   significant risk factors for pulmonary malignancy, a follow-up chest CT in   12 months could be obtained to assess for stability.     HST 08/2019:      PFTs:   07/11/19  FVC 3.19L/92%  SVC 3.36L/97%  FEV1 2.63L/95%  Ratio 78%/ LLN 70%  RV 97%  TLC 98%  DLCO 94%  Normal pulmonary function tests    Assessment & Plan:  DYUTHI EUSTACHE is a 52 y.o. female with radiographic emphysema, OSA on Bi-PAP, chronic headaches w/ occipital neuralgia, obesity s/p gastric sleeve 2015 (complicated by bilateral pleural effusions and extended hospitalization), GERD, arthritis, former nicotine use now in remission, who presents to clinic for Bi-PAP compliance update.    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    She has been off her Bi-PAP due to inability to get supplies, but her DME company [employer]) she was told that she needed orders from her prescribing physician.   We will assess in sending her prescription orders to get supplies, patient is willing to resume using her BiPAP.    2- Centrilobular emphysema w/o obstruction  Mild emphysema on prior imaging, no obstruction on PFTs.  Not currently on any inhalers and no significant dyspnea.    3- Former tobacco use: enrolled in lung cancer screening program      Follow-up in 12 months with Alean Rinne NP for OSA follow up.     Patient seen and evaluated with Dr. Ranae Plumber.    Marlowe Alt, MBBS

## 2023-02-04 NOTE — Patient Instructions
It was a pleasure seeing you today.      My nurse is Kendra Sewell, RN.  She can be reached at [phone].    Please contact my nurse with any signs and symptoms of worsening productive cough with thick secretions, blood in sputum, chest pain or tightness, shortness of breath, fever, chills, night sweats, or any questions or concerns.    For refills on medications, please have your pharmacy request a refill authorization either electronically or via fax to our office at [phone]. Please allow at least 3 business days for refill requests.    For urgent issues after business hours, weekends, or holidays please call [phone] and request for the pulmonary fellow to be paged.    If you need to cancel or reschedule an appointment, please call [phone].

## 2023-02-07 ENCOUNTER — Encounter: Admit: 2023-02-07 | Discharge: 2023-02-07 | Payer: BC Managed Care – PPO

## 2023-02-23 ENCOUNTER — Encounter: Admit: 2023-02-23 | Discharge: 2023-02-23 | Payer: BC Managed Care – PPO

## 2023-02-23 MED ORDER — CLONAZEPAM 0.5 MG PO TAB
0.5 mg | ORAL_TABLET | Freq: Every evening | ORAL | 0 refills
Start: 2023-02-23 — End: ?

## 2023-03-02 ENCOUNTER — Encounter: Admit: 2023-03-02 | Discharge: 2023-03-02 | Payer: BC Managed Care – PPO

## 2023-03-21 ENCOUNTER — Ambulatory Visit: Admit: 2023-03-21 | Discharge: 2023-03-21 | Payer: BC Managed Care – PPO

## 2023-03-21 ENCOUNTER — Encounter: Admit: 2023-03-21 | Discharge: 2023-03-21 | Payer: BC Managed Care – PPO

## 2023-03-21 DIAGNOSIS — M5431 Sciatica, right side: Secondary | ICD-10-CM

## 2023-04-06 ENCOUNTER — Encounter: Admit: 2023-04-06 | Discharge: 2023-04-06 | Payer: BC Managed Care – PPO

## 2023-04-16 ENCOUNTER — Encounter: Admit: 2023-04-16 | Discharge: 2023-04-16 | Payer: BC Managed Care – PPO

## 2023-04-16 ENCOUNTER — Ambulatory Visit: Admit: 2023-04-16 | Discharge: 2023-04-17 | Payer: BC Managed Care – PPO

## 2023-04-16 DIAGNOSIS — F418 Other specified anxiety disorders: Secondary | ICD-10-CM

## 2023-04-16 DIAGNOSIS — F4310 Post-traumatic stress disorder, unspecified: Secondary | ICD-10-CM

## 2023-04-16 DIAGNOSIS — F341 Dysthymic disorder: Secondary | ICD-10-CM

## 2023-04-16 DIAGNOSIS — F5104 Psychophysiologic insomnia: Secondary | ICD-10-CM

## 2023-04-16 NOTE — Progress Notes
ATTENDING NOTE  I discussed Christina Garrison with Christina Labella, MD and concur with the assessment and treatment plan. Patient is 52 y.o. female with Persistent depressive disorder, PTSD, Other specified anxiety, cluster b traits. Pt reports increased symptoms of anxiety. Denies SI/HI and AVH and no other safety concerns.     PLAN:  The following medication changes were made during this visit to better treat the above symptoms:  Increase Zoloft to 100mg  Qday for mood.   Change Wellbutrin SR to 100mg  Qday as this is what she is taking  Continue Lamictal 25mg  QHS for now however if she ultimately does not tolerate higher doses would discontinue.   Prescribed by neurology: Nortriptyline 50mg  QHS (she is taking it PRN for migraines) and Klonopin 0.75 BID and 1mg  PRN for vertigo   No labs needed     acetaminophen (TYLENOL EXTRA STRENGTH) 500 mg tablet Take one tablet by mouth every 6 hours as needed for Pain. Max of 4,000 mg of acetaminophen in 24 hours.    biotin 5,000 mcg TbDi Dissolve 2 tablets by mouth twice daily.    buPROPion HCL SR (WELLBUTRIN SR) 100 mg tablet, 12 hr sustained-release TAKE 1 TABLET BY MOUTH TWICE A DAY    cholecalciferol (VITAMIN D-3) 1,000 units tablet Take one tablet by mouth twice weekly.    clonazePAM (KLONOPIN) 0.5 mg tablet TAKE 1 TABLET BY MOUTH EVERYDAY AT BEDTIME    cyanocobalamin (vitamin B-12) 2,500 mcg tab Take one tablet by mouth daily with breakfast.    diphenhydrAMINE HCL (BENADRYL) 12.5 mg/5 mL oral solution Take 5 mL by mouth every 6 hours as needed.    ezetimibe (ZETIA) 10 mg tablet Take one tablet by mouth daily.    famotidine (PEPCID) 20 mg tablet Take one tablet by mouth daily as needed.    lamoTRIgine (LAMICTAL) 25 mg tablet Take two tablets by mouth at bedtime daily.    multivit, Ca, min-FA-soy isofl 400-60 mcg-mg tab Take 1 tablet by mouth twice daily.    mupirocin (CENTANY) 2 % topical ointment Apply  topically to affected area twice daily.    nebivoloL (BYSTOLIC) 5 mg tablet TAKE 1 TABLET BY MOUTH DAILY    nicotine (NICOTROL NS) 10 mg/mL nasal spray Use 1 spray in each nostril 1-2 times per hour, not to exceed 40 times per day, for up to 12 weeks.    NICOTROL 10 mg inhaler INHALE ONE PUFF BY MOUTH INTO THE LUNGS AS NEEDED. PUFF BY MOUTH AS NEEDED. MAY USE 6-16 CARTRIDGES PER DAY AS NEEDED FOR UP TO 6 MONTHS. DO NOT SMOKE WHILE USING INHALER.    nortriptyline (PAMELOR) 50 mg capsule Take one capsule by mouth at bedtime daily.    ondansetron (ZOFRAN ODT) 4 mg rapid dissolve tablet Dissolve one tablet by mouth every 8 hours as needed for Nausea or Vomiting. Place on tongue to dissolve.    pantoprazole DR (PROTONIX) 40 mg tablet TAKE 1 TABLET BY MOUTH EVERY DAY (Patient taking differently: Take one tablet by mouth as Needed.)    Potassium 99 mg tab Take one tablet by mouth three times weekly.    prochlorperazine maleate (COMPAZINE) 10 mg tablet Take one tablet by mouth every 6 hours as needed for Nausea or Vomiting.    riboflavin (vitamin B2) 400 mg tab Take one tablet by mouth daily.    rosuvastatin (CRESTOR) 20 mg tablet TAKE 1 TABLET BY MOUTH EVERY DAY    sertraline (ZOLOFT) 25 mg tablet Take one tablet by mouth  daily. Please take with 50mg  tablets for total daily dose of 75mg     sertraline (ZOLOFT) 50 mg tablet Take one tablet by mouth daily. Please take with 25mg  tablet for total daily dose of 75mg     SUMAtriptan succinate (IMITREX) 100 mg tablet Take one tablet by mouth at onset of headache. May repeat after 2 hours if needed. Max of 200 mg in 24 hours.  Limit use to less than 10 tablets per month.    verapamil (CALAN) 40 mg tablet Take 1/2 tablet by mouth in the morning and 1 tablet in the evening    vitamins, multi w/minerals 9 mg iron-400 mcg tab Take one tablet by mouth daily with breakfast.       Christina Pagan, DO  04/16/2023

## 2023-04-16 NOTE — Patient Instructions
It was nice to see you in clinic today!    If you have any questions, you may reach the clinic by calling [phone] or, if you have MyChart access, can message me through your chart.    Brief Summary of today's visit:  Increase Sertraline 100mg  for mood, anxiety    We will see you back in about 8 weeks for follow up.    Please call the clinic at [phone] to reschedule or cancel appointments if a conflict with your schedule arises. For all appointments, please arrive 15 minutes prior to the start of your visit to facilitate rooming and other necessary check-in procedures. If you are not able to check-in on time, you may need to reschedule.    Please do not hesitate to call the clinic or contact us via MyChart if you have a change in your symptoms, if you have questions regarding your psychiatric medications, or if you start to develop side effects to medications prescribed by your psychiatrist. You can activate your MyChart account by visiting the website at [email or web address].     Dr. Marc Morgans   [facility] Resident, PGY-3    Our clinic is dedicated to making sure your prescriptions are filled in a timely manner during regular business hours (8am-5pm).  If you need a medication refill, please call your pharmacy to request a refill.  Please make sure to request refills at least 72 hours in advance of your last dose.  Your pharmacy will reach out to [place] electronically, which is the preferred method.  Please try to refrain from paging the on-call psychiatrist regarding medication refills (including controlled substances), as you may not receive a refill or a full refill at that time.    [facility] provides its patients with 24/7 care. If you are concerned about an impending psychiatric emergency (i.e. if you have any concerns about risk of harm to yourself, risk of harm to others or feel unsure about your ability to care for yourself), you may reach out to the overnight psychiatrist on-call by calling the main [facility] operator line outside of normal business hours ([phone]). If you are unsure if you are experiencing a psychiatric emergency, please ask the operator to page the on-call psychiatrist for clarification.    In the event of a safety concern or suicidal thoughts, call 911 or present to the nearest hospital emergency department. National Suicide Prevention Lifeline (704) 156-2374 (TALK), or you may also dial 988. The Crisis Text Hotline (text (904) 666-3900) is always available by SMS/Text. The [facility] [employer] phone line is a resource for talk support in non-emergency situations (1-866-WARMEAR).    If you have received a referral for therapy services through [facility], please request an intake packet at the soonest convenience to facilitate the intake process.
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
## 2023-06-12 ENCOUNTER — Encounter: Admit: 2023-06-12 | Discharge: 2023-06-12 | Payer: BC Managed Care – PPO

## 2023-06-12 ENCOUNTER — Ambulatory Visit: Admit: 2023-06-12 | Discharge: 2023-06-13 | Payer: BC Managed Care – PPO

## 2023-06-12 DIAGNOSIS — Z720 Tobacco use: Secondary | ICD-10-CM

## 2023-06-12 DIAGNOSIS — G44039 Episodic paroxysmal hemicrania, not intractable: Secondary | ICD-10-CM

## 2023-06-12 DIAGNOSIS — G43009 Migraine without aura, not intractable, without status migrainosus: Secondary | ICD-10-CM

## 2023-06-12 DIAGNOSIS — M5481 Occipital neuralgia: Secondary | ICD-10-CM

## 2023-06-12 DIAGNOSIS — G4733 Obstructive sleep apnea (adult) (pediatric): Secondary | ICD-10-CM

## 2023-06-12 DIAGNOSIS — Z9981 Dependence on supplemental oxygen: Secondary | ICD-10-CM

## 2023-06-12 DIAGNOSIS — T7840XA Allergy, unspecified, initial encounter: Secondary | ICD-10-CM

## 2023-06-12 DIAGNOSIS — M5431 Sciatica, right side: Secondary | ICD-10-CM

## 2023-06-12 DIAGNOSIS — R42 Dizziness and giddiness: Secondary | ICD-10-CM

## 2023-06-12 DIAGNOSIS — M1990 Unspecified osteoarthritis, unspecified site: Secondary | ICD-10-CM

## 2023-06-12 DIAGNOSIS — F419 Anxiety disorder, unspecified: Secondary | ICD-10-CM

## 2023-06-12 DIAGNOSIS — R0600 Dyspnea, unspecified: Secondary | ICD-10-CM

## 2023-06-12 DIAGNOSIS — N3010 Interstitial cystitis (chronic) without hematuria: Secondary | ICD-10-CM

## 2023-06-12 DIAGNOSIS — G5621 Lesion of ulnar nerve, right upper limb: Secondary | ICD-10-CM

## 2023-06-12 DIAGNOSIS — I10 Essential (primary) hypertension: Secondary | ICD-10-CM

## 2023-06-12 DIAGNOSIS — M549 Dorsalgia, unspecified: Secondary | ICD-10-CM

## 2023-06-12 DIAGNOSIS — H547 Unspecified visual loss: Secondary | ICD-10-CM

## 2023-06-12 DIAGNOSIS — J449 Chronic obstructive pulmonary disease, unspecified: Secondary | ICD-10-CM

## 2023-06-12 DIAGNOSIS — F32A Depression: Secondary | ICD-10-CM

## 2023-06-12 DIAGNOSIS — K859 Acute pancreatitis without necrosis or infection, unspecified: Secondary | ICD-10-CM

## 2023-06-12 DIAGNOSIS — R519 Generalized headaches: Secondary | ICD-10-CM

## 2023-06-12 DIAGNOSIS — F99 Mental disorder, not otherwise specified: Secondary | ICD-10-CM

## 2023-06-12 DIAGNOSIS — J302 Other seasonal allergic rhinitis: Secondary | ICD-10-CM

## 2023-06-12 DIAGNOSIS — G43909 Migraine, unspecified, not intractable, without status migrainosus: Secondary | ICD-10-CM

## 2023-06-12 DIAGNOSIS — G4486 Cervicogenic headache: Secondary | ICD-10-CM

## 2023-06-12 DIAGNOSIS — T753XXA Motion sickness, initial encounter: Secondary | ICD-10-CM

## 2023-06-12 DIAGNOSIS — G5601 Carpal tunnel syndrome, right upper limb: Secondary | ICD-10-CM

## 2023-06-12 DIAGNOSIS — E7800 Pure hypercholesterolemia, unspecified: Secondary | ICD-10-CM

## 2023-06-12 MED ORDER — NORTRIPTYLINE 50 MG PO CAP
50 mg | ORAL_CAPSULE | Freq: Every evening | ORAL | 1 refills | Status: DC
Start: 2023-06-12 — End: 2023-06-12

## 2023-06-12 MED ORDER — NORTRIPTYLINE 50 MG PO CAP
50 mg | ORAL_CAPSULE | Freq: Every evening | ORAL | 1 refills | Status: AC
Start: 2023-06-12 — End: ?

## 2023-06-12 MED ORDER — SUMATRIPTAN SUCCINATE 100 MG PO TAB
ORAL_TABLET | SUBCUTANEOUS | 5 refills | 30.00000 days | Status: DC
Start: 2023-06-12 — End: 2023-06-12

## 2023-06-12 MED ORDER — SUMATRIPTAN SUCCINATE 100 MG PO TAB
ORAL_TABLET | SUBCUTANEOUS | 5 refills | 30.00000 days | Status: AC
Start: 2023-06-12 — End: ?

## 2023-06-12 MED ORDER — RIBOFLAVIN (VITAMIN B2) 400 MG PO TAB
400 mg | ORAL_TABLET | Freq: Every day | ORAL | 3 refills | Status: AC
Start: 2023-06-12 — End: ?

## 2023-06-12 MED ORDER — PROCHLORPERAZINE MALEATE 10 MG PO TAB
10 mg | ORAL_TABLET | ORAL | 5 refills | Status: AC | PRN
Start: 2023-06-12 — End: ?

## 2023-06-12 MED ORDER — CLONAZEPAM 0.5 MG PO TAB
.75 mg | ORAL_TABLET | Freq: Two times a day (BID) | ORAL | 3 refills | Status: DC
Start: 2023-06-12 — End: 2023-06-12

## 2023-06-12 MED ORDER — RIBOFLAVIN (VITAMIN B2) 400 MG PO TAB
400 mg | ORAL_TABLET | Freq: Every day | ORAL | 3 refills | Status: DC
Start: 2023-06-12 — End: 2023-06-12

## 2023-06-12 MED ORDER — PROCHLORPERAZINE MALEATE 10 MG PO TAB
10 mg | ORAL_TABLET | ORAL | 5 refills | Status: DC | PRN
Start: 2023-06-12 — End: 2023-06-12

## 2023-06-12 MED ORDER — CLONAZEPAM 0.5 MG PO TAB
.75 mg | ORAL_TABLET | Freq: Two times a day (BID) | ORAL | 3 refills | Status: AC
Start: 2023-06-12 — End: ?

## 2023-06-12 NOTE — Progress Notes
Date of Service: 06/12/2023  Christina Garrison is a 52 y.o. female w PMH significant for HTN, HLD, vertigo on clonazepam, emphysema, obesity s/p gastric sleeve, OSA on BiPAP, anxiety, PTSD, occipital neuralgia, tension headaches, and migraine, who presents for follow up of her headaches.    Subjective:             Last visit on 12/12/22    She feels like headaches have settled down lately. She had a two week headache in August. Sometimes her headaches will be associated with floating lights in the vision, dizziness. They seem to be associated with the weather changes. They can be frontal or occipital in location. She is currently having lower intensity headaches every other day lasting 1-2 hours. + photophobia, phonophobia    She saw her Psychiatrist, Dr. Marc Morgans, in August who made some changes to Zoloft and going down on the Lamictal due to reduced appetite.      She is currently only taking nortriptyline as needed with headaches. She takes Sumatriptan, benadryl, and compazine for when headaches are just terrible. Once this month, three times last month. She will occasionally take a clonazepam for head pain as well. Takes BID and then may take an extra evening dose if dealing. Not taking much as needed tylenol or other pain relievers right now. She is very excited that Dr Genevie Cheshire will see her another time before retirement               Objective:         acetaminophen (TYLENOL EXTRA STRENGTH) 500 mg tablet Take one tablet by mouth every 6 hours as needed for Pain. Max of 4,000 mg of acetaminophen in 24 hours.    biotin 5,000 mcg TbDi Dissolve 2 tablets by mouth twice daily.    buPROPion HCL SR (WELLBUTRIN SR) 100 mg tablet, 12 hr sustained-release TAKE 1 TABLET BY MOUTH TWICE A DAY    cholecalciferol (VITAMIN D-3) 1,000 units tablet Take one tablet by mouth twice weekly.    clonazePAM (KLONOPIN) 0.5 mg tablet TAKE 1 TABLET BY MOUTH EVERYDAY AT BEDTIME    cyanocobalamin (vitamin B-12) 2,500 mcg tab Take one tablet by mouth daily with breakfast.    diphenhydrAMINE HCL (BENADRYL) 12.5 mg/5 mL oral solution Take 5 mL by mouth every 6 hours as needed.    ezetimibe (ZETIA) 10 mg tablet Take one tablet by mouth daily.    famotidine (PEPCID) 20 mg tablet Take one tablet by mouth daily as needed.    lamoTRIgine (LAMICTAL) 25 mg tablet Take two tablets by mouth at bedtime daily.    multivit, Ca, min-FA-soy isofl 400-60 mcg-mg tab Take 1 tablet by mouth twice daily.    mupirocin (CENTANY) 2 % topical ointment Apply  topically to affected area twice daily.    nebivoloL (BYSTOLIC) 5 mg tablet TAKE 1 TABLET BY MOUTH DAILY    nicotine (NICOTROL NS) 10 mg/mL nasal spray Use 1 spray in each nostril 1-2 times per hour, not to exceed 40 times per day, for up to 12 weeks.    NICOTROL 10 mg inhaler INHALE ONE PUFF BY MOUTH INTO THE LUNGS AS NEEDED. PUFF BY MOUTH AS NEEDED. MAY USE 6-16 CARTRIDGES PER DAY AS NEEDED FOR UP TO 6 MONTHS. DO NOT SMOKE WHILE USING INHALER.    nortriptyline (PAMELOR) 50 mg capsule Take one capsule by mouth at bedtime daily.    ondansetron (ZOFRAN ODT) 4 mg rapid dissolve tablet Dissolve one tablet by mouth every 8 hours  as needed for Nausea or Vomiting. Place on tongue to dissolve.    pantoprazole DR (PROTONIX) 40 mg tablet TAKE 1 TABLET BY MOUTH EVERY DAY (Patient taking differently: Take one tablet by mouth as Needed.)    Potassium 99 mg tab Take one tablet by mouth three times weekly.    prochlorperazine maleate (COMPAZINE) 10 mg tablet Take one tablet by mouth every 6 hours as needed for Nausea or Vomiting.    riboflavin (vitamin B2) 400 mg tab Take one tablet by mouth daily.    rosuvastatin (CRESTOR) 20 mg tablet TAKE 1 TABLET BY MOUTH EVERY DAY    sertraline (ZOLOFT) 25 mg tablet Take one tablet by mouth daily. Please take with 50mg  tablets for total daily dose of 75mg     sertraline (ZOLOFT) 50 mg tablet Take one tablet by mouth daily. Please take with 25mg  tablet for total daily dose of 75mg     SUMAtriptan succinate (IMITREX) 100 mg tablet Take one tablet by mouth at onset of headache. May repeat after 2 hours if needed. Max of 200 mg in 24 hours.  Limit use to less than 10 tablets per month.    verapamil (CALAN) 40 mg tablet Take 1/2 tablet by mouth in the morning and 1 tablet in the evening    vitamins, multi w/minerals 9 mg iron-400 mcg tab Take one tablet by mouth daily with breakfast.     Vitals:    06/12/23 1434   BP: 138/76   Pulse: 71   PainSc: Two   Weight: 99.3 kg (219 lb)   Height: 165.1 cm (5' 5)     Body mass index is 36.44 kg/m?Marland Kitchen     Physical Exam    General physical exam:    HEENT: normocephalic, eyes open with no discharge, nares patent, oropharynx is clear with no lesions, palate intact  Chest: equal rise bilaterally with normal WOB on RA  Ab: non-distended  Skin: no obvious rashes or lesions      Neuro exam:   Mental status: alert, oriented to person/place/month/year     Speech:     Normal Abnormal   Fluency x     Comprehension x     Articulation x             Cranial Nerves:     Normal Abnormal   II PERRLA, visual fields intact bilaterally.     III, IV, VI EOMI w/o nystagmus     V Intact to light touch in V1-V3 bilaterally     VII No facial asymmetry     VIII Gross hearing intact to conversation     IX, X Symmetric palatal elevation     XI Shoulder shrug 5/5 bilaterally     XII Tongue protrudes in midline          Muscle/motor:      Grade 0,1,2,3,4,5)  Neck flexors       Neck extensors         RIGHT LEFT   Shoulder abductors 5 5   Elbow flexors 5 5   Elbow extensors 5 5   Wrist extensors 5 5   Wrist flexors       Finger flexors Weak in 4th and 5th digit 5   Finger extensors       Finger abductors  4     Thumb abductors  4+  5   Hip flexors 5 5   Hip abductors       Hip extensors 5 5  Hip adductors       Knee flexors 5 5   Knee extensors 5 5   Ankle dorsiflexors 5 5   Ankle plantar flexors 5 5   Ankle inversion       Ankle eversion       Toe flexors       Toe extensors               Sensation: Normal RUE LUE RLE LLE   Light Touch   Reduced in R ulnar distribution               Coordination:     Normal Abnormal Right Abnormal Left   Finger to Nose x       Rapid alternating  x       Heel to Shin x       Finger tap        Foot tap          Gait and Station:  Normal gait with good arm swing       Assessment and Plan:    Christina Garrison is a 52 y.o. female with a PMH significant for HTN, HLD, vertigo on clonazepam, emphysema, obesity s/p gastric sleeve, OSA on BiPAP, anxiety, PTSD, occipital neuralgia, tension headaches, and migraine, who presents for follow up of her headaches.     Chronic Migraine with Aura  Occipital Neuralgia  - Current frequency: 1 migraine per month  - Typical migraine: Holoacranial, throbbing, associated with photo and phonophobia and nausea, lasting a few hours. Pressure sensation frontal, bilaterally under the ears, posterior occipital. Usually controlled with sumatriptan 100mg , benadryl and compazine cocktail.   Current frequency- currently in a good period. She used sumatriptan once last month and 3x the month prior. She notes lower grade headaches every other day lasting 1-2 hrs that are tolerable.   Trigger-sunlight  - She has previously tried topiramate and had intolerable side effects.  She is not an ideal candidate for a beta blocker due to her relative hypotension and her history of emphysema.  She has had an adverse reaction to venlafaxine in the past.  - couldn't tolerate tricyclic due to sedation  - overall migraines have been stable, pt not wanting to try something new at this time     Plan:  > Preventive:               Continue nortriptyline 50mg  qhs, educated on taking daily instead of as needed               Continue riboflavin 400 mg daily, she is not taking magnesium as it is less effective               Can consider a CGRP antagonist in future if breakthrough headaches continue to occur >x1/wk with taking other medications as prescribed  > Abortive therapy: continue sumatriptan 100mg  PRN along with benadryl and compazine     R Ulnar Neuropathy  R Carpel Tunnel  R Sciatica  - Neuro exam with normal strength, reflexes and sensory exam in b/l LE  - multiple prior surgeries for R Ulnar neuropathy w/o much benefit  - 03/21/23 MRI L spine  1. Mild multilevel lumbar disc bulges and facet arthropathy.   2. 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 Mild bilateral recess stenosis at L4-5. Moderate bilateral foraminal   stenosis at this level.          Vertigo  Her symptoms  continue to be well-managed on clonazepam.  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  Plan:  > Continue clonazepam 0.75 mg BID and 1 mg daily PRN        Pt seen and discussed with Dr. Genevie Cheshire.    Caprice Beaver, MD  Neurology PGY2

## 2023-06-16 ENCOUNTER — Encounter: Admit: 2023-06-16 | Discharge: 2023-06-16 | Payer: BC Managed Care – PPO

## 2023-07-09 ENCOUNTER — Encounter: Admit: 2023-07-09 | Discharge: 2023-07-09 | Payer: BC Managed Care – PPO

## 2023-07-09 ENCOUNTER — Ambulatory Visit: Admit: 2023-07-09 | Discharge: 2023-07-10 | Payer: BC Managed Care – PPO

## 2023-07-09 DIAGNOSIS — F418 Other specified anxiety disorders: Secondary | ICD-10-CM

## 2023-07-09 DIAGNOSIS — F4310 Post-traumatic stress disorder, unspecified: Secondary | ICD-10-CM

## 2023-07-09 DIAGNOSIS — F341 Dysthymic disorder: Secondary | ICD-10-CM

## 2023-07-09 MED ORDER — BUPROPION SR 100 MG PO TBSR
100 mg | Freq: Every day | ORAL | 1 refills | Status: AC
Start: 2023-07-09 — End: ?

## 2023-07-09 MED ORDER — SERTRALINE 100 MG PO TAB
100 mg | ORAL_TABLET | Freq: Every day | ORAL | 1 refills | Status: AC
Start: 2023-07-09 — End: ?

## 2023-07-09 MED ORDER — LAMOTRIGINE 25 MG PO TAB
25 mg | ORAL_TABLET | Freq: Every evening | ORAL | 1 refills | Status: AC
Start: 2023-07-09 — End: ?
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
## 2023-07-09 NOTE — Patient Instructions
It was nice to see you in clinic today!    If you have any questions, you may reach the clinic by calling [phone] or, if you have MyChart access, can message me through your chart.    Brief Summary of today's visit:  Continue all medications as prescribed    We will see you back in about 16 weeks for follow up.    Please call the clinic at [phone] to reschedule or cancel appointments if a conflict with your schedule arises. For all appointments, please arrive 15 minutes prior to the start of your visit to facilitate rooming and other necessary check-in procedures. If you are not able to check-in on time, you may need to reschedule.    Please do not hesitate to call the clinic or contact us via MyChart if you have a change in your symptoms, if you have questions regarding your psychiatric medications, or if you start to develop side effects to medications prescribed by your psychiatrist. You can activate your MyChart account by visiting the website at [email or web address].     Dr. Marc Morgans   [place] [place] Psychiatry Resident, PGY-3    Our clinic is dedicated to making sure your prescriptions are filled in a timely manner during regular business hours (8am-5pm).  If you need a medication refill, please call your pharmacy to request a refill.  Please make sure to request refills at least 72 hours in advance of your last dose.  Your pharmacy will reach out to [place] electronically, which is the preferred method.  Please try to refrain from paging the on-call psychiatrist regarding medication refills (including controlled substances), as you may not receive a refill or a full refill at that time.    [facility] provides its patients with 24/7 care. If you are concerned about an impending psychiatric emergency (i.e. if you have any concerns about risk of harm to yourself, risk of harm to others or feel unsure about your ability to care for yourself), you may reach out to the overnight psychiatrist on-call by calling the main [facility] operator line outside of normal business hours ([phone]). If you are unsure if you are experiencing a psychiatric emergency, please ask the operator to page the on-call psychiatrist for clarification.    In the event of a safety concern or suicidal thoughts, call 911 or present to the nearest hospital emergency department. National Suicide Prevention Lifeline 639 535 8058 (TALK), or you may also dial 988. The Crisis Text Hotline (text 424-854-2476) is always available by SMS/Text. The Compassionate Ear phone line is a resource for talk support in non-emergency situations (1-866-WARMEAR).    If you have received a referral for therapy services through [facility], please request an intake packet at the soonest convenience to facilitate the intake process.

## 2023-07-09 NOTE — Progress Notes
ATTENDING NOTE  I saw and discussed Christina Garrison with [name], MD and concur with the assessment and treatment plan. Patient is 52 y.o. female with MDD and PTSD. Psychiatric symptoms well controlled at today's encounter, recent changes have been helpful. Denies SI/HI and AVH and no other safety concerns. Pt reports no medication side effects.    PLAN:  The following medications will be continued for the above symptoms:  Sertraline 100mg  daily  Lamotrigine 25mg  QHS  Wellbutrin SR 100mg  daily  Klonopin and nortriptyline per other providers  No labs needed    [employer], DO  07/09/2023      Current Outpatient Medications   Medication Instructions    acetaminophen (TYLENOL EXTRA STRENGTH) 500 mg, Oral, EVERY  6 HOURS PRN, Max of 4,000 mg of acetaminophen in 24 hours.    biotin 5,000 mcg TbDi 2 tablets, Oral, TWICE DAILY    buPROPion HCL SR (WELLBUTRIN SR) 100 mg, Oral, TWICE DAILY    CHOLEcalciferoL (vitamin D3) 1,000 Units, Oral, TWO TIMES WEEKLY    clonazePAM (KLONOPIN) 0.75 mg, Oral, TWICE DAILY    cyanocobalamin (vitamin B-12) 2,500 mcg tab 1 tablet, Oral, DAILY WITH BREAKFAST    diphenhydrAMINE HCL (BENADRYL ALLERGY) 12.5 mg, Oral, EVERY  6 HOURS PRN    ezetimibe (ZETIA) 10 mg, Oral, DAILY    famotidine (PEPCID) 20 mg, Oral, DAILY  PRN    lamoTRIgine (LAMICTAL) 50 mg, Oral, AT BEDTIME DAILY    multivit, Ca, min-FA-soy isofl 400-60 mcg-mg tab 1 tablet, Oral, TWICE DAILY    mupirocin (CENTANY) 2 % topical ointment Topical, TWICE DAILY    nebivoloL (BYSTOLIC) 5 mg, Oral, DAILY    nicotine (NICOTROL NS) 10 mg/mL nasal spray Use 1 spray in each nostril 1-2 times per hour, not to exceed 40 times per day, for up to 12 weeks.    NICOTROL 10 mg inhaler 1 puff, Inhalation, AS NEEDED, Puff by mouth as needed. May use 6-16 cartridges per day as needed for up to 6 months. Do not smoke while using inhaler.    nortriptyline (PAMELOR) 50 mg, Oral, AT BEDTIME DAILY    ondansetron (ZOFRAN ODT) 4 mg, Oral, EVERY  8 HOURS PRN, Place on tongue to dissolve.    pantoprazole DR (PROTONIX) 40 mg tablet TAKE 1 TABLET BY MOUTH EVERY DAY    Potassium 99 mg tab 1 tablet, Oral, THREE TIMES WEEKLY    prochlorperazine maleate (COMPAZINE) 10 mg, Oral, EVERY  6 HOURS PRN    riboflavin (vitamin B2) 400 mg, Oral, DAILY    rosuvastatin (CRESTOR) 20 mg, Oral, DAILY    sertraline (ZOLOFT) 25 mg, Oral, DAILY, Please take with 50mg  tablets for total daily dose of 75mg     sertraline (ZOLOFT) 50 mg, Oral, DAILY, Please take with 25mg  tablet for total daily dose of 75mg     SUMAtriptan succinate (IMITREX) 100 mg tablet Take one tablet by mouth at onset of headache. May repeat after 2 hours if needed. Max of 200 mg in 24 hours.  Limit use to less than 10 tablets per month.    verapamil (CALAN) 40 mg tablet Take 1/2 tablet by mouth in the morning and 1 tablet in the evening    vitamins, multi w/minerals 9 mg iron-400 mcg tab 1 tablet, Oral, DAILY WITH BREAKFAST

## 2023-07-16 ENCOUNTER — Encounter: Admit: 2023-07-16 | Discharge: 2023-07-16 | Payer: BC Managed Care – PPO

## 2023-08-20 ENCOUNTER — Encounter: Admit: 2023-08-20 | Discharge: 2023-08-20 | Payer: BC Managed Care – PPO

## 2023-08-20 DIAGNOSIS — I10 Essential (primary) hypertension: Secondary | ICD-10-CM

## 2023-08-20 MED ORDER — NEBIVOLOL 5 MG PO TAB
5 mg | ORAL_TABLET | Freq: Every day | ORAL | 0 refills | 60.00000 days | Status: AC
Start: 2023-08-20 — End: ?

## 2023-08-31 ENCOUNTER — Encounter: Admit: 2023-08-31 | Discharge: 2023-08-31 | Payer: BC Managed Care – PPO

## 2023-09-18 ENCOUNTER — Encounter: Admit: 2023-09-18 | Discharge: 2023-09-18 | Payer: BC Managed Care – PPO

## 2023-09-18 DIAGNOSIS — I10 Essential (primary) hypertension: Secondary | ICD-10-CM

## 2023-09-18 MED ORDER — NEBIVOLOL 5 MG PO TAB
5 mg | ORAL_TABLET | Freq: Every day | ORAL | 0 refills | 60.00000 days | Status: AC
Start: 2023-09-18 — End: ?

## 2023-09-24 ENCOUNTER — Encounter: Admit: 2023-09-24 | Discharge: 2023-09-24 | Payer: BC Managed Care – PPO

## 2023-09-24 NOTE — Progress Notes
Letter mailed to patient regarding missed lung cancer screening appointment.  Will follow up in 1 months' time and notify referring provider if patient does not call back to schedule. Peter Minium

## 2023-10-08 ENCOUNTER — Encounter: Admit: 2023-10-08 | Discharge: 2023-10-08 | Payer: BC Managed Care – PPO

## 2023-10-19 ENCOUNTER — Encounter: Admit: 2023-10-19 | Discharge: 2023-10-19 | Payer: BC Managed Care – PPO

## 2023-10-23 ENCOUNTER — Encounter: Admit: 2023-10-23 | Discharge: 2023-10-23 | Payer: BC Managed Care – PPO

## 2023-10-23 DIAGNOSIS — I10 Essential (primary) hypertension: Secondary | ICD-10-CM

## 2023-10-23 MED ORDER — NEBIVOLOL 5 MG PO TAB
5 mg | ORAL_TABLET | Freq: Every day | ORAL | 0 refills | 60.00000 days | Status: AC
Start: 2023-10-23 — End: ?

## 2023-10-30 ENCOUNTER — Encounter: Admit: 2023-10-30 | Discharge: 2023-10-30 | Payer: BC Managed Care – PPO

## 2023-10-30 NOTE — Progress Notes
 Patient has not scheduled LCS.  Letter sent to referring provider to notify.  Christina Garrison

## 2023-11-02 ENCOUNTER — Ambulatory Visit: Admit: 2023-11-02 | Discharge: 2023-11-03 | Payer: BC Managed Care – PPO

## 2023-11-02 ENCOUNTER — Encounter: Admit: 2023-11-02 | Discharge: 2023-11-02 | Payer: BC Managed Care – PPO

## 2023-11-02 DIAGNOSIS — F418 Other specified anxiety disorders: Secondary | ICD-10-CM

## 2023-11-02 MED ORDER — LAMOTRIGINE 25 MG PO TAB
25 mg | ORAL_TABLET | Freq: Every evening | ORAL | 1 refills | Status: AC
Start: 2023-11-02 — End: ?

## 2023-11-02 MED ORDER — SERTRALINE 100 MG PO TAB
100 mg | ORAL_TABLET | Freq: Every day | ORAL | 1 refills | Status: AC
Start: 2023-11-02 — End: ?

## 2023-11-02 MED ORDER — BUPROPION SR 100 MG PO TBSR
100 mg | Freq: Every day | ORAL | 1 refills | Status: AC
Start: 2023-11-02 — End: ?

## 2023-11-02 NOTE — Progress Notes
I have discussed this patient's care with the resident and concur with content written but did not personally see and exam the patient today. PDMP reviewed.    Christina Glace A Ricka Westra, DO

## 2023-11-02 NOTE — Patient Instructions
 It was nice to see you in clinic today!    If you have any questions, you may reach the clinic by calling [phone] or, if you have MyChart access, can message me through your chart.    Brief Summary of today's visit:  Continue all medcation    We will see you back in about 16 for follow up.    Please call the clinic at [phone] to reschedule or cancel appointments if a conflict with your schedule arises. For all appointments, please arrive 15 minutes prior to the start of your visit to facilitate rooming and other necessary check-in procedures. If you are not able to check-in on time, you may need to reschedule.    Please do not hesitate to call the clinic or contact us via MyChart if you have a change in your symptoms, if you have questions regarding your psychiatric medications, or if you start to develop side effects to medications prescribed by your psychiatrist. You can activate your MyChart account by visiting the website at [email or web address].     Dr. Marc Morgans   [facility] Psychiatry Resident, PGY-3    Our clinic is dedicated to making sure your prescriptions are filled in a timely manner during regular business hours (8am-5pm).  If you need a medication refill, please call your pharmacy to request a refill.  Please make sure to request refills at least 72 hours in advance of your last dose.  Your pharmacy will reach out to [place] electronically, which is the preferred method.  Please try to refrain from paging the on-call psychiatrist regarding medication refills (including controlled substances), as you may not receive a refill or a full refill at that time.    [facility] provides its patients with 24/7 care. If you are concerned about an impending psychiatric emergency (i.e. if you have any concerns about risk of harm to yourself, risk of harm to others or feel unsure about your ability to care for yourself), you may reach out to the overnight psychiatrist on-call by calling the main [facility] operator line outside of normal business hours [phone]). If you are unsure if you are experiencing a psychiatric emergency, please ask the operator to page the on-call psychiatrist for clarification.    In the event of a safety concern or suicidal thoughts, call 911 or present to the nearest hospital emergency department. National Suicide Prevention Lifeline (539)796-9424 (TALK), or you may also dial 988. The Crisis Text Hotline (text 336-759-2906) is always available by SMS/Text. The Compassionate Ear phone line is a resource for talk support in non-emergency situations (1-866-WARMEAR).    If you have received a referral for therapy services through [facility], please request an intake packet at the soonest convenience to facilitate the intake process.
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
## 2023-11-03 DIAGNOSIS — F4310 Post-traumatic stress disorder, unspecified: Secondary | ICD-10-CM

## 2023-11-03 DIAGNOSIS — F341 Dysthymic disorder: Secondary | ICD-10-CM

## 2023-12-24 ENCOUNTER — Encounter: Admit: 2023-12-24 | Discharge: 2023-12-24 | Payer: PRIVATE HEALTH INSURANCE | Primary: General Practice

## 2023-12-24 ENCOUNTER — Ambulatory Visit: Admit: 2023-12-24 | Discharge: 2023-12-25 | Payer: PRIVATE HEALTH INSURANCE | Primary: General Practice

## 2023-12-24 DIAGNOSIS — I288 Other diseases of pulmonary vessels: Secondary | ICD-10-CM

## 2023-12-24 DIAGNOSIS — Z136 Encounter for screening for cardiovascular disorders: Secondary | ICD-10-CM

## 2023-12-24 NOTE — Patient Instructions
 Thank you for visiting our office today.    We would like to make the following medication adjustments:  NONE       Otherwise continue the same medications as you have been doing.          We will be pursuing the following tests after your appointment today:       Orders Placed This Encounter    ECG 12-LEAD     FASTING labs (10-12hrs)    We will plan to see you back in 12 months.  Please call us  in the meantime with any questions or concerns.        Please allow 5-7 business days for our providers to review your results. All normal results will go to MyChart. If you do not have Mychart, it is strongly recommended to get this so you can easily view all your results. If you do not have mychart, we will attempt to call you once with normal lab and testing results. If we cannot reach you by phone with normal results, we will send you a letter.  If you have not heard the results of your testing after one week please give us  a call.       Your [facility] [place] Siegfried Dress, Towanda Fret, Prentice Brochure, and [place])  phone number is [phone].

## 2023-12-24 NOTE — Progress Notes
 Date of Service: 12/24/2023    Christina Garrison is a 53 y.o. female.       HPI   Christina Garrison has been followed for difficult to control hypertension.  Her blood pressure has been very well controlled on her current antihypertensive regimen. In December 2020 she underwent a sleep study which was abnormal and she now uses CPAP.  Her greatest concern at the present time is to see whether she might qualify for GLP-1 agonist to help promote weight loss.  From a cardiovascular perspective, the patient has been doing well and reports no angina, congestive symptoms, palpitations, sensation of sustained forceful heart pounding, lightheadedness or syncope. She walks her dogs for several miles daily.  She now has 11 dogs. The patient reports no myalgias, bleeding abnormalities, claudication or strokelike symptoms.  She previously had mylgias while taking pravastatin. She is now taking rosuvastatin 20 mg daily which she has been tolerating without difficulty. She does take short acting verapamil and indicates that she has to take short acting medications and cannot take sustained release medications because of her gastric sleeve bariatric surgery.  Christina Garrison has not smoked since 2012-02-25 but still infrequently uses nicotine to suppress urges to smoke.  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    Historically regarding the treatment of her hypertension, Christina Garrison reports that an ACE inhibitor previously caused a cough and initially nebivolol was ineffective. At one time she was taking a thiazide diuretic, but this was stopped when it was thought to possibly have contributed to an episode of pancreatitis on October 30, 2010. Amlodipine 5 mg daily was started with good results. Nebivolol 5 mg daily was restarted and she has tolerated this without difficulty. Her past medical history is noteworthy for interstitial cystitis. Christina Garrison underwent laparoscopic cholecystectomy on 05/01/11 with resolution of her recurrent abdominal discomfort. She reports chronic problems with recurrent sinus infections. 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 Christina Garrison reports that her surgery was complicated by fluid retention and pulmonary congestion.  Christina Garrison is a prior cigarette smoker and had smoked a half-a-pack per day for 23 years. In September 2013 she stopped smoking.  She has hyperlipidemia, but is not known to be diabetic. She has a family history for coronary disease. In 24-Feb-2010 a brother died suddenly at age 34. Her father is still living, but has coronary disease at age 49. Her mother had coronary disease. Her maternal grandfather died from coronary disease at age 50 and her maternal grandmother died from coronary disease, also age 34. One sister has lung cancer and 2 siblings have high blood pressure. No siblings have documented coronary disease, but as mentioned, her brother died suddenly in February 24, 2010 at age 82.   She underwent open repair of supraumbilical hernia with mesh on 09/23/2018 with excellent results.  On 06/03/2019 she received local pain injections for bilateral occipital neuralgia, myalgia, neuropathic pain, muscle spasm and myofascial pain.  It appears that on June 04, 2019 she was seen for chest discomfort in [place]  and on June 09, 2019 she presented to [facility] hospital with chest discomfort.  There was no evidence for an acute coronary syndrome and stress testing was performed which showed no evidence of myocardial ischemia.  Her chest discomfort appeared to be clearly musculoskeletal.  The patient underwent left carpal tunnel surgery on 10/21/2019 and right carpal tunnel surgery on 12/27/2019  with good results.  She underwent bilateral mammoplasty reduction, lateral chest wall liposuction and liposuction to the abdomen and flanks on July 23, 2020.  She indicated that she developed wound dehiscence and required a wound VAC with eventual wound healing.  On April 11, 2021 she underwent orthopedic surgery for right ulnar neuropathy at the elbow, right elbow medial epicondylitis, right ulnar neuropathy, right carpal tunnel syndrome, trigger finger of the right middle, ring and small finger and right thumb joint arthritis.       Vitals:    12/24/23 1138   BP: 119/80   BP Source: Arm, Left Lower   Pulse: 72   SpO2: 95%   PainSc: Zero   Weight: 92.1 kg (203 lb)  Comment: Pt stated weight from last week. Refused to weigh in clinic   Height: 162.6 cm (5' 4)     Body mass index is 34.84 kg/m?Aaron Aas     Past Medical History  Patient Active Problem List    Diagnosis Date Noted    Contracture of joint of finger of right hand 01/29/2022    Other diseases of pulmonary vessels (CMS-HCC) 07/04/2021    Trigger finger, right little finger 03/25/2021    Trigger finger, right ring finger 03/25/2021    Trigger finger, right middle finger 03/25/2021    Medial epicondylitis of elbow, right 03/25/2021    [place]on syndrome, right 03/25/2021    Cubital tunnel syndrome on right 03/25/2021    Right carpal tunnel syndrome 03/25/2021    Wound dehiscence, surgical 11/14/2020    Migraine without aura and without status migrainosus, not intractable 10/11/2020     Left sided   1-2 a week as of 10/2020  + photophobia, nausea  On Depakote  500 mg DR, Nereida Banning, compazine       Cellulitis of breast 09/19/2020    Wound healing, delayed 09/19/2020    Macromastia 07/23/2020    Encounter for cosmetic surgery 05/02/2020    Localized adiposity 05/02/2020    Hypertrophy of breast 03/21/2020    Carpal tunnel syndrome on right 12/27/2019    Persistent depressive disorder 11/24/2019    Other specified anxiety disorders 11/24/2019    PTSD (post-traumatic stress disorder) 11/24/2019    Nicotine  dependence 11/24/2019    Nicotine  abuse 10/27/2019    Chest pain 06/09/2019    Bilateral arm weakness 05/27/2019     Describes neck manipulation at chiropractor starting in May, bilateral arm weakness/numbness/pain that began one week ago, and ongoing symptoms of bi-occipital, posterior neck pain moving anteriorly since June 2020      CTA head and neck with and without contrast negative for any dissection, large vessel stenosis    No longer endorsing these symptoms, but does state post L carpal tunnel release surgery, she's been having pain in her left wrist       Bilateral occipital neuralgia 05/27/2019     Describes bilateral neck/occipital region pain that moves anteriorly. Tenderness to palpation on bilateral trap>occipital grooves.     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 Described as a jerking sensation lasting seconds.     MRI brain with and without contrast negative    Evaluation by neuro-otologist did not find any pathological findings    Continues to endorse some intermittent dizziness       Obstructive sleep apnea syndrome 05/21/2015  PSG on 03/16/15: AHI 8.9 events per hour,The REM AHI was 21.3, supine REM AHI 37.  The overall supine AHI was 16 and lateral AHI was 0. Oxyhemoglobin saturation nadir of 84% and Time below 88% was 12.8 minutes.   PLMS 0/hr   12/19/2015 oximetry on 2 Liters, <88% 6 minutes.  Increase to 3 liters and recheck.  Much better headaches.      Obesity (BMI 30-39.9) 05/21/2015     Body mass index is 30.71 kg/(m^2).      Chronic insomnia 05/21/2015     - trouble with sleep maintenance   - Components of psychophysiologic insomnia and anxiety  - Has not used clonazepam for this, had leg cramps with lunesta  - poor sleep hygiene     Follows with Dr. Florie Husband, currently non compliant with CPAP but does use supplemental oxygen overnight and being worked up for adequate use       Sleep disorder 11/09/2014    Hyperlipidemia 08/26/2012    Vertigo 08/11/2012     Spells for years of intermittent dizziness, rarely with vertigo but now described more in terms of altered cognition., initially positionally provoked, now lasting for only seconds.  Occurs daily currently.  Initially with feeling of L ear fullness and pain.  Has been associated with headaches as well  Does have a h/o falling down stairs hitting her head and neck 20 years ago  MRI wo/w + IAC 10/2012 without significant abnormalities  Neuro-otology workup with likely central cause of dizziness (normal audiogram and mostly normal VEMPs, normal electronystagmography, no canal dehiscence on imaging)  Has undergone PT for vestibular and cervical causes  EEG (routine) normal    Current treatment: Clonazepam 1mg  qAM, qPM, Depakote 250 BID (for mood), Acupuncture (has had some response)    Prior trials: Prozac (SE), Effexor (suicidal thoughts), Topamax (speech difficulties), nortriptyline, Indomethacin (helped headache but GI SE), Magnesium/Riboflavin/coQ10, Propranolol (changed to Bystolic), Verapamil.  Occipital block with poor tolerance due to lasting pain at injection site      Tension headache 08/11/2012     frontal/L eye pressure since her 30s with conjunctival injection/photo/phono.  Able to work through this without any medication.  Sleeps in positions that provoke neck/back pain.  Significant greater occipital tenderness on exam  Improved with treatment of OSA, and after occipital nerve blocks (though poorly tolerated as above)  History of frontal meningocele (resected), head trauma (fell down stairs)    Currently taking a swig of liquid tylenol x2 a week to help treat. Also taking migraine cocktail for it with mild improvement       Allergic rhinitis 03/29/2012     Pt is a 53 yo F who presents to ENT clinic for evaluation of her sinus allergies.  Patient states that she has a 1 year history of sinus pressure and congestion.  Usual she has congestion behind her eyes and in her cheeks.  + Facial pain.  Symptoms are worse in spring and summer.  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  Patient has taken claritin however it makes her dizzy. She is currently doing daily sinus rinses as well as flonase.  Currently she feels her sinuses are not that bad.    Patient has secondary complaint of dizziness for 1 year.  It is an off balance sensation when she is driving or bending over.  Lasts only momentarily. No sensation of the room spinning. No hearing loss, tinnitus. Some L ear fullness.  Chest pain of uncertain etiology 11/29/2010    Pancreatitis 11/21/2010     2/12 Hospitalization [facility].      Essential hypertension 11/21/2010         Review of Systems   Constitutional: Negative.   HENT: Negative.     Eyes: Negative.    Cardiovascular: Negative.    Respiratory: Negative.     Endocrine: Negative.    Hematologic/Lymphatic: Negative.    Skin: Negative.    Musculoskeletal: Negative.    Gastrointestinal: Negative.    Genitourinary: Negative.    Neurological: Negative.    Psychiatric/Behavioral: Negative.     Allergic/Immunologic: Negative.        Physical Exam  GENERAL: The patient is well developed, well nourished, resting comfortably and in no distress.    HEENT: No abnormalities of the visible oro-nasopharynx, conjunctiva or sclera are noted.  NECK: There is no jugular venous distension. Carotids are palpable and without bruits. There is no thyroid enlargement.  Chest: Lung fields are clear to auscultation. There are no wheezes or crackles.    CV: There is a regular rhythm. The first and second heart sounds are normal. There are no murmurs, gallops or rubs. Her apical heart rate is 68 BPM.  ABD: The abdomen is soft and supple with normal bowel sounds. There is no hepatosplenomegaly, ascites, tenderness, masses or bruits.  Neuro: There are no focal motor defects. Ambulation is normal. Cognitive function appears normal.  Ext: There is no edema or evidence of deep vein thrombosis. Peripheral pulses are satisfactory.  She is wearing a left wrist brace.  SKIN: There are no rashes and no cellulitis  PSYCH: The patient is calm, rationale and oriented    Cardiovascular Studies  A twelve-lead ECG obtained on 12/24/2023 shows normal sinus rhythm with a heart rate of 69 bpm.  There is no evidence of myocardial ischemia or infarction.  An echo Doppler study was obtained on 06/21/2020 revealed:  Interpretation Summary     The left ventricular size, wall thickness and systolic function are normal. The ejection fraction by Simpson's biplane method is 60%. Normal left ventricular diastolic function.      Echocardiographic Findings     Left Ventricle The left ventricular size, wall thickness and systolic function are normal. The ejection fraction by Simpson's biplane method is 60%. Normal left ventricular diastolic function.   Right Ventricle The right ventricular size is normal. The right ventricular systolic function is normal.   Left Atrium Normal size.   Right Atrium Normal size.   IVC/SVC Normal central venous pressure (0-5 mm Hg).   Mitral Valve Normal valve structure. No stenosis. Trace regurgitation.   Tricuspid Valve Normal valve structure. No stenosis. Trace regurgitation.   Aortic Valve The valve has focal thickening. No stenosis. No regurgitation.   Pulmonary The pulmonic valve was not seen well but no Doppler evidence of stenosis. Normal valve structure.   Aorta Normal aorta. The aortic root and ascending aorta are normal in size.   Pericardium Pericardium is normal.      PET/CT stress study 06/10/2019:  Normal pharmacologic stress myocardial perfusion, flow quantification, and   function.   No significant coronary calcification.   Cardiovascular Health Factors  Vitals BP Readings from Last 3 Encounters:   12/24/23 119/80   06/12/23 138/76   04/16/23 129/80     Wt Readings from Last 3 Encounters:   12/24/23 92.1 kg (203 lb)   06/12/23 99.3 kg (219 lb)   04/16/23 99.3 kg (219 lb)  BMI Readings from Last 3 Encounters:   12/24/23 34.84 kg/m?   06/12/23 36.44 kg/m?   04/16/23 36.44 kg/m?      Smoking Social History     Tobacco Use   Smoking Status Former    Current packs/day: 0.00    Average packs/day: 2.0 packs/day for 30.0 years (59.9 ttl pk-yrs)    Types: Cigarettes    Start date: 40    Quit date: 05/02/2012    Years since quitting: 11.6   Smokeless Tobacco Never   Tobacco Comments    Started vaping 4 years ago      Lipid Profile Cholesterol   Date Value Ref Range Status   08/16/2021 182  Final     HDL   Date Value Ref Range Status   08/16/2021 44  Final     LDL   Date Value Ref Range Status   08/16/2021 115 (H) <100 Final     Triglycerides   Date Value Ref Range Status   08/16/2021 118  Final      Blood Sugar Hemoglobin A1C   Date Value Ref Range Status   07/02/2011 5.9  Final     Glucose   Date Value Ref Range Status   08/16/2021 91  Final   10/10/2020 74 70 - 100 MG/DL Final   95/62/1308 73 70 - 100 MG/DL Final          Problems Addressed Today  Encounter Diagnoses   Name Primary?    Screening for heart disease Yes       Assessment and Plan     Ms. Luz does not currently have a primary care physician.  She was encouraged to obtain one as quickly as possible and discuss whether she may qualify for a GLP-1 agonist to promote weight loss.  There are no cardiovascular reasons why she could not take a GLP-1 agonist such as semaglutide.  She was given a requisition to obtain a Chem-7, fasting lipid profile and ALT.  Cardiovascular risk factor modification was reviewed in detail. Regular mild aerobic exercise, weight loss and adherence to a heart healthy diet were recommended.  I have asked her to return for follow-up in 1 years time. The total time spent during this interview and exam with preparation and chart review was 30 minutes.         Current Medications (including today's revisions)   acetaminophen (TYLENOL EXTRA STRENGTH) 500 mg tablet Take one tablet by mouth every 6 hours as needed for Pain. Max of 4,000 mg of acetaminophen in 24 hours.    biotin 5,000 mcg TbDi Dissolve two tablets by mouth twice daily.    buPROPion HCL SR (WELLBUTRIN SR) 100 mg tablet, 12 hr sustained-release Take one tablet by mouth daily.    cholecalciferol (VITAMIN D-3) 1,000 units tablet Take one tablet by mouth twice weekly.    clonazePAM (KLONOPIN) 0.5 mg tablet Take 1.5 tablets by mouth twice daily.    cyanocobalamin (vitamin B-12) 2,500 mcg tab Take one tablet by mouth daily with breakfast.    diphenhydrAMINE HCL (BENADRYL) 12.5 mg/5 mL oral solution Take 5 mL by mouth every 6 hours as needed.    ezetimibe (ZETIA) 10 mg tablet Take one tablet by mouth daily.    famotidine (PEPCID) 20 mg tablet Take one tablet by mouth daily as needed.    lamoTRIgine (LAMICTAL) 25 mg tablet Take one tablet by mouth at bedtime daily.    multivit, Ca, min-FA-soy isofl 400-60 mcg-mg tab Take 1 tablet  by mouth twice daily.    nebivoloL  (BYSTOLIC ) 5 mg tablet TAKE 1 TABLET BY MOUTH EVERY DAY    nicotine  (NICOTROL  NS) 10 mg/mL nasal spray Use 1 spray in each nostril 1-2 times per hour, not to exceed 40 times per day, for up to 12 weeks. (Patient taking differently: as Needed. Use 1 spray in each nostril 1-2 times per hour, not to exceed 40 times per day, for up to 12 weeks.)    NICOTROL  10 mg inhaler INHALE ONE PUFF BY MOUTH INTO THE LUNGS AS NEEDED. PUFF BY MOUTH AS NEEDED. MAY USE 6-16 CARTRIDGES PER DAY AS NEEDED FOR UP TO 6 MONTHS. DO NOT SMOKE WHILE USING INHALER.    nortriptyline  (PAMELOR ) 50 mg capsule Take one capsule by mouth at bedtime daily.    ondansetron  (ZOFRAN  ODT) 4 mg rapid dissolve tablet Dissolve one tablet by mouth every 8 hours as needed for Nausea or Vomiting. Place on tongue to dissolve.    pantoprazole  DR (PROTONIX ) 40 mg tablet TAKE 1 TABLET BY MOUTH EVERY DAY (Patient taking differently: Take one tablet by mouth as Needed.)    Potassium 99 mg tab Take one tablet by mouth three times weekly.    prochlorperazine  maleate (COMPAZINE ) 10 mg tablet Take one tablet by mouth every 6 hours as needed for Nausea or Vomiting.    riboflavin  (vitamin B2) 400 mg tablet Take one tablet by mouth daily.    rosuvastatin  (CRESTOR ) 20 mg tablet Take one tablet by mouth daily.    sertraline  (ZOLOFT ) 100 mg tablet Take one tablet by mouth daily.    SUMAtriptan  succinate (IMITREX ) 100 mg tablet Take one tablet by mouth at onset of headache. May repeat after 2 hours if needed. Max of 200 mg in 24 hours.  Limit use to less than 10 tablets per month.    verapamil  (CALAN ) 40 mg tablet Take 1/2 tablet by mouth in the morning and 1 tablet in the evening (Patient taking differently: Take one tablet by mouth daily. Take 1/2 tablet by mouth in the morning and 1 tablet in the evening)    vitamins, multi w/minerals 9 mg iron-400 mcg tab Take one tablet by mouth daily with breakfast.

## 2023-12-24 NOTE — Telephone Encounter
 Pt called to say she has appt scheduled with Agustin Aldo APRN at [facility] tomorrow at 0900. Faxed Dr. Lindbergh Reusing office visit to this office at [phone] at pt's request.

## 2023-12-27 ENCOUNTER — Encounter: Admit: 2023-12-27 | Discharge: 2023-12-27 | Payer: PRIVATE HEALTH INSURANCE | Primary: General Practice

## 2023-12-29 ENCOUNTER — Encounter: Admit: 2023-12-29 | Discharge: 2023-12-29 | Payer: PRIVATE HEALTH INSURANCE | Primary: General Practice

## 2024-01-12 ENCOUNTER — Encounter: Admit: 2024-01-12 | Discharge: 2024-01-12 | Payer: PRIVATE HEALTH INSURANCE | Primary: General Practice

## 2024-01-22 ENCOUNTER — Encounter: Admit: 2024-01-22 | Discharge: 2024-01-22 | Payer: PRIVATE HEALTH INSURANCE | Primary: General Practice

## 2024-01-22 DIAGNOSIS — I10 Essential (primary) hypertension: Secondary | ICD-10-CM

## 2024-01-22 MED ORDER — NEBIVOLOL 5 MG PO TAB
5 mg | ORAL_TABLET | Freq: Every day | ORAL | 0 refills | 60.00000 days | Status: AC
Start: 2024-01-22 — End: ?

## 2024-02-03 ENCOUNTER — Encounter: Admit: 2024-02-03 | Discharge: 2024-02-03 | Payer: PRIVATE HEALTH INSURANCE | Primary: General Practice

## 2024-02-19 ENCOUNTER — Encounter: Admit: 2024-02-19 | Discharge: 2024-02-19 | Payer: PRIVATE HEALTH INSURANCE | Primary: General Practice

## 2024-02-25 ENCOUNTER — Encounter: Admit: 2024-02-25 | Discharge: 2024-02-25 | Payer: PRIVATE HEALTH INSURANCE | Primary: General Practice

## 2024-02-25 DIAGNOSIS — I10 Essential (primary) hypertension: Secondary | ICD-10-CM

## 2024-02-25 MED ORDER — NEBIVOLOL 5 MG PO TAB
5 mg | ORAL_TABLET | Freq: Every day | ORAL | 1 refills | 60.00000 days | Status: AC
Start: 2024-02-25 — End: ?

## 2024-02-26 ENCOUNTER — Ambulatory Visit: Admit: 2024-02-26 | Discharge: 2024-02-27 | Payer: PRIVATE HEALTH INSURANCE | Primary: General Practice

## 2024-02-26 ENCOUNTER — Encounter: Admit: 2024-02-26 | Discharge: 2024-02-26 | Payer: PRIVATE HEALTH INSURANCE | Primary: General Practice

## 2024-02-26 DIAGNOSIS — G43009 Migraine without aura, not intractable, without status migrainosus: Principal | ICD-10-CM

## 2024-02-26 MED ORDER — SUMATRIPTAN SUCCINATE 100 MG PO TAB
ORAL_TABLET | SUBCUTANEOUS | 5 refills | 30.00000 days | Status: AC
Start: 2024-02-26 — End: ?

## 2024-02-26 MED ORDER — NORTRIPTYLINE 50 MG PO CAP
100 mg | ORAL_CAPSULE | Freq: Every evening | ORAL | 1 refills | 30.00000 days | Status: AC
Start: 2024-02-26 — End: ?

## 2024-02-26 MED ORDER — CLONAZEPAM 0.5 MG PO TAB
0.75 mg | ORAL_TABLET | Freq: Two times a day (BID) | ORAL | 2 refills | Status: AC
Start: 2024-02-26 — End: ?

## 2024-02-26 NOTE — Progress Notes
 Date of Service: 02/26/2024    Subjective:             Christina Garrison is a 53 y.o. female w PMH significant for HTN, HLD, vertigo on clonazepam , emphysema, obesity s/p gastric sleeve, OSA on BiPAP, anxiety, PTSD, occipital neuralgia, tension headaches, and migraine, who presents for follow up of her headaches.     History of Present Illness    Headaches have been slightly more frequent lately. Weather is a clear trigger. Being outside in the heat can be a trigger. Light and noise continue to be triggers.  Imitrex  needing once a week. Having more, less severe headaches. Only 5 totally HA free days in the last month.     Wants to talk about memory today. She thinks she is more and more forgetful since January. Cites forgetting where cellphone is twice a day. Forgot dog's name. Asks husband to repeat sometimes. May arrive somewhere and forget the drive. Mood is stable. Sleep has been poorer lately. No red flag symptoms. We discussed that these are all normal and possibly worsening HA are contributing.                Objective:         acetaminophen  (TYLENOL  EXTRA STRENGTH) 500 mg tablet Take one tablet by mouth every 6 hours as needed for Pain. Max of 4,000 mg of acetaminophen  in 24 hours.    biotin 5,000 mcg TbDi Dissolve two tablets by mouth twice daily.    buPROPion  HCL SR (WELLBUTRIN  SR) 100 mg tablet, 12 hr sustained-release Take one tablet by mouth daily.    cholecalciferol (VITAMIN D-3) 1,000 units tablet Take one tablet by mouth twice weekly.    clonazePAM  (KLONOPIN ) 0.5 mg tablet TAKE 1 AND 1/2 TABLETS BY MOUTH TWICE DAILY    cyanocobalamin (vitamin B-12) 2,500 mcg tab Take one tablet by mouth daily with breakfast.    diphenhydrAMINE  HCL (BENADRYL ) 12.5 mg/5 mL oral solution Take 5 mL by mouth every 6 hours as needed.    ezetimibe  (ZETIA ) 10 mg tablet Take one tablet by mouth daily.    famotidine  (PEPCID ) 20 mg tablet Take one tablet by mouth daily as needed.    lamoTRIgine  (LAMICTAL ) 25 mg tablet Take one tablet by mouth at bedtime daily.    multivit, Ca, min-FA-soy isofl 400-60 mcg-mg tab Take 1 tablet by mouth twice daily.    nebivoloL  (BYSTOLIC ) 5 mg tablet TAKE 1 TABLET BY MOUTH EVERY DAY    nicotine  (NICOTROL  NS) 10 mg/mL nasal spray Use 1 spray in each nostril 1-2 times per hour, not to exceed 40 times per day, for up to 12 weeks. (Patient taking differently: as Needed. Use 1 spray in each nostril 1-2 times per hour, not to exceed 40 times per day, for up to 12 weeks.)    NICOTROL  10 mg inhaler INHALE ONE PUFF BY MOUTH INTO THE LUNGS AS NEEDED. PUFF BY MOUTH AS NEEDED. MAY USE 6-16 CARTRIDGES PER DAY AS NEEDED FOR UP TO 6 MONTHS. DO NOT SMOKE WHILE USING INHALER.    nortriptyline  (PAMELOR ) 50 mg capsule Take one capsule by mouth at bedtime daily.    ondansetron  (ZOFRAN  ODT) 4 mg rapid dissolve tablet Dissolve one tablet by mouth every 8 hours as needed for Nausea or Vomiting. Place on tongue to dissolve.    pantoprazole  DR (PROTONIX ) 40 mg tablet TAKE 1 TABLET BY MOUTH EVERY DAY (Patient taking differently: Take one tablet by mouth as Needed.)    Potassium  99 mg tab Take one tablet by mouth three times weekly.    prochlorperazine  maleate (COMPAZINE ) 10 mg tablet Take one tablet by mouth every 6 hours as needed for Nausea or Vomiting.    riboflavin  (vitamin B2) 400 mg tablet Take one tablet by mouth daily.    rosuvastatin  (CRESTOR ) 20 mg tablet Take one tablet by mouth daily.    sertraline  (ZOLOFT ) 100 mg tablet Take one tablet by mouth daily.    SUMAtriptan  succinate (IMITREX ) 100 mg tablet Take one tablet by mouth at onset of headache. May repeat after 2 hours if needed. Max of 200 mg in 24 hours.  Limit use to less than 10 tablets per month.    verapamil  (CALAN ) 40 mg tablet Take 1/2 tablet by mouth in the morning and 1 tablet in the evening (Patient taking differently: Take one tablet by mouth daily. Take 1/2 tablet by mouth in the morning and 1 tablet in the evening)    vitamins, multi w/minerals 9 mg iron-400 mcg tab Take one tablet by mouth daily with breakfast.     There were no vitals filed for this visit.  There is no height or weight on file to calculate BMI.     Physical Exam      General physical exam:    HEENT: normocephalic, eyes open with no discharge, nares patent, oropharynx is clear with no lesions, palate intact  Fundoscopic exam: normal fundi  Chest: equal rise bilaterally with normal WOB on RA  Ab: non-distended  Skin: no obvious rashes or lesions      Neuro exam:   Mental status: alert, appropriate conversation  Speech:    Normal Abnormal   Fluency x    Comprehension x    Articulation x    Repetition     Naming         Cranial Nerves:    Normal Abnormal   II Pupils reactive, visual fields full to finger count    III, IV, VI EOMI, no nystagmus    V Sensation nml V1-V3    VII    Normal facial symmetry    VIII nml to finger rub    IX, X Symmetric palate    XI Equal shoulder shrug    XII Tongue midline        Muscle/motor:   Tone: nml  Bulk: nml     NF NE SA EF EE WE WF FF FE FA TA HF HA HE KF KE DF PF In Ev TF TE   R   5 5 5 5 5   5  5   5 5 5 5        L   5 5 5 5 5   5  5   5 5 5 5            Sensation:    Normal RUE LUE RLE LLE   Light Touch x       Pin Prick        Temperature x       Vibration        Proprioception        Sensory level: none    Coordination:    Normal Abnormal Right Abnormal Left   Finger to Nose x     Rapid alternating       Heel to Shin x     Finger tap      Foot tap      Other  Gait and Station:  Regular gait: nml      Reflexes:    Right Left   Triceps 2 2   Biceps 2 2   Brachioradialis 2 2   Patella 0 (knee replacement) 1   Ankle 2 2   Plantar down down   Other reflexes:  Pectoralis + BL         Assessment and Plan:  Christina Garrison is a 53 y.o. female with a PMH significant for HTN, HLD, vertigo on clonazepam , emphysema, obesity s/p gastric sleeve, OSA on BiPAP, anxiety, PTSD, occipital neuralgia, tension headaches, and migraine, who presents for follow up of her headaches. Chronic Migraine with Aura  Occipital Neuralgia  - Typical migraine: Holoacranial, throbbing, associated with photo and phonophobia and nausea, lasting a few hours. Pressure sensation frontal, bilaterally under the ears, posterior occipital. Usually controlled with sumatriptan  100mg , benadryl  and compazine  cocktail.   -Current frequency- only 5 HA free days last month. Needing imitrex  once weekly. Does not need second dose  -Triggesr-sunlight, loud noises, weather changes  - She has previously tried topiramate  and had intolerable side effects.  She is not an ideal candidate for a beta blocker due to her relative hypotension and her history of emphysema.  She has had an adverse reaction to venlafaxine in the past.  - couldn't tolerate tricyclic due to sedation  Plan:  > Increase nortriptyline  75mg  qhs for two weeks and then can increase to 100mg  qhs   > Continue riboflavin  400 mg daily, she is not taking magnesium  as it is less effective  > Continue sumatriptan  100mg  PRN abortive + benadryl  and compazine   > Can consider a CGRP antagonist in future if breakthrough headaches continue to worsen despite above changes    R Ulnar Neuropathy  R Carpel Tunnel  R Sciatica  - Neuro exam with normal strength, reflexes and sensory exam in b/l LE  - multiple prior surgeries for R Ulnar neuropathy w/o much benefit  - 03/21/23 MRI L spine  1. Mild multilevel lumbar disc bulges and facet arthropathy.   2. 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 Mild bilateral recess stenosis at L4-5. Moderate bilateral foraminal   stenosis at this level.      Vertigo  Her symptoms continue to be well-managed on clonazepam .  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  - Dr.Sachen has filled this for years  Plan:  > Continue clonazepam  0.75 mg BID and 1 mg daily PRN, 6 mo refills        Pt seen and discussed with Dr. Sachen.             Fay Shaker, MD  Neurology PGY2

## 2024-02-29 ENCOUNTER — Encounter: Admit: 2024-02-29 | Discharge: 2024-02-29 | Payer: PRIVATE HEALTH INSURANCE | Primary: General Practice

## 2024-03-12 ENCOUNTER — Encounter: Admit: 2024-03-12 | Discharge: 2024-03-12 | Payer: PRIVATE HEALTH INSURANCE | Primary: General Practice

## 2024-04-04 ENCOUNTER — Ambulatory Visit: Admit: 2024-04-04 | Discharge: 2024-04-05 | Payer: PRIVATE HEALTH INSURANCE | Primary: General Practice

## 2024-04-04 DIAGNOSIS — F341 Dysthymic disorder: Principal | ICD-10-CM

## 2024-04-04 DIAGNOSIS — F4310 Post-traumatic stress disorder, unspecified: Secondary | ICD-10-CM

## 2024-04-04 DIAGNOSIS — F418 Other specified anxiety disorders: Secondary | ICD-10-CM

## 2024-04-04 MED ORDER — LAMOTRIGINE 25 MG PO TAB
ORAL_TABLET | ORAL | 1 refills | 30.00000 days | Status: DC
Start: 2024-04-04 — End: 2024-04-04

## 2024-04-04 MED ORDER — SERTRALINE 100 MG PO TAB
100 mg | ORAL_TABLET | Freq: Every day | ORAL | 1 refills | 30.00000 days | Status: AC
Start: 2024-04-04 — End: ?

## 2024-04-04 MED ORDER — BUPROPION SR 100 MG PO TBSR
100 mg | Freq: Every day | ORAL | 1 refills | 30.00000 days | Status: AC
Start: 2024-04-04 — End: ?

## 2024-04-04 MED ORDER — LAMOTRIGINE 25 MG PO TAB
ORAL_TABLET | ORAL | 1 refills | 30.00000 days | Status: AC
Start: 2024-04-04 — End: ?
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
## 2024-04-04 NOTE — Progress Notes
 ATTENDING NOTE  I saw and evaluated Summerlynn E Goehring via Telehealth with Marsa FORBES Daring, MD and concur with the assessment and treatment plan. Patient is 53 y.o. female with MDD, GAD and PTSD. Psychiatric symptoms well controlled at today's encounter. Denies SI/HI and AVH and no other safety concerns. Pt reports no medication side effects.    PLAN:  The following medication changes were made during this visit to better treat the above symptoms:  Continue Klonopin  1.5mg  PO BID  Continue Nortriptyline  100mg  PO QHS  Continue Zoloft  100mg  PO Daily  Increase Lamictal  to 25mg  PO Q48H and 50mg  PO Q48H.  Continue Wellbutrin  SR 100mg  PO Daily  Recommend Psychotherapy  No labs needed     acetaminophen  (TYLENOL  EXTRA STRENGTH) 500 mg tablet Take one tablet by mouth every 6 hours as needed for Pain. Max of 4,000 mg of acetaminophen  in 24 hours.    biotin 5,000 mcg TbDi Dissolve two tablets by mouth twice daily.    buPROPion  HCL SR (WELLBUTRIN  SR) 100 mg tablet, 12 hr sustained-release Take one tablet by mouth daily.    cholecalciferol (VITAMIN D-3) 1,000 units tablet Take one tablet by mouth twice weekly.    clonazePAM  (KLONOPIN ) 0.5 mg tablet Take 1.5 tablets by mouth twice daily.    cyanocobalamin (vitamin B-12) 2,500 mcg tab Take one tablet by mouth daily with breakfast.    diphenhydrAMINE  HCL (BENADRYL ) 12.5 mg/5 mL oral solution Take 5 mL by mouth every 6 hours as needed.    ezetimibe  (ZETIA ) 10 mg tablet Take one tablet by mouth daily.    famotidine  (PEPCID ) 20 mg tablet Take one tablet by mouth daily as needed.    multivit, Ca, min-FA-soy isofl 400-60 mcg-mg tab Take 1 tablet by mouth twice daily.    nebivoloL  (BYSTOLIC ) 5 mg tablet TAKE 1 TABLET BY MOUTH EVERY DAY    nicotine  (NICOTROL  NS) 10 mg/mL nasal spray Use 1 spray in each nostril 1-2 times per hour, not to exceed 40 times per day, for up to 12 weeks. (Patient taking differently: as Needed. Use 1 spray in each nostril 1-2 times per hour, not to exceed 40 times per day, for up to 12 weeks.)    NICOTROL  10 mg inhaler INHALE ONE PUFF BY MOUTH INTO THE LUNGS AS NEEDED. PUFF BY MOUTH AS NEEDED. MAY USE 6-16 CARTRIDGES PER DAY AS NEEDED FOR UP TO 6 MONTHS. DO NOT SMOKE WHILE USING INHALER.    nortriptyline  (PAMELOR ) 50 mg capsule Take two capsules by mouth at bedtime daily. Increase to 1.5 tabs at night for two weeks and if tolerated increase to 2 tabs nightly    ondansetron  (ZOFRAN  ODT) 4 mg rapid dissolve tablet Dissolve one tablet by mouth every 8 hours as needed for Nausea or Vomiting. Place on tongue to dissolve.    pantoprazole  DR (PROTONIX ) 40 mg tablet TAKE 1 TABLET BY MOUTH EVERY DAY (Patient taking differently: Take one tablet by mouth as Needed.)    Potassium 99 mg tab Take one tablet by mouth three times weekly.    prochlorperazine  maleate (COMPAZINE ) 10 mg tablet Take one tablet by mouth every 6 hours as needed for Nausea or Vomiting.    riboflavin  (vitamin B2) 400 mg tablet Take one tablet by mouth daily.    rosuvastatin  (CRESTOR ) 20 mg tablet TAKE 1 TABLET BY MOUTH EVERY DAY    sertraline  (ZOLOFT ) 100 mg tablet Take one tablet by mouth daily.    SUMAtriptan  succinate (IMITREX ) 100 mg tablet Take one tablet  by mouth at onset of headache. May repeat after 2 hours if needed. Max of 200 mg in 24 hours.  Limit use to less than 10 tablets per month.    verapamil  (CALAN ) 40 mg tablet Take 1/2 tablet by mouth in the morning and 1 tablet in the evening (Patient taking differently: Take one tablet by mouth daily. Take 1/2 tablet by mouth in the morning and 1 tablet in the evening)    vitamins, multi w/minerals 9 mg iron-400 mcg tab Take one tablet by mouth daily with breakfast.       Norman JONELLE Crandall, MD  04/04/2024

## 2024-05-17 ENCOUNTER — Encounter: Admit: 2024-05-17 | Discharge: 2024-05-17 | Payer: PRIVATE HEALTH INSURANCE | Primary: General Practice

## 2024-05-17 DIAGNOSIS — R079 Chest pain, unspecified: Secondary | ICD-10-CM

## 2024-05-17 DIAGNOSIS — I10 Essential (primary) hypertension: Principal | ICD-10-CM

## 2024-05-17 DIAGNOSIS — E785 Hyperlipidemia, unspecified: Secondary | ICD-10-CM

## 2024-05-17 LAB — BASIC METABOLIC PANEL
ANION GAP: 11
BLD UREA NITROGEN: 12
CALCIUM: 9.4
CHLORIDE: 105
CO2: 27
CREATININE: 0.7
GFR ESTIMATED: 97
GLUCOSE,PANEL: 97
POTASSIUM: 4.5
SODIUM: 143

## 2024-05-17 LAB — LIPID PROFILE
CHOLESTEROL/HDL %: 4
CHOLESTEROL: 179
HDL: 51
LDL: 105 — ABNORMAL HIGH (ref <100)
TRIGLYCERIDES: 118
VLDL: 24

## 2024-05-17 LAB — ALT (SGPT): ALT: 20

## 2024-06-13 ENCOUNTER — Encounter: Admit: 2024-06-13 | Discharge: 2024-06-13 | Payer: PRIVATE HEALTH INSURANCE | Primary: General Practice

## 2024-06-17 ENCOUNTER — Encounter: Admit: 2024-06-17 | Discharge: 2024-06-17 | Payer: PRIVATE HEALTH INSURANCE | Primary: General Practice

## 2024-06-17 MED ORDER — CLONAZEPAM 0.5 MG PO TAB
0.75 mg | ORAL_TABLET | Freq: Two times a day (BID) | ORAL | 2 refills | 30.00000 days | Status: AC
Start: 2024-06-17 — End: ?

## 2024-06-24 ENCOUNTER — Encounter: Admit: 2024-06-24 | Discharge: 2024-06-24 | Payer: PRIVATE HEALTH INSURANCE | Primary: General Practice

## 2024-06-27 ENCOUNTER — Ambulatory Visit: Admit: 2024-06-27 | Discharge: 2024-06-28 | Payer: PRIVATE HEALTH INSURANCE | Primary: General Practice

## 2024-06-27 DIAGNOSIS — F341 Dysthymic disorder: Secondary | ICD-10-CM

## 2024-06-27 DIAGNOSIS — F418 Other specified anxiety disorders: Secondary | ICD-10-CM

## 2024-06-27 DIAGNOSIS — F4310 Post-traumatic stress disorder, unspecified: Principal | ICD-10-CM

## 2024-06-27 MED ORDER — LAMOTRIGINE 25 MG PO TAB
50 mg | ORAL_TABLET | Freq: Every evening | ORAL | 1 refills | 30.00000 days | Status: AC
Start: 2024-06-27 — End: ?

## 2024-06-27 MED ORDER — BUPROPION SR 100 MG PO TBSR
100 mg | Freq: Every day | ORAL | 1 refills | 30.00000 days | Status: AC
Start: 2024-06-27 — End: ?

## 2024-06-27 MED ORDER — SERTRALINE 100 MG PO TAB
100 mg | ORAL_TABLET | Freq: Every day | ORAL | 1 refills | 30.00000 days | Status: AC
Start: 2024-06-27 — End: ?
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
## 2024-06-27 NOTE — Patient Instructions [37]
 It was nice to see you in clinic today!     If questions arise after this appointment, you can reach the clinic by calling [phone].    Actions from today's visit:  Continue Sertraline  100 mg daily for mood  Formally increase Lamictal  to 50 mg at night for mood augmentation  Continue Wellbutrin  SR 100 mg daily for mood  Try engaging in mindfulness-based exercises for at least 15 minutes, 3 times per week  Reach out to your sleep medicine clinic regarding a repeat sleep study, I would encourage you to utilize your CPAP machine every night    We will see you back in about 4 months for follow up.    - Dr. Delores    Our clinic is dedicated to making sure your prescriptions are filled in a timely manner during regular business hours (8am-5pm).  If you need a medication refill, please call your pharmacy to request a refill. Please make sure to request refills at least 72 hours in advance of your last dose. Your pharmacy will reach out to us  electronically, which is the preferred method. Please try to refrain from paging the on-call psychiatrist regarding medication refills (including controlled substances), as you may not receive a refill or a full refill at that time. Please note that under no circumstances will controlled substances like benzodiazepines (ie: Clonazepam /Klonopin , lorazepam/Ativan, diazepam/Valium, alprazolam/Xanax) or stimulants (i.e.: Methylphenidate/Concerta, Ritalin, lisdexamfetamine/Vyvanse, Adderall, Focalin) be filled early. It is important that you keep these medications safe because they will  not be refilled if they are lost or stolen without a police report presented to our clinic. Finally, these medications usually require follow-up visits every 3 months.     Our clinic does not do long-term disability paperwork. On very rare circumstances and on a case by case basis we may do FMLA/short-term leave paperwork though we usually do not do this.     Communication via MyChart is appropriate for specific and straightforward concerns such as:   Clarification of medication instructions, questions about medications, reporting intolerable side effects    Refill requests    Review and discussion of lab results    Specific items we have discussed that I have asked for you to reach out to me about       MyChart is in inappropriate modality for:   Significant medication changes not previously discussed (starting or stopping medications)   ________________________________________________________________________________________________________________________________________________________________  If you need to contact the clinic:  Clinic hours: 8 am-5 pm M-F  Clinic phone number: [phone]  Clinic fax number: [phone]  Send MyChart message    For nonemergent concerns, you may reach out to the clinic via MyChart. You should receive a response within 72 business hours from our clinic staff/providers.      Other Resources:  The Compassionate Ear phone line  Peer-run listening service that provides non-crisis supportive listening, coping strategies, information and a reprieve from loneliness or isolation  Phone: [phone]  Open 9a - 9p daily, including holidays  National Alliance on Mental Illness: https://www.nami.org   Substance Abuse and Mental Health Services Administration: birthdayfever.at   National human trafficking hotline: 415-606-3245 or text 302-404-0838  National Domestic Violence Hotline: 1- 276-575-2577    [facility] provides its patients with 24/7 care. If you are concerned about an impending psychiatric emergency (i.e. if you have any concerns about risk of harm to yourself, risk of harm to others or feel unsure about your ability to care for yourself), you may reach out to the overnight psychiatrist  on-call by calling the main [facility] operator line outside of normal business hours ([phone]). If you are unsure if you are experiencing a psychiatric emergency, please ask the operator to page the on-call psychiatrist for clarification.   If you require an immediate response from a health care professional, please do not call the on-call psychiatrist and call 911 directly.      Emergency Resources:  In the event of a safety concern or suicidal thoughts, call 911 or go to the nearest emergency room.   The National Suicide Prevention Lifeline is now 75 as of 03/16/21  The Crisis Text Hotline (text 980 354 5487)     How to get started with therapy:  To start therapy at [facility], you can ask the front desk for a therapy packet; please complete this and return it to the clinic via mail or in person. You will be contacted for scheduling. In person and telehealth options are available,  If you would like to do therapy outside of [facility], there are several options:  Call your health insurance provider and ask for a list of covered providers in your area  [facility])   www.kcanxiety.com  [facility] Program - [employer] and Assessment Services  Phone: 6031237356  https://education.mobcommunity.ch   Sliding scale fees  www.psychologytoday.com  Filters for zip code, issues addressed, insurance, type of therapy, language, sexuality, race, faith  Online options:  sunreplacement.co.uk  https://www.talkspace.com  ________________________________________________________________________________________________________________________________________________________________    [facility] Turning Point Program:  Classes, programs and tools that empower and inspire people affected by chronic or serious illness  Support groups, meditation, creative projects, nutrition groups, exercise, and many others  Every program or class is free of charge and is supported entirely by generous donors - founded in 2001  Online classes/programs available  Register for programs at least 48 hours in advance online, by e-mail, or phone    Website: https://www.kansashealthsystem.com/health-resources/turning-point  Email: Stefano Dames - [email or web address] .[email or web address]  Phone: ([phone]    ERROL Friendly Psychotherapy:  LGBT Affirming Therapy Guild  https://lgbtguild.[facility]  [address], [place]  [place], [place] [address]  [phone]  dexterapartments.fr    [facility]  [address]  [address]na [address]  [place], [place] [address]   [phone]  gourmetrating.dk    Gender Dysphoria/Transgender Counseling:   [facility]  [address], [place], [place] [address]  https://transinstitute.vickey Schmidt [facility] [address], [place], [place] [address]  https://www.k[facility]healthsystem.com/womenshealth/specialties/advanced-reproductive-medicine/transgender-services    Family Counseling:  [facility], [address], [place], [place] [address]  instrumentbanking.com.au    [facility]   [address], [place], [place] [address]   [phone]  https://www.newleafcounseling.org    Community mental health centers:  [facility]  [address] [place]  [place], [place] [address]  Phone: [phone]  http://www.wyandotcenter.org/  [facility]  [address] [place], [place] [address] [facility] office)  [address] [place], [place], [place] [address] ([place] office)  Phone: [phone]  ufofinder.fi  [facility])  [phone] Dr. Gladis Minder Myrna Raddle. Carmen Solan  [place], [place] [address]  [phone]  sodaflavors.dk  [facility])  [address], [place], [place] [address]  Phone: [phone]  https://theguidance-ctr.org/  [facility])  [address], [place], [place] [address]  Phone: [phone]  https://www.tri-countymhs.org/  Ophelia Hover [facility])  [address], [place] [address]  Phone: [phone]  https://barton-williams.info/   [facility] [place])  [address], [place], [place]  [address]; phone: [phone] [facility] office)  [address], [place]  [address]; phone: [phone] [place] office)  https://www.laytoncenter.org/    Intensive Outpatient Programs:   Sleep at home, but have intensive therapy multiple days per week  Treatment for:  Drug or alcohol abuse or addiction   Depression   Stress and anxiety disorders   Trauma and post-traumatic stress disorder (PTSD)   Mood disorders   Personality disorders   [facility]   [address] [place], [place]  [address]  [phone]  https://cottonwoodsprings.com/outpatient-treatment/iop-programs/  [facility]  [address] [place]  [place], [place] [address]  [phone]  eliteclients.be    Child Psychiatry Hospitals:  [facility]  [address] [place], [place] [address]  [phone]  solutionbranding.com.ee    [place]  [address] [place] [address]  https://www.kansashealthsystem.com/medical-services/psychiatry/child-adolescent-psychiatry    [facility]  [address] [place], [place], [address]  physicaldisorder.com.br    Employment Resources:   [employer]  [address], [place], [place] [address]  [phone]; [phone]  https://www.jobcorps.gov/center/[place] -city-satellite-job-corps-center    Department of Vocational Rehab  [address] [place], [place] [address]  [phone]  https://dese.http://www.perkins-white.org/    Full Employment Council- [place]   [address], [place], [place] [address]  [phone]  www.tuxconnect.uy    [employer] [facility] [place]  [address], [place], [place] [address]  https://www.reed-pollard.com/    Women's Employment Network, [place]   [address], [place], [place] [address]  www.kcwen.org    Workforce Partnership   https://www.shaw-carson.com/    Housing Resources:   [employer] [facility]eless  [address] [place], [place] [address]  [phone]  www.artistshelpingthehomeless.[employer]   [address] , [place]  [place], [place] [address]  [phone]   www.eofkck.org    [place]  [facility]  [address], [place], [place] [address]  [phone]  www.greaterkchousinginformationcenter.org    [facility]  [address] [place], [place] [address]  [phone]  www.http://jones-nelson.com/    Domestic Violence Resources:   [employer]  [address]  [place], [place] [address]  [phone]  Localcreditcrunch.at    Metropolitan Organization to [employer] Sexual Assault (MOCSA)  [address], [place], [place] [address]  [phone]; [phone]  www.mocsa.org    [place]  [employer] [address] [place], [place]  [address]  [phone]  www.rosebrooks.org    Veterans:    Chief Executive Officer, [facility]  [address], [place], [place] [address]  [phone]  https://www.jackson-fischer.com/    [place]  [facility]   [address], [place]  [place] [address]  [phone]  https://www.kansascity.fitboxer.tn    [place]  [facility] (substance use treatment)  Substance Abuse Rehabilitation and Treatment Program  [phone]  www.kansascity.fitboxer.tn    [place]  [facility]   [address] [phone]  https://www.vetcenter.fitboxer.tn    Substance Use Treatment:   [facility]  [address], [place], [place] [address]  https://thecmhs.com    Kan Quit (Smoking Cessation)   http://www.kanquit.[facility]  [address] [place], [place] [address]  [phone]  deathunit.nl    [facility] (opioid treatment)  [address],   [place], [place]  [address]  [phone]    [facility]  [address] [place]  [place], [place] [address]  [phone]  https://www.rediscovermh.vickey Jayson RAYMOND Catha Bernerd Salome  [address], [place], [place] [address]  www.rodgershealth.[facility]  [address] [place], [place], [place] [address]  [phone]  https://www.tri-countymhs.org/substance-use/    [facility])   [address] [place], [place] [address]  [phone]  fastfoodlife.com.cy    [facility]  [phone] MICAEL [address] [place], [place]   [phone] or [phone]  www.valleyhope.vickey [facility] Outpatient Substance Use Programs  [address],? [place], [place] and? [address],? [place], [place]  [phone]  http://www.mcclure.com/    [facility])  [address] [place], [place] [address]  [phone]  photosolver.pl    [place]  [facility]  [phone]  greatestfeeling.tn    [facility]  [address] [place]  [place], [place] [address]  [phone]  http://www.wyandotcenter.org/Services/Emergency-and-Stabilizaiton-Services      Here are some non-medication strategies that have been studied to show positive effects on depression and anxiety:  Exercise  Repeatedly shown to have significant benefits for depression, anxiety, memory, sleep  Start with just a little and increase slowly! Goal is to build a habit, not to get a year's worth of exercise in the first few days  Exercise does not need to be intense to obtain mood benefits - 30-45 minutes of brisk walking every other day will provide benefit!  You may consider looking for fitness apps or websites with further information to help you get started. A few to check out include: myfitnesspal, pocket yoga, mapmyrun, fitradio, Instant Heart rate, or [employer] training club.   More information:  https://www.moodtreatmentcenter.com/wp-content/uploads/2020/12/exercise.pdf    Sleep  Most people benefit from getting 7-9 hours of sleep per night  Sleep problems can have many causes and are not always best treated with medication  Sleep can be optimized by practicing good Sleep Hygiene techniques (see list below)  Additional resources:  https://podcasts.apple.com/us /podcast/how-to-sleep/id1547182223?p=8999488936631  rankhunter.fr  mapseats.co.uk  https://www.clevelandclinicwellness.com/pages/GoToSleep.htm    Tips for practicing good sleep hygiene:  Go to bed and get up at the same time every day, including on the weekends and during vacations  If you cannot fall asleep, or you wake up and cannot get back to sleep, get out of bed and participate in a calming activities such as reading, sketching, or listening to soft music  Make sure that your bedroom is quiet, dark, relaxing, and at a comfortable temperature  Remove electronic devices such as TVs, computers, and smart phones from the bedroom.  If you can, avoid looking at any screen with blue light for up to 1 hour prior to trying to go to sleep  Exercise and being physically active during the day can be helpful for getting good sleep at night  Keep a sleep diary: record things such as when you go to bed, when you wake up, how long it took to fall asleep, how rested you feel, and any medications used  Establish a relaxing bedtime routine and do it consistently  Avoid eating large meals before bedtime.  If you are hungry at night, eat a light healthy snack  Avoid consuming caffeine after 12:00pm  Avoid consuming alcohol, nicotine , and THC before bedtime  Reduce your fluid intake before bedtime  Most adults 41 years old and older  are recommended to get between 7 and 9 hours of sleep nightly    Consider downloading the CBT-I Coach free smartphone app. It is designed by [facility] and the [facility] and is backed by research.   Consider the [facility]'s sleep program: Go To Sleep! https://www.clevelandclinicwellness.com/pages/GoToSleep.htm    Antidepressant Diet  Based on being simple, sustainable, and effective  Great for eating healthier, weight loss, and improving your mood!  Link:  https://www.moodtreatmentcenter.com/wp-content/uploads/2022/11/antidepressantdiet.pdf    Omega-3 Fatty Acids  Can be obtained in diet or via supplements  Help with depression, irritability, ADHD, memory, schizophrenia, autism, borderline personality disorder  Lower the risk of cancer, stroke, osteoporosis, psoriasis, inflammatory bowel disease, liver disease, macular degeneration, and asthma  Link:  https://www.moodtreatmentcenter.com/wp-content/uploads/2021/11/omega3.pdf    Light Box Therapy  Light boxes can be used for seasonal depression  They are generally safe, though should be used with caution in Bipolar disorder (talk to your doctor first!)  Best used first thing in the morning, do not use after 2:30pm  Sit ~12 inches from the box for 1-2 hours; you can do other things while using the box, such as reading or using a laptop  They are available on Amazon.com for $30-50; Recommended specifications:  Intensity: 10,000 lux is optimal  Screen Size: at least 12 x 17 inches  Wavelength: around 509 nM (White Light)

## 2024-06-27 NOTE — Progress Notes [1]
 ATTENDING NOTE  I discussed Christina Garrison with Marsa FORBES Daring, MD and concur with the assessment and treatment plan. Patient is 53 y.o. female with MDD, PDD, GAD, and PTSD. Psychiatric symptoms well controlled at today's encounter. Denies SI/HI and AVH and no other safety concerns. Pt reports no medication side effects.    PLAN:  The following medications will be continued for the above symptoms:  Continue Klonopin  1.5mg  PO BID  Continue Nortriptyline  100mg  PO QHS  Continue Zoloft  100mg  PO Daily  Increase Lamictal  to 50mg  PO QHS  Continue Wellbutrin  SR 100mg  PO Daily  Recommend Psychotherapy  No labs needed     acetaminophen  (TYLENOL  EXTRA STRENGTH) 500 mg tablet Take one tablet by mouth every 6 hours as needed for Pain. Max of 4,000 mg of acetaminophen  in 24 hours.    biotin 5,000 mcg TbDi Dissolve two tablets by mouth twice daily.    buPROPion  HCL SR (WELLBUTRIN  SR) 100 mg tablet, 12 hr sustained-release Take one tablet by mouth daily.    cholecalciferol (VITAMIN D-3) 1,000 units tablet Take one tablet by mouth twice weekly.    clonazePAM  (KLONOPIN ) 0.5 mg tablet Take 1.5 tablets by mouth twice daily.    cyanocobalamin (vitamin B-12) 2,500 mcg tab Take one tablet by mouth daily with breakfast.    diphenhydrAMINE  HCL (BENADRYL ) 12.5 mg/5 mL oral solution Take 5 mL by mouth every 6 hours as needed.    ezetimibe  (ZETIA ) 10 mg tablet Take one tablet by mouth daily.    famotidine  (PEPCID ) 20 mg tablet Take one tablet by mouth daily as needed.    multivit, Ca, min-FA-soy isofl 400-60 mcg-mg tab Take 1 tablet by mouth twice daily.    nebivoloL  (BYSTOLIC ) 5 mg tablet TAKE 1 TABLET BY MOUTH EVERY DAY    nortriptyline  (PAMELOR ) 50 mg capsule Take two capsules by mouth at bedtime daily. Increase to 1.5 tabs at night for two weeks and if tolerated increase to 2 tabs nightly    ondansetron  (ZOFRAN  ODT) 4 mg rapid dissolve tablet Dissolve one tablet by mouth every 8 hours as needed for Nausea or Vomiting. Place on tongue to dissolve.    pantoprazole  DR (PROTONIX ) 40 mg tablet TAKE 1 TABLET BY MOUTH EVERY DAY (Patient taking differently: Take one tablet by mouth as Needed.)    Potassium 99 mg tab Take one tablet by mouth three times weekly.    prochlorperazine  maleate (COMPAZINE ) 10 mg tablet Take one tablet by mouth every 6 hours as needed for Nausea or Vomiting.    riboflavin  (vitamin B2) 400 mg tablet Take one tablet by mouth daily.    rosuvastatin  (CRESTOR ) 20 mg tablet TAKE 1 TABLET BY MOUTH EVERY DAY    sertraline  (ZOLOFT ) 100 mg tablet Take one tablet by mouth daily.    SUMAtriptan  succinate (IMITREX ) 100 mg tablet Take one tablet by mouth at onset of headache. May repeat after 2 hours if needed. Max of 200 mg in 24 hours.  Limit use to less than 10 tablets per month.    verapamil  (CALAN ) 40 mg tablet Take 1/2 tablet by mouth in the morning and 1 tablet in the evening (Patient taking differently: Take one tablet by mouth daily. Take 1/2 tablet by mouth in the morning and 1 tablet in the evening)    vitamins, multi w/minerals 9 mg iron-400 mcg tab Take one tablet by mouth daily with breakfast.       Norman JONELLE Crandall, MD  06/27/2024

## 2024-07-29 ENCOUNTER — Encounter: Admit: 2024-07-29 | Discharge: 2024-07-29 | Payer: PRIVATE HEALTH INSURANCE | Primary: General Practice

## 2024-08-14 ENCOUNTER — Encounter: Admit: 2024-08-14 | Discharge: 2024-08-14 | Payer: PRIVATE HEALTH INSURANCE | Primary: General Practice

## 2024-08-25 ENCOUNTER — Encounter: Admit: 2024-08-25 | Discharge: 2024-08-25 | Payer: PRIVATE HEALTH INSURANCE | Primary: General Practice

## 2024-09-07 ENCOUNTER — Encounter: Admit: 2024-09-07 | Discharge: 2024-09-07 | Payer: PRIVATE HEALTH INSURANCE | Primary: General Practice

## 2024-09-08 ENCOUNTER — Encounter: Admit: 2024-09-08 | Discharge: 2024-09-08 | Payer: PRIVATE HEALTH INSURANCE | Primary: General Practice

## 2024-09-09 ENCOUNTER — Encounter: Admit: 2024-09-09 | Discharge: 2024-09-09 | Payer: PRIVATE HEALTH INSURANCE | Primary: General Practice

## 2024-09-09 ENCOUNTER — Ambulatory Visit: Admit: 2024-09-09 | Discharge: 2024-09-10 | Payer: PRIVATE HEALTH INSURANCE | Primary: General Practice

## 2024-09-09 DIAGNOSIS — R419 Unspecified symptoms and signs involving cognitive functions and awareness: Secondary | ICD-10-CM

## 2024-09-09 DIAGNOSIS — G4486 Cervicogenic headache: Secondary | ICD-10-CM

## 2024-09-09 DIAGNOSIS — R42 Dizziness and giddiness: Secondary | ICD-10-CM

## 2024-09-09 DIAGNOSIS — G43109 Migraine with aura, not intractable, without status migrainosus: Principal | ICD-10-CM

## 2024-09-09 MED ORDER — CLONAZEPAM 0.5 MG PO TAB
ORAL_TABLET | ORAL | 2 refills | 30.00000 days | Status: AC
Start: 2024-09-09 — End: ?

## 2024-09-09 MED ORDER — SUMATRIPTAN SUCCINATE 100 MG PO TAB
ORAL_TABLET | SUBCUTANEOUS | 5 refills | 30.00000 days | Status: AC
Start: 2024-09-09 — End: ?

## 2024-09-09 MED ORDER — NORTRIPTYLINE 50 MG PO CAP
100 mg | ORAL_CAPSULE | Freq: Every evening | ORAL | 3 refills | 30.00000 days | Status: AC
Start: 2024-09-09 — End: ?

## 2024-09-09 MED ORDER — RIBOFLAVIN (VITAMIN B2) 400 MG PO TAB
400 mg | ORAL_TABLET | Freq: Every day | ORAL | 3 refills | Status: AC
Start: 2024-09-09 — End: ?

## 2024-09-09 NOTE — Assessment & Plan Note [38]
 This is occurring very sparingly for several years now and we have concerns that her Klonopin  is contributing to her other more debilitating complaints. She seems to be relying on it more for assistance with mood.    Would like to try tapering off but would hate to abruptly worsen depression without her psychiatry team being aware of our plans. Will work together to determine best next steps.

## 2024-09-09 NOTE — Progress Notes [1]
 Date of Service: 09/09/2024    Subjective:             Christina Garrison is a 54 y.o. female with PMH of HTN, HLD, vertigo, emphysema, obesity s/p gastric sleeve, OSA, anxiety, depression, PTSD, occipital neuralgia, migraine, who presents for follow up of her headaches.     History of Present Illness    Rashaun presents for follow up via telehealth. She stands outside the front of the house because she has 11 dogs inside. She has had headache every day for the last 3 weeks, a more severe migraine lasting two days that was worse than her usual, making her feel nauseous and dizzy, worse at night. For a few days prior to this she had a headache behind the eyes which is different for her. It seemed to wrap over the top of both sides of her head. There is some pain in the neck as well which she often has with headaches. She wakes up with headaches and they rarely wake her from sleep.  She admits she rarely uses her CPAP. She did not notice any change in headaches with increasing nortriptyline  prior to this more acute change. Her abortive cocktail is still effective for her. She recently got new glasses and notices issues with fuzzy words when reading or fuzzy things on the TV, but not with all aspects of vision. Denies double vision, positional changes, pulsatile tinnitus. Every once in a while she gets dizzy but nothing like I used to. She still feels like she struggles with short term memory. Can't remember the name of a new niece, did she take her medicine, how did she drive to X location, etc. She feels Klonopin  helps calm down her brain, joking about a running hamster wheel. She takes 1mg  in the morning and 0.5mg  at night which helps her sleep. We talked at length about several options for her headaches, and that we feel cognitive concerns and HA may be worsened by Klonopin  use. We decided to make a plan with us , psychiatry, and Kerline for a taper. She has reservations about trying a CGRP and would instead be open to trying Botox for better headache control.                Objective:        Objective    acetaminophen  (TYLENOL  EXTRA STRENGTH) 500 mg tablet Take one tablet by mouth every 6 hours as needed for Pain. Max of 4,000 mg of acetaminophen  in 24 hours.    biotin 5,000 mcg TbDi Dissolve two tablets by mouth twice daily.    buPROPion  HCL SR (WELLBUTRIN  SR) 100 mg tablet, 12 hr sustained-release Take one tablet by mouth daily.    cholecalciferol (VITAMIN D-3) 1,000 units tablet Take one tablet by mouth twice weekly.    clonazePAM  (KLONOPIN ) 0.5 mg tablet 1mg  qam and 0.5 mg qpm    cyanocobalamin (vitamin B-12) 2,500 mcg tab Take one tablet by mouth daily with breakfast.    diphenhydrAMINE  HCL (BENADRYL ) 12.5 mg/5 mL oral solution Take 5 mL by mouth every 6 hours as needed.    ezetimibe  (ZETIA ) 10 mg tablet Take one tablet by mouth daily.    famotidine  (PEPCID ) 20 mg tablet Take one tablet by mouth daily as needed.    lamoTRIgine  (LAMICTAL ) 25 mg tablet Take two tablets by mouth at bedtime daily.    multivit, Ca, min-FA-soy isofl 400-60 mcg-mg tab Take 1 tablet by mouth twice daily.    nebivoloL  (BYSTOLIC ) 5  mg tablet TAKE 1 TABLET BY MOUTH EVERY DAY    nortriptyline  (PAMELOR ) 50 mg capsule Take two capsules by mouth at bedtime daily. Increase to 1.5 tabs at night for two weeks and if tolerated increase to 2 tabs nightly  Indications: migraine    ondansetron  (ZOFRAN  ODT) 4 mg rapid dissolve tablet Dissolve one tablet by mouth every 8 hours as needed for Nausea or Vomiting. Place on tongue to dissolve.    pantoprazole  DR (PROTONIX ) 40 mg tablet TAKE 1 TABLET BY MOUTH EVERY DAY (Patient taking differently: Take one tablet by mouth as Needed.)    Potassium 99 mg tab Take one tablet by mouth three times weekly.    prochlorperazine  maleate (COMPAZINE ) 10 mg tablet Take one tablet by mouth every 6 hours as needed for Nausea or Vomiting.    riboflavin  (vitamin B2) 400 mg tablet Take one tablet by mouth daily.    rosuvastatin  (CRESTOR ) 20 mg tablet TAKE 1 TABLET BY MOUTH EVERY DAY    sertraline  (ZOLOFT ) 100 mg tablet Take one tablet by mouth daily.    SUMAtriptan  succinate (IMITREX ) 100 mg tablet Take one tablet by mouth at onset of headache. May repeat after 2 hours if needed. Max of 200 mg in 24 hours.  Limit use to less than 10 tablets per month.    verapamil  (CALAN ) 40 mg tablet Take one tablet by mouth daily.    vitamins, multi w/minerals 9 mg iron-400 mcg tab Take one tablet by mouth daily with breakfast.     There were no vitals filed for this visit.  There is no height or weight on file to calculate BMI.   Physical Exam     Upper chest and face visible via phone video  Provides thorough history  CN seem grossly intact     Assessment and Plan:    Problem   Cervicogenic Headache   Cognitive Complaints   Migraine Without Aura and Without Status Migrainosus, Not Intractable    - Typical migraine: Holoacranial, throbbing, associated with photo and phonophobia and nausea, lasting a few hours. Pressure sensation frontal, bilaterally under the ears, posterior occipital.   - Abortive regimen: sumatriptan  100mg , benadryl  and compazine  cocktail; currently effective, needing 1-2xweekly. Does not need second dose  -Current frequency- 0-5 headache free days in the last several months. Will have more severe migraines on top of these 2-3d weekly  -Triggers-sunlight, loud noises, weather changes  - She has previously tried topiramate  and had intolerable side effects.  She is not an ideal candidate for a beta blocker due to her relative hypotension and her history of emphysema currently on bystolic .  She has had an adverse reaction to venlafaxine in the past, couldn't tolerate tricyclic due to sedation, Depakote  750mg  for HA/mood without improvement, verapamil  40mg  no improvement, sertraline  100mg  no improvement, lamictal  50mg  no improvement, occipital nerve blocks only brief relief x several rounds  - New headache type noted at Jan 2026 appt, worse in mornings, bifrontal with pain behind the eyes, ?blurry vision at times despite recent ophthalmology eval            Vertigo    Spells for years of intermittent dizziness, rarely with vertigo, initially positionally provoked, Initially with feeling of L ear fullness and pain. Now occurring rarely  MRI wo/w + IAC 10/2012 without significant abnormalities  Neuro-otology workup with likely central cause of dizziness (normal audiogram and mostly normal VEMPs, normal electronystagmography, no canal dehiscence on imaging)  Has undergone PT for vestibular and cervical  causes  EEG (routine) normal    Current treatment: Clonazepam  1mg  qAM, 0.5mg  qPM    Prior trials: Prozac (SE), Effexor (suicidal thoughts), Topamax  (speech difficulties), nortriptyline , Indomethacin (helped headache but GI SE), Depakote , Acupuncture, Magnesium /Riboflavin /coQ10, Propranolol (changed to Bystolic ), Verapamil .  Occipital block with poor tolerance due to lasting pain at injection site.                Migraine without aura and without status migrainosus, not intractable  She is having 25-30 headache days per month despite maximizing therapies and has failed many alternatives in the past as above. She needs better preventative control with plan as below.     Plan:  > Continue nortriptyline  100mg  qhs   > Continue riboflavin  400 mg daily  > Continue sumatriptan  100mg  PRN abortive + benadryl  and compazine   > She is hesitant to start a CGRP but is open to trying Botox injections, meets criteria for this and will start auth process  > Schedule 3d of Triptan bridge to try to break current HA cycle  > Obtain MRI head w/wo and MRV w/wo for change in quality, IIH screen based on symptoms  > Encouraged compliance with CPAP as I think this is contributing, may need to follow up with sleep medicine sooner to ask if [facility] is an option for her  > No concerns for classic medication overuse culprits (NSAIDs, Tylenol ) but suspect her daily Klonopin  use may be contributing. Sent note to psychiatrist Dr. Marolyn Daring about possible taper plans.      Vertigo  This is occurring very sparingly for several years now and we have concerns that her Klonopin  is contributing to her other more debilitating complaints. She seems to be relying on it more for assistance with mood.    Would like to try tapering off but would hate to abruptly worsen depression without her psychiatry team being aware of our plans. Will work together to determine best next steps. For now, a 90d script of her current dose is sent to pharmacy.    RTC in 6 months for follow up appt.     Seen and discussed with Dr. Conda Fay Shaker, MD  Neurology PGY3    I spent >40 minutes with more than 50% of my time as face to face patient contact

## 2024-09-12 ENCOUNTER — Encounter: Admit: 2024-09-12 | Discharge: 2024-09-12 | Payer: PRIVATE HEALTH INSURANCE | Primary: General Practice

## 2024-09-12 DIAGNOSIS — G43709 Chronic migraine without aura, not intractable, without status migrainosus: Principal | ICD-10-CM

## 2024-09-13 ENCOUNTER — Encounter: Admit: 2024-09-13 | Discharge: 2024-09-13 | Payer: PRIVATE HEALTH INSURANCE | Primary: General Practice

## 2024-09-27 ENCOUNTER — Encounter: Admit: 2024-09-27 | Discharge: 2024-09-27 | Payer: PRIVATE HEALTH INSURANCE | Primary: General Practice

## 2024-09-30 ENCOUNTER — Encounter: Admit: 2024-09-30 | Discharge: 2024-09-30 | Payer: PRIVATE HEALTH INSURANCE | Primary: General Practice
